# Patient Record
Sex: Male | Born: 1935 | Race: White | Hispanic: No | Marital: Married | Smoking: Former smoker
Health system: Southern US, Community
[De-identification: ages and names within clinical notes are randomized; demographics above are authoritative.]

## PROBLEM LIST (undated history)

## (undated) DIAGNOSIS — E785 Hyperlipidemia, unspecified: Secondary | ICD-10-CM

## (undated) DIAGNOSIS — G4733 Obstructive sleep apnea (adult) (pediatric): Secondary | ICD-10-CM

## (undated) DIAGNOSIS — S065XAA Traumatic subdural hemorrhage with loss of consciousness status unknown, initial encounter: Secondary | ICD-10-CM

## (undated) DIAGNOSIS — I639 Cerebral infarction, unspecified: Secondary | ICD-10-CM

## (undated) DIAGNOSIS — I4891 Unspecified atrial fibrillation: Secondary | ICD-10-CM

## (undated) DIAGNOSIS — I2510 Atherosclerotic heart disease of native coronary artery without angina pectoris: Secondary | ICD-10-CM

## (undated) DIAGNOSIS — H269 Unspecified cataract: Secondary | ICD-10-CM

## (undated) DIAGNOSIS — E119 Type 2 diabetes mellitus without complications: Secondary | ICD-10-CM

## (undated) DIAGNOSIS — I255 Ischemic cardiomyopathy: Secondary | ICD-10-CM

## (undated) DIAGNOSIS — I5022 Chronic systolic (congestive) heart failure: Secondary | ICD-10-CM

## (undated) DIAGNOSIS — S065X9A Traumatic subdural hemorrhage with loss of consciousness of unspecified duration, initial encounter: Secondary | ICD-10-CM

## (undated) DIAGNOSIS — E669 Obesity, unspecified: Secondary | ICD-10-CM

## (undated) DIAGNOSIS — I10 Essential (primary) hypertension: Secondary | ICD-10-CM

## (undated) HISTORY — DX: Traumatic subdural hemorrhage with loss of consciousness of unspecified duration, initial encounter: S06.5X9A

## (undated) HISTORY — PX: CHOLECYSTECTOMY: SHX55

## (undated) HISTORY — DX: Essential (primary) hypertension: I10

## (undated) HISTORY — DX: Ischemic cardiomyopathy: I25.5

## (undated) HISTORY — DX: Obesity, unspecified: E66.9

## (undated) HISTORY — PX: NASAL SEPTUM SURGERY: SHX37

## (undated) HISTORY — DX: Unspecified cataract: H26.9

## (undated) HISTORY — DX: Atherosclerotic heart disease of native coronary artery without angina pectoris: I25.10

## (undated) HISTORY — PX: CORONARY ARTERY BYPASS GRAFT: SHX141

## (undated) HISTORY — PX: HEMORRHOID SURGERY: SHX153

## (undated) HISTORY — PX: OTHER SURGICAL HISTORY: SHX169

## (undated) HISTORY — DX: Cerebral infarction, unspecified: I63.9

## (undated) HISTORY — DX: Hyperlipidemia, unspecified: E78.5

## (undated) HISTORY — DX: Traumatic subdural hemorrhage with loss of consciousness status unknown, initial encounter: S06.5XAA

## (undated) HISTORY — DX: Type 2 diabetes mellitus without complications: E11.9

## (undated) HISTORY — PX: BRAIN SURGERY: SHX531

---

## 1987-10-04 ENCOUNTER — Encounter: Payer: Self-pay | Admitting: Gastroenterology

## 1992-11-20 ENCOUNTER — Encounter: Payer: Self-pay | Admitting: Gastroenterology

## 1999-01-03 ENCOUNTER — Encounter: Payer: Self-pay | Admitting: General Surgery

## 1999-01-04 ENCOUNTER — Ambulatory Visit (HOSPITAL_COMMUNITY): Admission: RE | Admit: 1999-01-04 | Discharge: 1999-01-04 | Payer: Self-pay | Admitting: General Surgery

## 2000-08-20 ENCOUNTER — Encounter: Payer: Self-pay | Admitting: Orthopedic Surgery

## 2000-08-22 ENCOUNTER — Ambulatory Visit (HOSPITAL_COMMUNITY): Admission: RE | Admit: 2000-08-22 | Discharge: 2000-08-22 | Payer: Self-pay | Admitting: Cardiovascular Disease

## 2000-08-25 ENCOUNTER — Inpatient Hospital Stay (HOSPITAL_COMMUNITY): Admission: AD | Admit: 2000-08-25 | Discharge: 2000-08-29 | Payer: Self-pay | Admitting: Orthopedic Surgery

## 2000-12-02 ENCOUNTER — Encounter: Admission: RE | Admit: 2000-12-02 | Discharge: 2001-03-02 | Payer: Self-pay | Admitting: Pulmonary Disease

## 2003-08-29 ENCOUNTER — Inpatient Hospital Stay (HOSPITAL_COMMUNITY): Admission: RE | Admit: 2003-08-29 | Discharge: 2003-09-02 | Payer: Self-pay | Admitting: Orthopedic Surgery

## 2004-04-24 ENCOUNTER — Ambulatory Visit: Payer: Self-pay | Admitting: Pulmonary Disease

## 2004-07-23 ENCOUNTER — Ambulatory Visit: Payer: Self-pay | Admitting: Pulmonary Disease

## 2004-08-02 ENCOUNTER — Ambulatory Visit: Payer: Self-pay | Admitting: Pulmonary Disease

## 2004-11-20 ENCOUNTER — Ambulatory Visit: Payer: Self-pay | Admitting: Pulmonary Disease

## 2005-03-19 ENCOUNTER — Ambulatory Visit: Payer: Self-pay | Admitting: Pulmonary Disease

## 2005-03-29 ENCOUNTER — Ambulatory Visit: Payer: Self-pay | Admitting: Pulmonary Disease

## 2005-07-16 ENCOUNTER — Ambulatory Visit: Payer: Self-pay | Admitting: Pulmonary Disease

## 2005-11-14 ENCOUNTER — Ambulatory Visit: Payer: Self-pay | Admitting: Pulmonary Disease

## 2005-11-15 ENCOUNTER — Ambulatory Visit: Payer: Self-pay | Admitting: Pulmonary Disease

## 2005-11-15 ENCOUNTER — Ambulatory Visit (HOSPITAL_COMMUNITY): Admission: RE | Admit: 2005-11-15 | Discharge: 2005-11-15 | Payer: Self-pay | Admitting: Pulmonary Disease

## 2006-01-13 ENCOUNTER — Ambulatory Visit: Payer: Self-pay | Admitting: Pulmonary Disease

## 2006-01-28 ENCOUNTER — Encounter: Admission: RE | Admit: 2006-01-28 | Discharge: 2006-01-28 | Payer: Self-pay | Admitting: Neurology

## 2006-04-01 ENCOUNTER — Ambulatory Visit: Payer: Self-pay | Admitting: Pulmonary Disease

## 2006-08-05 ENCOUNTER — Ambulatory Visit: Payer: Self-pay | Admitting: Pulmonary Disease

## 2006-08-05 LAB — CONVERTED CEMR LAB
Albumin: 3.4 g/dL — ABNORMAL LOW (ref 3.5–5.2)
Calcium: 9.1 mg/dL (ref 8.4–10.5)
Creatinine, Ser: 1.1 mg/dL (ref 0.4–1.5)
GFR calc non Af Amer: 69 mL/min
Hgb A1c MFr Bld: 7 % — ABNORMAL HIGH (ref 4.6–6.0)
PSA: 0.76 ng/mL (ref 0.10–4.00)
Potassium: 4.7 meq/L (ref 3.5–5.1)
Sodium: 143 meq/L (ref 135–145)

## 2006-09-16 ENCOUNTER — Ambulatory Visit: Payer: Self-pay | Admitting: Internal Medicine

## 2006-09-17 ENCOUNTER — Ambulatory Visit: Payer: Self-pay | Admitting: Gastroenterology

## 2006-10-02 ENCOUNTER — Ambulatory Visit: Payer: Self-pay | Admitting: Gastroenterology

## 2006-10-08 ENCOUNTER — Ambulatory Visit: Payer: Self-pay | Admitting: Gastroenterology

## 2006-10-08 LAB — CONVERTED CEMR LAB
OCCULT 2: NEGATIVE
OCCULT 5: NEGATIVE

## 2006-12-01 ENCOUNTER — Ambulatory Visit: Payer: Self-pay | Admitting: Pulmonary Disease

## 2006-12-01 LAB — CONVERTED CEMR LAB
BUN: 14 mg/dL (ref 6–23)
CO2: 29 meq/L (ref 19–32)
Calcium: 9.4 mg/dL (ref 8.4–10.5)
GFR calc Af Amer: 93 mL/min
GFR calc non Af Amer: 77 mL/min
Potassium: 4.6 meq/L (ref 3.5–5.1)

## 2007-05-11 ENCOUNTER — Ambulatory Visit: Payer: Self-pay | Admitting: Pulmonary Disease

## 2007-06-08 DIAGNOSIS — G4733 Obstructive sleep apnea (adult) (pediatric): Secondary | ICD-10-CM

## 2007-06-08 DIAGNOSIS — J329 Chronic sinusitis, unspecified: Secondary | ICD-10-CM | POA: Insufficient documentation

## 2007-06-08 DIAGNOSIS — Z860100 Personal history of colon polyps, unspecified: Secondary | ICD-10-CM | POA: Insufficient documentation

## 2007-06-08 DIAGNOSIS — E1129 Type 2 diabetes mellitus with other diabetic kidney complication: Secondary | ICD-10-CM | POA: Insufficient documentation

## 2007-06-08 DIAGNOSIS — E7800 Pure hypercholesterolemia, unspecified: Secondary | ICD-10-CM

## 2007-06-08 DIAGNOSIS — J309 Allergic rhinitis, unspecified: Secondary | ICD-10-CM | POA: Insufficient documentation

## 2007-06-09 ENCOUNTER — Ambulatory Visit: Payer: Self-pay | Admitting: Pulmonary Disease

## 2007-06-09 DIAGNOSIS — G319 Degenerative disease of nervous system, unspecified: Secondary | ICD-10-CM

## 2007-06-09 DIAGNOSIS — M1990 Unspecified osteoarthritis, unspecified site: Secondary | ICD-10-CM | POA: Insufficient documentation

## 2007-06-12 ENCOUNTER — Ambulatory Visit: Payer: Self-pay | Admitting: Pulmonary Disease

## 2007-06-21 DIAGNOSIS — I10 Essential (primary) hypertension: Secondary | ICD-10-CM

## 2007-06-21 LAB — CONVERTED CEMR LAB
Basophils Relative: 1.1 % — ABNORMAL HIGH (ref 0.0–1.0)
Bilirubin, Direct: 0.2 mg/dL (ref 0.0–0.3)
CO2: 30 meq/L (ref 19–32)
Cholesterol: 114 mg/dL (ref 0–200)
Creatinine, Ser: 1 mg/dL (ref 0.4–1.5)
HCT: 43.1 % (ref 39.0–52.0)
Hemoglobin: 14.2 g/dL (ref 13.0–17.0)
Hgb A1c MFr Bld: 6.9 % — ABNORMAL HIGH (ref 4.6–6.0)
LDL Cholesterol: 54 mg/dL (ref 0–99)
Lymphocytes Relative: 33.1 % (ref 12.0–46.0)
MCHC: 33 g/dL (ref 30.0–36.0)
Monocytes Absolute: 0.5 10*3/uL (ref 0.2–0.7)
Neutro Abs: 2.8 10*3/uL (ref 1.4–7.7)
Neutrophils Relative %: 54 % (ref 43.0–77.0)
PSA: 0.83 ng/mL (ref 0.10–4.00)
Potassium: 4.7 meq/L (ref 3.5–5.1)
RDW: 13.4 % (ref 11.5–14.6)
Sodium: 141 meq/L (ref 135–145)
Total Bilirubin: 0.7 mg/dL (ref 0.3–1.2)
Total Protein: 7.3 g/dL (ref 6.0–8.3)
VLDL: 13 mg/dL (ref 0–40)

## 2007-06-23 ENCOUNTER — Telehealth: Payer: Self-pay | Admitting: Pulmonary Disease

## 2007-08-07 ENCOUNTER — Telehealth: Payer: Self-pay | Admitting: Internal Medicine

## 2007-09-14 DIAGNOSIS — K5730 Diverticulosis of large intestine without perforation or abscess without bleeding: Secondary | ICD-10-CM | POA: Insufficient documentation

## 2007-09-14 DIAGNOSIS — Z8719 Personal history of other diseases of the digestive system: Secondary | ICD-10-CM

## 2007-09-17 ENCOUNTER — Encounter: Payer: Self-pay | Admitting: Pulmonary Disease

## 2007-10-12 ENCOUNTER — Telehealth: Payer: Self-pay | Admitting: Pulmonary Disease

## 2007-11-02 ENCOUNTER — Encounter: Payer: Self-pay | Admitting: Pulmonary Disease

## 2007-11-09 ENCOUNTER — Ambulatory Visit: Payer: Self-pay | Admitting: Pulmonary Disease

## 2007-11-27 ENCOUNTER — Encounter: Payer: Self-pay | Admitting: Pulmonary Disease

## 2007-12-16 LAB — CONVERTED CEMR LAB
AST: 30 units/L (ref 0–37)
Alkaline Phosphatase: 45 units/L (ref 39–117)
Bilirubin, Direct: 0.1 mg/dL (ref 0.0–0.3)
Total Bilirubin: 0.9 mg/dL (ref 0.3–1.2)
Total CHOL/HDL Ratio: 2.6

## 2008-01-12 ENCOUNTER — Ambulatory Visit: Payer: Self-pay | Admitting: Pulmonary Disease

## 2008-01-16 LAB — CONVERTED CEMR LAB
BUN: 26 mg/dL — ABNORMAL HIGH (ref 6–23)
CO2: 26 meq/L (ref 19–32)
Calcium: 9.4 mg/dL (ref 8.4–10.5)
Creatinine, Ser: 1 mg/dL (ref 0.4–1.5)
Glucose, Bld: 87 mg/dL (ref 70–99)
Sodium: 140 meq/L (ref 135–145)

## 2008-03-05 ENCOUNTER — Inpatient Hospital Stay (HOSPITAL_COMMUNITY): Admission: EM | Admit: 2008-03-05 | Discharge: 2008-04-01 | Payer: Self-pay | Admitting: Emergency Medicine

## 2008-03-05 ENCOUNTER — Ambulatory Visit: Payer: Self-pay | Admitting: Internal Medicine

## 2008-03-08 ENCOUNTER — Ambulatory Visit: Payer: Self-pay | Admitting: Gastroenterology

## 2008-03-10 ENCOUNTER — Ambulatory Visit: Payer: Self-pay | Admitting: Critical Care Medicine

## 2008-03-28 ENCOUNTER — Ambulatory Visit: Payer: Self-pay | Admitting: Physical Medicine & Rehabilitation

## 2008-04-01 ENCOUNTER — Inpatient Hospital Stay (HOSPITAL_COMMUNITY)
Admission: RE | Admit: 2008-04-01 | Discharge: 2008-04-20 | Payer: Self-pay | Admitting: Physical Medicine & Rehabilitation

## 2008-04-01 ENCOUNTER — Encounter: Payer: Self-pay | Admitting: Pulmonary Disease

## 2008-05-03 ENCOUNTER — Encounter: Payer: Self-pay | Admitting: Pulmonary Disease

## 2008-05-16 ENCOUNTER — Encounter: Payer: Self-pay | Admitting: Pulmonary Disease

## 2008-05-24 ENCOUNTER — Telehealth: Payer: Self-pay | Admitting: Pulmonary Disease

## 2008-05-24 ENCOUNTER — Encounter: Payer: Self-pay | Admitting: Pulmonary Disease

## 2008-05-27 ENCOUNTER — Telehealth (INDEPENDENT_AMBULATORY_CARE_PROVIDER_SITE_OTHER): Payer: Self-pay | Admitting: *Deleted

## 2008-06-09 ENCOUNTER — Encounter: Payer: Self-pay | Admitting: Pulmonary Disease

## 2008-06-20 ENCOUNTER — Encounter: Admission: RE | Admit: 2008-06-20 | Discharge: 2008-06-20 | Payer: Self-pay | Admitting: Neurosurgery

## 2008-06-20 ENCOUNTER — Encounter: Payer: Self-pay | Admitting: Pulmonary Disease

## 2008-07-05 ENCOUNTER — Ambulatory Visit: Payer: Self-pay | Admitting: Internal Medicine

## 2008-07-05 ENCOUNTER — Encounter: Payer: Self-pay | Admitting: Pulmonary Disease

## 2008-07-05 DIAGNOSIS — I6200 Nontraumatic subdural hemorrhage, unspecified: Secondary | ICD-10-CM

## 2008-07-12 ENCOUNTER — Telehealth: Payer: Self-pay | Admitting: Pulmonary Disease

## 2008-07-12 LAB — CONVERTED CEMR LAB
BUN: 16 mg/dL (ref 6–23)
Basophils Relative: 1.2 % (ref 0.0–3.0)
Creatinine, Ser: 0.9 mg/dL (ref 0.4–1.5)
Eosinophils Absolute: 0.2 10*3/uL (ref 0.0–0.7)
Eosinophils Relative: 3.5 % (ref 0.0–5.0)
GFR calc Af Amer: 104 mL/min
GFR calc non Af Amer: 86 mL/min
HCT: 40.8 % (ref 39.0–52.0)
MCV: 86.3 fL (ref 78.0–100.0)
Monocytes Absolute: 0.6 10*3/uL (ref 0.1–1.0)
Platelets: 167 10*3/uL (ref 150–400)
Potassium: 3.9 meq/L (ref 3.5–5.1)
WBC: 7.1 10*3/uL (ref 4.5–10.5)

## 2008-07-13 ENCOUNTER — Encounter: Payer: Self-pay | Admitting: Pulmonary Disease

## 2008-07-20 ENCOUNTER — Encounter: Payer: Self-pay | Admitting: Pulmonary Disease

## 2008-07-26 ENCOUNTER — Encounter: Payer: Self-pay | Admitting: Pulmonary Disease

## 2008-08-11 ENCOUNTER — Encounter: Payer: Self-pay | Admitting: Pulmonary Disease

## 2008-09-08 ENCOUNTER — Encounter: Payer: Self-pay | Admitting: Pulmonary Disease

## 2008-11-22 ENCOUNTER — Encounter: Payer: Self-pay | Admitting: Pulmonary Disease

## 2009-01-25 ENCOUNTER — Encounter: Admission: RE | Admit: 2009-01-25 | Discharge: 2009-04-25 | Payer: Self-pay | Admitting: Obstetrics and Gynecology

## 2009-07-06 ENCOUNTER — Ambulatory Visit: Payer: Self-pay | Admitting: Vascular Surgery

## 2010-05-17 IMAGING — CR DG CHEST 1V PORT
1 series · 1 of 1 positions shown · non-contrast
Comparison: 03/10/2008.

CLINICAL DATA: Intubation.

PORTABLE CHEST - 1 VIEW

[view not recorded]
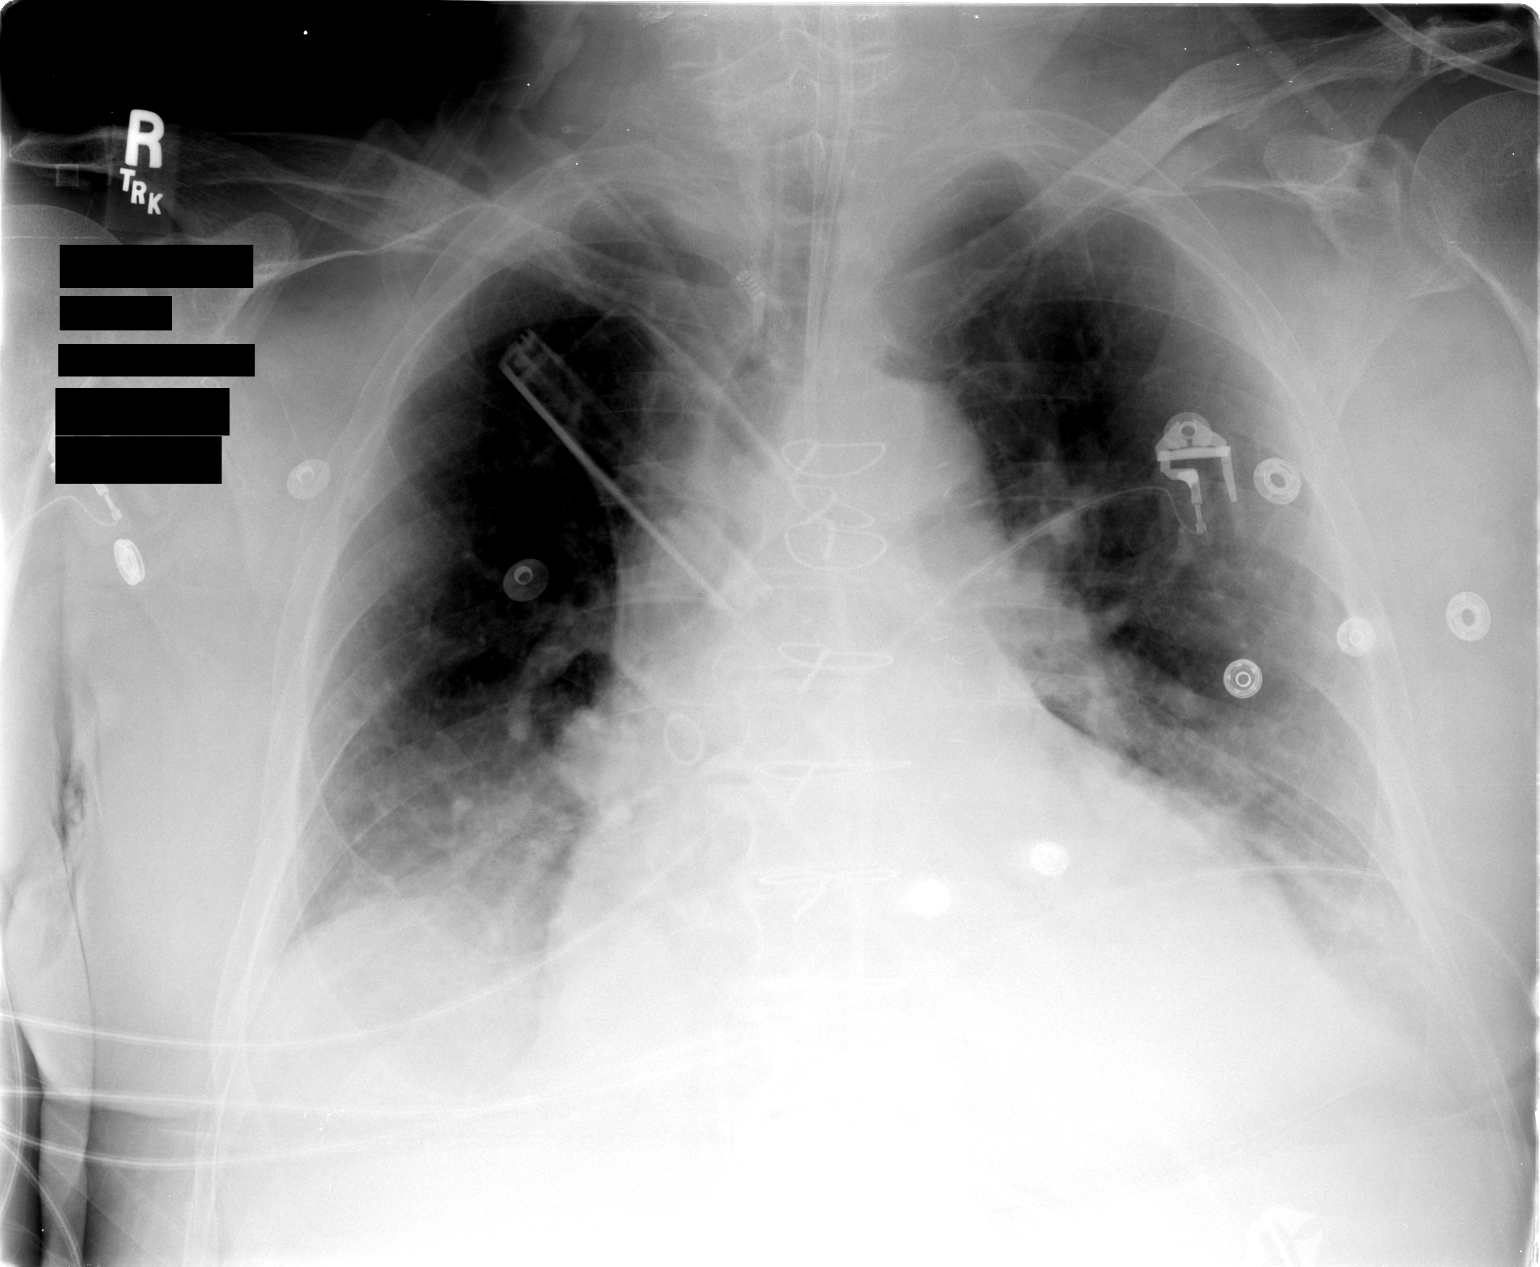

[1 of 1 positions shown; findings below may reference images not displayed]

FINDINGS: Current study performed at 1992 hours.  Endotracheal tube
is in good position.  There is increase in bibasilar atelectasis
compared with the prior study.  There is COPD.  There is vascular
congestion.
IMPRESSION: Endotracheal tube in good position

Increase in bibasilar atelectasis.

## 2010-05-19 IMAGING — CR DG CHEST 1V PORT
1 series · 1 of 1 positions shown · non-contrast
Comparison: 03/11/2008

CLINICAL DATA: Subdural hematoma.

PORTABLE CHEST - 1 VIEW

[view not recorded]
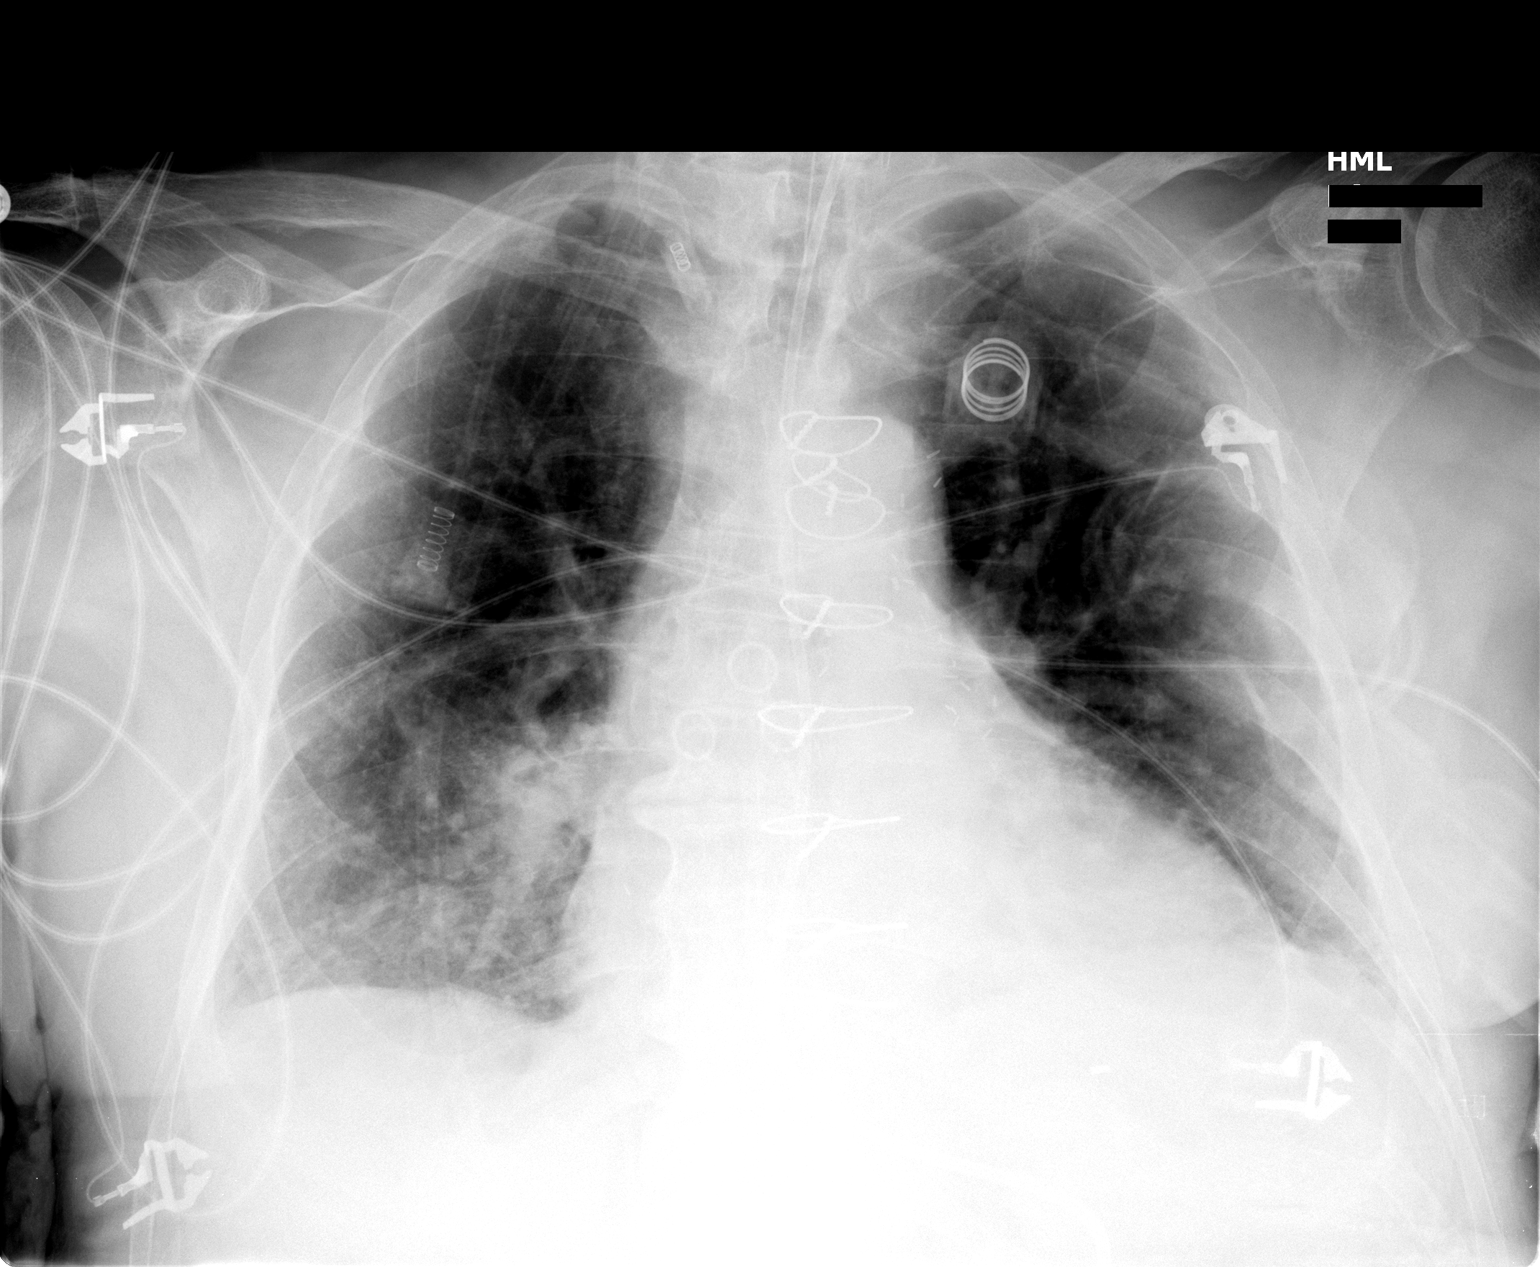

[1 of 1 positions shown; findings below may reference images not displayed]

FINDINGS: Portable view of the chest demonstrates an endotracheal
tube and feeding tube.  The endotracheal tube lies above the
clavicular heads.  The patient continues to have bibasilar
densities that may represent atelectasis or even mild edema.  Heart
size remains upper limits of normal.  The patient is status post
median sternotomy and CABG procedure.
IMPRESSION: Stable chest radiograph findings with basilar densities.

## 2010-05-19 IMAGING — CT CT HEAD W/O CM
1 series · 15 of 30 positions shown, 19 images · non-contrast
Comparison: 03/08/2008.

CLINICAL DATA: Follow-up left subdural hematoma.

CT HEAD WITHOUT CONTRAST
TECHNIQUE: Contiguous axial images were obtained from the base of
the skull through the vertex without contrast.

[Series 2: head routine 4.8 h37s · axial · 0.46mm/px · z∈[+1120,+1285]mm · 15 of 36 slices shown, 19 images]
[im 2/36  brain]
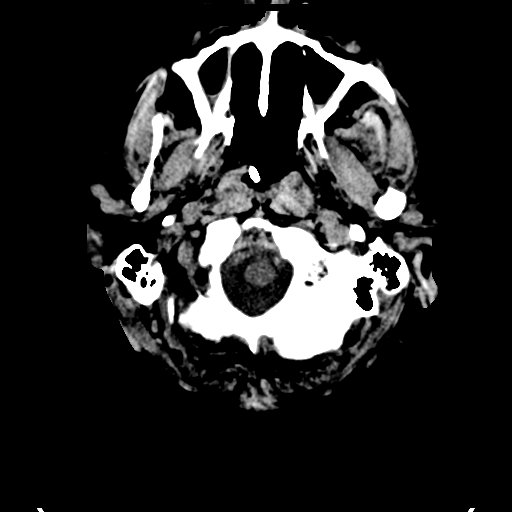
[im 2/36  bone]
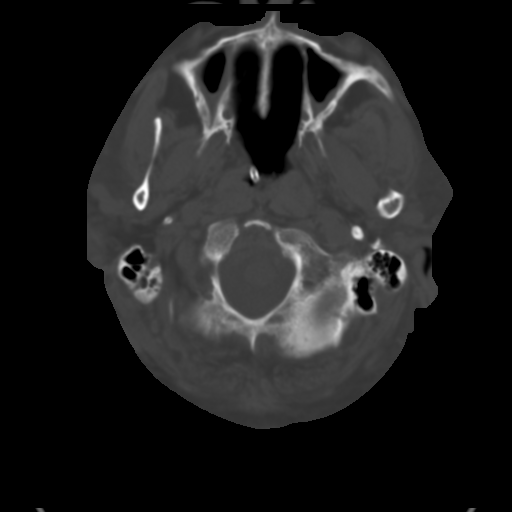
[im 4/36  brain]
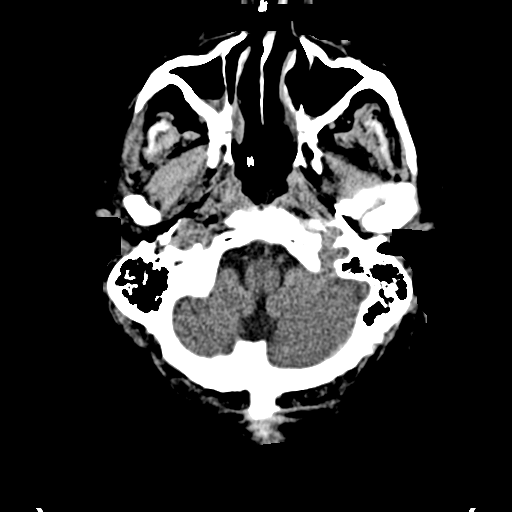
[im 7/36  brain]
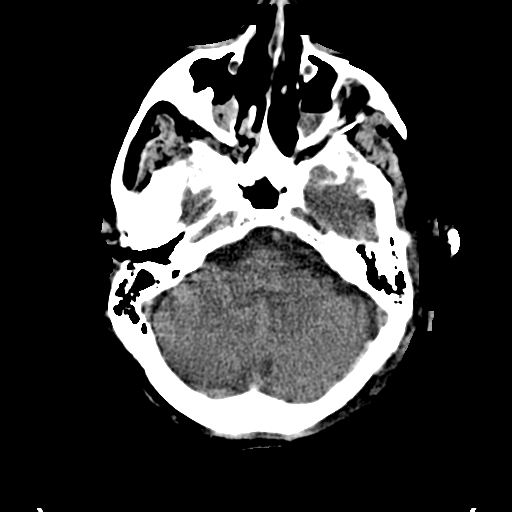
[im 9/36  brain]
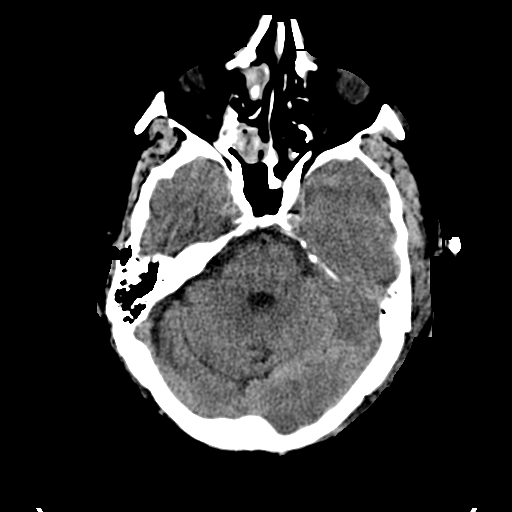
[im 11/36  brain]
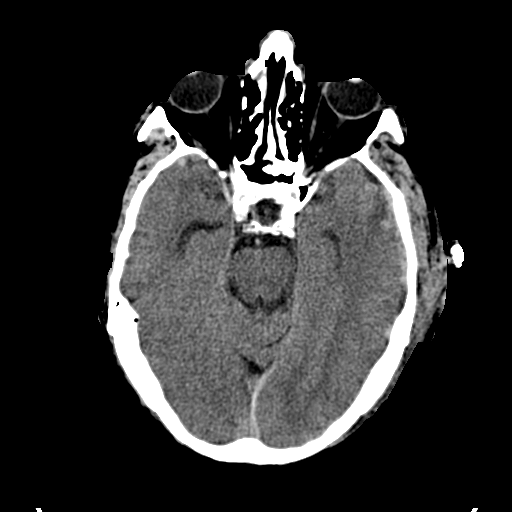
[im 11/36  bone]
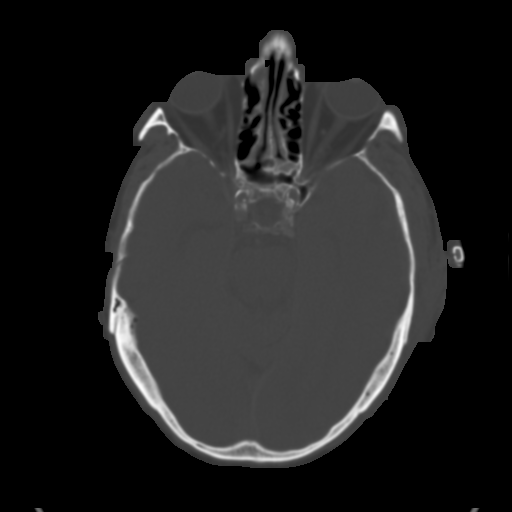
[im 14/36  brain]
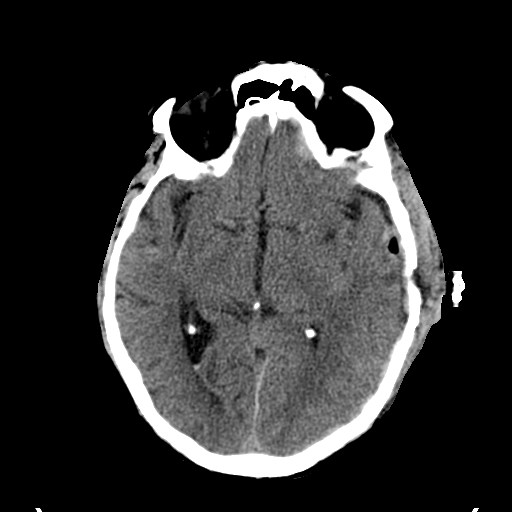
[im 16/36  brain]
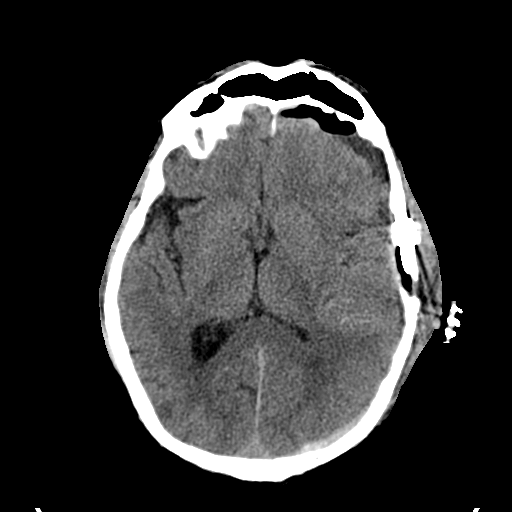
[im 19/36  brain]
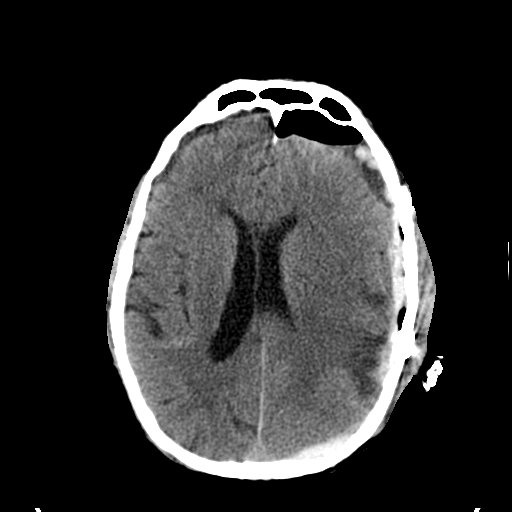
[im 20/36  brain]
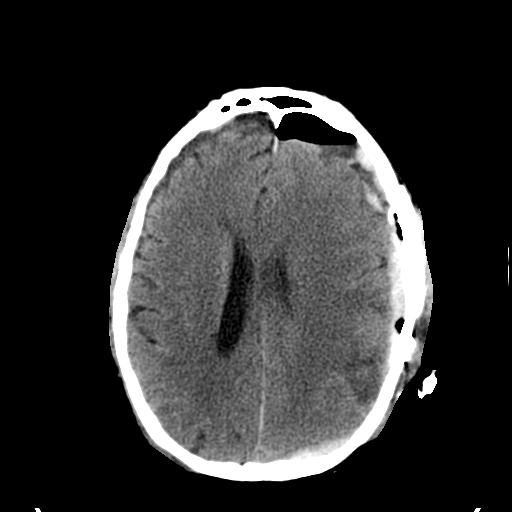
[im 20/36  bone]
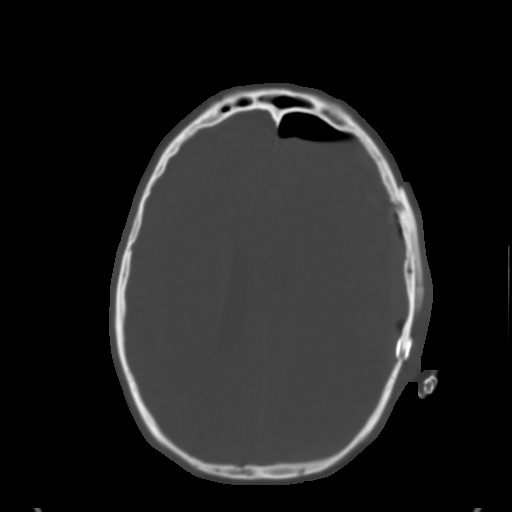
[im 22/36  brain]
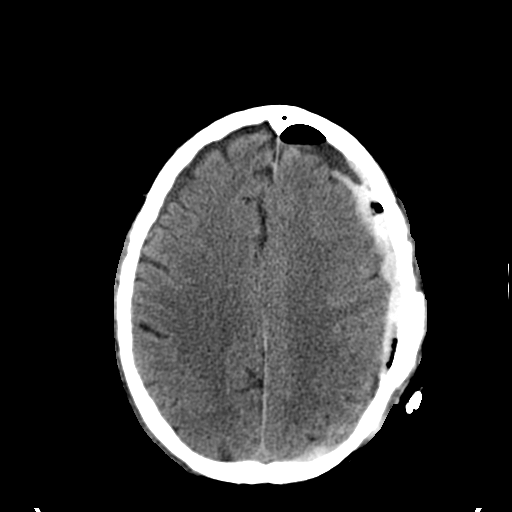
[im 25/36  brain]
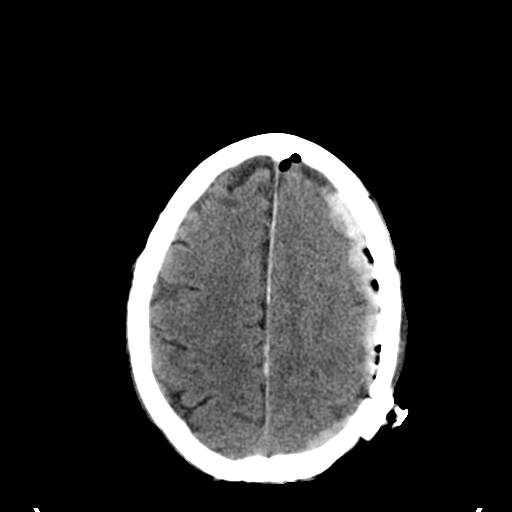
[im 27/36  brain]
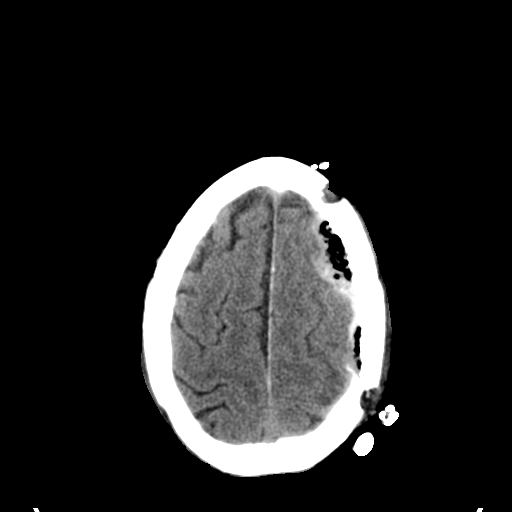
[im 29/36  brain]
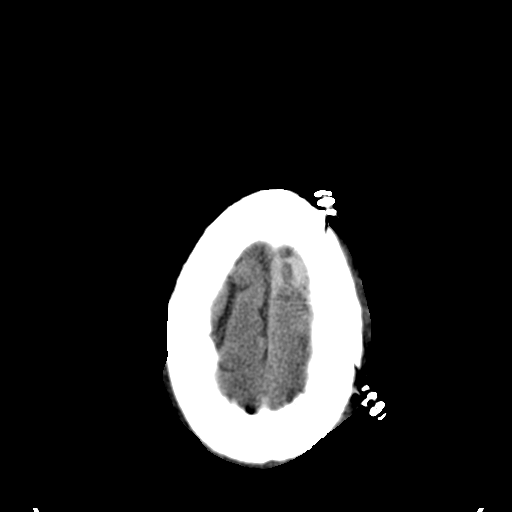
[im 29/36  bone]
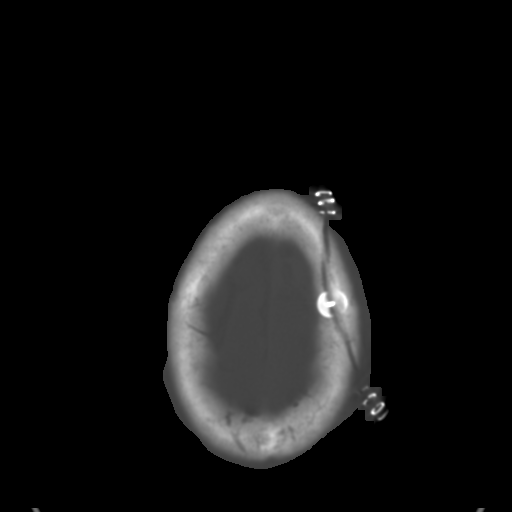
[im 32/36  brain]
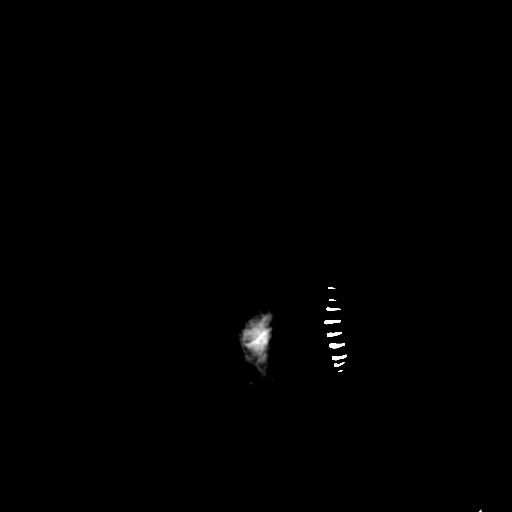
[im 34/36  brain]
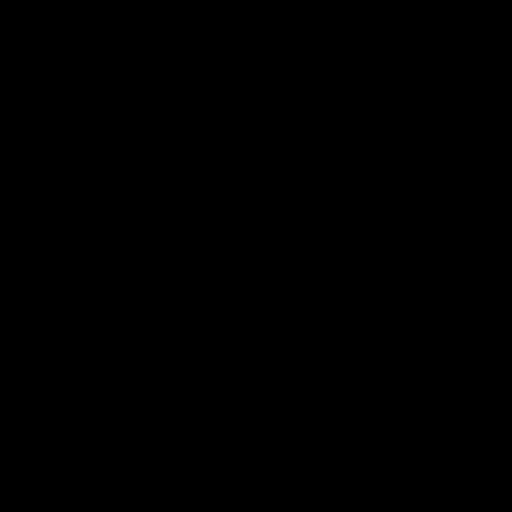

[15 of 30 positions shown; findings below may reference images not displayed]

FINDINGS: Interval post craniotomy changes on the left with
evacuation of a portion of the previously demonstrated left
subdural hematoma.  There is persistent subdural blood and air.
Laterally, the blood measures 9 mm in maximum thickness.
Posteriorly, this measures 6 mm in maximum thickness.  There is 6
mm of midline shift from left to right.  There is significantly
less bilateral subarachnoid blood.  A tiny amount of blood is noted
in the occipital horn of the right lateral ventricle.  Edema is
noted in the left cerebral hemisphere, involving all lobes.  There
is also patchy white matter low density on the right and mild
enlargement of the ventricles and subarachnoid spaces.  Bilateral
ethmoid and maxillary sinus mucosal thickening is again noted with
air-fluid levels noted in the maxillary sinuses bilaterally.
IMPRESSION: 1.  Interval left craniotomy with evacuation of a portion of the
left subdural hematoma with residual subdural blood and air, as
described above.
2.  Decreased shift of midline structures from left to right,
currently measuring 6 mm.
3.  Decreased subarachnoid blood.
4.  Tiny amount of intraventricular hemorrhage.
5.  Diffuse left cerebral hemisphere edema.
6.  Mild diffuse cerebral atrophy and chronic small vessel white
matter ischemic changes.
7.  Stable sinusitis.

## 2010-05-20 ENCOUNTER — Encounter: Payer: Self-pay | Admitting: Pulmonary Disease

## 2010-05-20 ENCOUNTER — Encounter: Payer: Self-pay | Admitting: Physical Medicine & Rehabilitation

## 2010-09-10 ENCOUNTER — Ambulatory Visit (HOSPITAL_COMMUNITY)
Admission: RE | Admit: 2010-09-10 | Discharge: 2010-09-10 | Disposition: A | Discharge status: Home / Self Care | Payer: MEDICARE | Source: Ambulatory Visit | Attending: Nurse Practitioner | Admitting: Nurse Practitioner

## 2010-09-10 ENCOUNTER — Other Ambulatory Visit: Payer: Self-pay | Admitting: Nurse Practitioner

## 2010-09-10 DIAGNOSIS — R52 Pain, unspecified: Secondary | ICD-10-CM

## 2010-09-10 DIAGNOSIS — M79609 Pain in unspecified limb: Secondary | ICD-10-CM | POA: Insufficient documentation

## 2010-09-11 NOTE — Procedures (Signed)
REFERRING PHYSICIAN:  Allen Derry, MD   CLINICAL HISTORY:  A Taylor Jacobson being evaluated.  The Jacobson with  subdural hematoma.   MEDICATIONS LISTED:  Ventolin, Protonix, alprazolam, Lanoxin, NovoLog,  Lopressor, Lantus, Keppra, morphine, Catapres, Tylenol, and Versed.   This is a 17-channel portable EEG recorded.  The Jacobson described as  being unresponsive.   The background awake rhythm consists of 7-8 Hz alpha which is diminished  amplitude reactive.  Intermittent 6-7 Hz theta slowing is seen  bilaterally in diffuse distribution intermittently.  There is mild left-  to-right amplitude symmetry with diminished amplitude on the left noted.  No paroxysmal epileptiform activity spikes or sharp waves are seen.  Photic stimulation is unremarkable.  Hyperventilation is not performed.  Length of the recording is 21.6 minutes.  Technical component is  average.   IMPRESSION:  This portable EEG is abnormal due to presence of the left  hemispheric slowing, which is indicative of underlying structural  lesion, as well as mild bihemispheric slowing, but no definite  epileptiform features are noted.           ______________________________  Sunny Schlein. Pearlean Brownie, MD     EAV:[id number]  D:  03/17/2008 17:03:25  T:  03/18/2008 04:15:37  Job #:  [id number]

## 2010-09-11 NOTE — H&P (Signed)
NAME:  WILHELM, GANAWAY NO.:  [id number]   MEDICAL RECORD NO.:  [id number]          PATIENT TYPE:  INP   LOCATION:  1237                         FACILITY:  [facility]   PHYSICIAN:  Nelda Bucks, MD DATE OF BIRTH:  1927/03/31   DATE OF ADMISSION:  03/05/2008  DATE OF DISCHARGE:                              HISTORY & PHYSICAL   HISTORY OF PRESENT ILLNESS:  This is a 77 year old white male with a  history of coronary artery disease on Plavix who presented at 5 p.m.  having fallen at a car dealership at [place].  He complained of a  headache and some nuchal rigidity and had some loss of consciousness  although less than 30 seconds as described.  He denied nausea and  vomiting upon admission to the emergency room.  He was taken by  ambulance from the car dealership with his wife and presented to the  [facility] emergency room.  Upon arrival he was evaluated by  the emergency room doctor and noted to be speaking full sentences but  somewhat lethargic.  Went in to obtain a CAT scan and post CAT scan  found to have a sudden worsening change in mental status unresponsive  state.  The patient required intubation in the emergency room for airway  protection.  CAT scan of the head revealed a large left subdural  hematoma with subarachnoid blood extension with 15-18 mm shift and mass  effect.  Neurosurgery, Dr. Aliene Beams, was contacted and noted this  patient was not an operative candidate based on his use of Plavix and  current CAT scan findings and current clinical findings.  We are asked  to admit this patient to the intensive care unit.   ALLERGIES:  No known drug allergies.   PAST MEDICAL AND SURGICAL HISTORY:  1. Diverticulosis.  2. CHF.  3. Hypertension.  4. Status post CABG.  5. Hypercholesterolemia.  6. Prior knee surgery.  7. OSA seen by Dr. Jetty Duhamel requiring nocturnal CPAP therapy at 5      with humidification.   MEDICATIONS:  1.  Wife only remembers Avelox and Plavix although if you read his most      recent discharge summary it lists metoprolol.  2. Allegra.  3. Enalapril.  4. Digoxin.  5. Aspirin.  6. Vytorin  7. Niaspan  8. Plavix 75 mg p.o. daily.  9. Actos 30 mg.  10.Glyburide/metformin combination.   SOCIAL HISTORY:  He is married, has 3 children, has 3 grandchildren.  He  denies smoking and drug abuse.   FAMILY HISTORY:  Noncontributory.   REVIEW OF SYSTEMS:  Unobtainable although has been in his normal state  of health recently, able to feed himself, not getting around well but  that is at his baseline, but no recent abdominal pain, chest pain or  burning with urination.   PHYSICAL EXAMINATION:  VITAL SIGNS:  Blood pressure of 190/87,  temperature is 98.2 degrees Fahrenheit, 98% saturation on the ventilator  machine, respiratory rate set at 12, breathing 16 on the vent with a  pulse of 79.  GENERAL:  Inspection revealed this patient to be unresponsive requiring  mechanical ventilation.  HEENT:  Showed no bruits in the neck, endotracheal tube is present.  Trachea is midline.  LUNGS:  Show clear to auscultation with slight coarseness at the bases.  HEART:  S1, S2, regular rate and rhythm.  No rubs, no gallops but there  is a 2/6 systolic murmur heard best at the base.  Nonradiating.  ABDOMEN:  Examination shows belly was soft, obese.  Bowel sounds were  present, nontender.  EXTREMITIES:  Examination showed no significant edema.  CENTRAL NERVOUS SYSTEM:  The patient is unresponsive to painful stimuli.  He is breathing 4 breaths above the ventilator set rate of 12.  He does  not move his lower extremities to pain.  He moves his upper extremities  to significant stimuli/pain with minimal movement but does not follow  any commands.  He does cough with suctioning.  He does not have a gag  present.  He has pupils that are equally round at 1 mm but clearly  unreactive.   Laboratory examination shows  CT scan of the head with a large left  subdural hematoma with mass effect from 15-18 mm of shift with  subarachnoid hemorrhage extension.  White blood cell count of 7.4,  hemoglobin 13.9, hematocrit 41.8 with a platelet count of 142.  CT scan  of the neck was also done which just showed degenerative disease but  negative for fracture.  Chemistry panel is all within normal limits  except for a glucose of 215.  His calcium is 9.1.  His troponin is  negative at 0.05.  His coags are all within normal limits.  Chest x-ray  shows some interstitial changes with some CABG sternal wiring and the  endotracheal tube at 3 cm above the carina.  EKG shows sinus  tachycardia, rate of 55 with atrial fibrillation with an isolated T wave  in 3 with some mild ST segment depression in V2, V3 and V4 and no  significant ST segment elevation with some moderate left ventricular  hypertrophy.   ASSESSMENT:  1. Large traumatic subdural hematoma with shift and subarachnoid      extension on Plavix.  2. Acute respiratory failure type IV.  3. History of hypertension with some hypertensive emergency present at      this time.  4. No code blue status.  Please see below comments.  5. Diabetes/hypertension/coronary artery disease/coronary artery      bypass graft/obstructive sleep apnea.   PLAN:  Per systems approach neurology, as patient noted through the  emergency room doctors and clear discussion with Dr. Gerlene Fee stating he  is not a surgical candidate.  This gives him a very poor prognosis in  itself of the extent of the subdural hematoma, subarachnoid extension  and the shift and not being a surgical candidate this most likely will  be fatal.  He will have his head elevated at 35 degrees.  We will  control blood pressure.  See cardiovascular section for details.  We  will make the consideration for discontinuation of life support up to  the family including wife and daughter in full.  They are fully aware of   the poor prognosis and do not want to have any further aggressive heroic  measures for this patient.  There are two sons, one coming from  [place] who are on their way and they want to support the patient  until their arrival.  Coagulation panel at this time  is normal.  Platelet count is also within normal limits.  No further CT scans will  be obtained given the poor prognosis and there will be no surgical  intervention.   RESPIRATORY:  The patient will be set at a tidal volume 550, rate of 12,  100%, PEEP of 5, ABG will be obtained.  Portable chest x-ray has been  reviewed.  Endotracheal tube is well placed.  Will be consideration for  discontinuation of life support in the morning time.  At that time we  will assess his ability to breathe over the ventilator machine.   CARDIOVASCULAR:  The patient will be maintained on telemetry monitoring.  We will reduce his mean arterial pressure by 25% of his highest mean  arterial pressure upon admission if required.  The patient (sic) does  not want any pressors or continuous infusions for this patient at this  time.   RENAL:  The patient will be given normal saline, free water will be  avoided.   GI:  The patient will be kept n.p.o.  Proton pump inhibitor will be  administered.   ID:  There is no aspiration or infectious etiology at this time noted.   HEMATOLOGY:  SCDs will be provided for this patient, hematocrit is  stable, coagulation panel is normal.  Of course subcu heparin and  Lovenox are contraindicated.   ENDOCRINE:  This patient's family members do not require aggressive  heroic measures.  Glucose is elevated but clearly they do not want him  having frequent blood sticks.  The patient is critically ill and  required 50 minutes of my constant attention and care.      Nelda Bucks, MD  Electronically Signed     DJF/MEDQ  D:  03/05/2008  T:  03/06/2008  Job:  [id number]   cc:   Joni Fears D. Maple Hudson, MD, FCCP, FACP   [facility]  [address]  [place]  [place] [address]   C. Lesia Sago, M.D.  Fax: [phone]   Vania Rea. Jarold Motto, MD, FACG, FACP, FAGA  [address]  [place]  [place] [address]

## 2010-09-11 NOTE — Consult Note (Signed)
NEW PATIENT CONSULTATION   FALON, FLINCHUM  DOB:  14-Jun-1926                                       07/06/2009  ZOXWR#:[id number]   I saw the patient in the office today in consultation concerning his  peripheral vascular disease and wound on his left great toe.  This is a  pleasant 77 year old gentleman who had a toenail removed from his left  great toe approximately 6 weeks ago.  This has been slow to heal and for  this reason he was sent for vascular consultation by Dr. Suzette Battiest.  The  toenail has been slow to improve but appears to be gradually making  progress according to the daughter.  He has had some small amount of  drainage from the toenail bed.  He has had no fever or chills.  He has  had minimal pain associated with the wound on the left great toenail  bed.  Of note, I do not get any history of claudication although his  activity is fairly limited.  He has no history of rest pain.   PAST MEDICAL HISTORY:  Significant for an intracranial bleed; in  November of 2009 he fell and had a large left subdural hematoma and was  ultimately operated upon for this.  He has done well from that  standpoint.  In addition, the patient has a history of coronary artery  disease and underwent coronary revascularization in 1997 with vein taken  from the left leg.  The patient also has adult onset diabetes and does  require insulin.  In addition, he has hypertension and  hypercholesterolemia, all of which are well-controlled on his current  medications and are followed by Dr. Elana Alm.  He denies any history of  previous myocardial infarction or history of COPD.  He does have  obesity.   FAMILY HISTORY:  There is no history of premature cardiovascular  disease.   SOCIAL HISTORY:  He is married.  He has three children.  He does not use  tobacco and does not drink alcohol on a regular basis.   REVIEW OF SYSTEMS:  GENERAL:  He has had no recent weight loss, weight  gain  or problems with his appetite.  He is 245 pounds, 5 feet 8-1/2  inches tall.  CARDIOVASCULAR:  He has occasional chest pressure although this sounds  like musculoskeletal pain as it is related to bending over.  He has some  orthopnea and some dyspnea on exertion.  He has had no history of DVT or  phlebitis.  He has had no history of stroke, TIAs or amaurosis fugax.  PULMONARY:  He occasionally uses home O2.  He has had no recent cough,  bronchitis, asthma or wheezing.  GI:  He does have a history of reflux.  He has had no recent change in  his bowel habits.  GU, psychiatric, hematologic and integumentary review of systems is  unremarkable.  NEUROLOGIC:  He has occasional dizziness.  He has had no blackouts,  headaches or seizures.  MUSCULOSKELETAL:  He has arthritis and joint pain.  ENT:  He has had some loss of hearing over the last 2 years.   PHYSICAL EXAMINATION:  General:  This is a pleasant 77 year old  gentleman who appears his stated age.  Vital signs:  His blood pressure  is 124/70, saturation 98%, heart rate is 54.  HEENT:  He has a healed  incision in his skull on the left.  Otherwise HEENT is unremarkable.  Lungs:  Clear bilaterally to auscultation without rales, rhonchi or  wheezing.  Cardiovascular:  I do not detect any carotid bruits.  He has  a regular rate and rhythm without murmur appreciated.  He has palpable  femoral, popliteal, dorsalis pedis and posterior tibial pulses  bilaterally.  Abdomen:  Soft and nontender.  It is difficult to assess  for aneurysm given his size.  He has normal pitched bowel sounds.  No  masses are appreciated.  Musculoskeletal:  There are no major  deformities or cyanosis.  Neurological:  He has no focal weakness or  paresthesias.  Skin:  He does have a slowly healing wound on his left  great toenail bed.  Lymphatic:  He has no significant cervical, axillary  or inguinal lymphadenopathy.   I did independently interpret his arterial Doppler  study today which  shows an ABI of 100% bilaterally.  He has biphasic Doppler signals in  the right posterior tibial and anterior tibial positions and biphasic  signals in the left anterior tibial and posterior tibial positions.  Toe  pressures were 71 mmHg on the right and 79 mmHg on the left.   Based on his exam and Doppler studies and toe pressures he should have  adequate circulation for healing the wound on his left great toe.  Certainly with his diabetes this could be compromising healing.  However, from a vascular standpoint he needs no further workup and  appears to have good arterial flow.  He does have some varicose veins  bilaterally with evidence of some mild chronic venous insufficiency but  this should not interfere significantly with healing.  We will see him  back p.r.n.     Di Kindle. Edilia Bo, M.D.  Electronically Signed   CSD/MEDQ  D:  07/06/2009  T:  07/07/2009  Job:  [id number]   cc:   Gala Murdoch. Kyra Manges, M.D.

## 2010-09-11 NOTE — Discharge Summary (Signed)
NAME:  Taylor Jacobson, Taylor Jacobson NO.:  [id number]   MEDICAL RECORD NO.:  [id number]          PATIENT TYPE:  IPS   LOCATION:  4031                         FACILITY:  [facility]   PHYSICIAN:  Ellwood Dense, M.D.   DATE OF BIRTH:  10/01/1926   DATE OF ADMISSION:  04/01/2008  DATE OF DISCHARGE:                               DISCHARGE SUMMARY   DISCHARGE DIAGNOSES:  1. Large left subdural hemorrhage with associated subarachnoid      hemorrhage and small subdural hemorrhage right frontal region      secondary to PBI.  2. Diabetes mellitus type 2.  3. Coronary artery disease with history of arrhythmia.  4. Seizure onset secondary to subdural hemorrhage.  5. Dysphasia, improving.  6. Obstructive sleep apnea.  7. Urinary retention due to neurogenic bladder.   HISTORY OF PRESENT ILLNESS:  Taylor Jacobson is a 77 year old male with  history of coronary artery disease and obstructive sleep apnea who [place]  [place] [employer] [place] 05, 2008.  CT of head done past  admission showed large subdural hemorrhage with subarachnoid hemorrhage  and left to right shift.  The patient was noted to have decrease in  mental status changes post CT of head requiring intubation.  Surgery  delayed secondary to the patient on Plavix prior to admission.  On  March 11, 2008, the patient underwent left frontotemporal parietal  cranium by Dr. Gerlene Fee.  The patient was noted to have issues with  obtundation secondary to sepsis due to Pseudomonas bacteremia and UTI,  as well as Proteus __________.  He was treated with IV Primaxin for  this.  He did develop diarrhea, however, C diff checked x3 have been  negative.  The patient was noted to have seizure activity on March 09, 2008, and was treated with Keppra.  EEG on March 09, 2008, showed  left brain slowing with episode of sharp activity left hemisphere,  question focal seizure.  Repeat EEG on March 17, 2008, was done due  to decrease in mental  status and no seizure activity noted on this.  The  patient was extubated without difficulty.  Swallow eval done revealed  severe oropharyngeal dysphagia with aspiration suspected of nectar  liquids.  The patient was started on D1 diet with honey thick liquids.  Foley was discontinued, however, the patient is reported to have voiding  difficulties with issues with incontinence and one PVR checked revealing  volumes at 800 mL.  Therapies were initiated.  The patient is noted to  have decrease in posture with difficulty advancing right lower  extremity.  He is noted to have word finding problems with question  expressive greater than receptive aphasia.  The patient was evaluated by  the rehab physicians who felt he could benefit from rehab stay and the  patient was admitted on April 01, 2008, for CIR program.   REVIEW OF SYSTEMS:  Positive for weakness, as well as recent seizure  onset, issues with retention with bladder incontinence.   PAST MEDICAL HISTORY:  1. DM type 2.  2. Coronary artery disease with  CABG in 1997.  3. Obstructive sleep apnea with CPAP use at home.  4. History of CHF.  5. Hypertension.  6. Dyslipidemia.  7. History of arrhythmia.  8. Allergic rhinitis.  9. Obesity.  10.Colon polyps.  11.Bilateral total knee replacement.  12.Cholecystectomy.  13.Diverticulosis.  14.Hemorrhoidal surgery.  15.History of nasal septoplasty.   ALLERGIES:  1. ZOSYN.  2. EPINEPHRINE.   FAMILY HISTORY:  Noncontributory, unable to elicit currently.   SOCIAL HISTORY:  The patient is married, lives in a one-level home with  no steps at entry.  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  He does  not use any tobacco or alcohol.  Wife to provide supervision past  discharge.   FUNCTIONAL HISTORY:  The patient was independent without assisted  device, question history of gait abnormality.   FUNCTIONAL STATUS:  The patient needs hand over hand assist to initiate  self-feeding, min  to max assist to sit edge of the bed, max assist for  stand, max assist __________total assist, 40% for transfers.   PHYSICAL EXAMINATION:  GENERAL:  The patient is an obese pleasant  appearing male with some lethargy, however, easily arousable and is able  to follow simple commands.  HEENT:  Pupils equal, round and reactive to light.  Atraumatic.  Cranial  incision is noted to be clean and dry.  Nares are patent.  Moist oral  mucosa with fair dentition.  NECK:  Supple without JVD or lymphadenopathy.  CHEST:  Clear to auscultation bilaterally without wheezes, rales or  rhonchi.  HEART:  Regular rate and rhythm without murmurs or gallops.  ABDOMEN:  Soft, nontender with active bowel sounds.  Rectal tube was  noted to be in place at admission.  EXTREMITIES:  No edema noted.  Onychomycosis of nails on bilateral upper  and lower extremities.  Noted to have some pain with range of motion  right knee.  NEUROLOGIC:  The patient is an alert male, flat affect with decreased  balance, tendency to lean to the left in bed.  Vocal quality weak.  Speech dysarthric.  Reflexes decreased.  Sensation generally intact.  Judgment, orientation, memory and mood are profoundly effected.  The  patient does move all four, left slightly more than right.  Able to  follow one-step commands with increased time.   HOSPITAL COURSE:  Mr. Taylor Jacobson was admitted to rehab on April 01, 2008, for inpatient therapies to consist of PT, OT and speech therapy at  least 3 hours 5 days a week.  Past admission, physiatrist, 24-hour rehab  RN and therapy team had worked together to provide customized  collaborative interdisciplinary care.  They have held weekly conferences  to monitor the patient's progress, set goals, as well as discover  barriers to discharge.  Rehab RN has been assisting with skin care with  prevention of pressure area.  They have also been assisting with  monitoring of his cranial wound which is noted  be healing well.  Bowel  and bladder training was also initiated.  Rectal tube was discontinued  past admission.  The patient was noted to have a condom cath at  admission.  PVRs check showed volumes at 900 mL.  Secondary to extremely  poor mobility, a Foley was placed to straight drain for now.  The  patient was started on IV fluids nightly to prevent dehydration as  patient on D1 diet with honey thick liquids.  The patient's renal status  has been closely monitored along while on  honey thick liquids.  Labs  done on April 04, 2008, revealed hemoglobin 11.7, hematocrit 36.3,  white count 5.3, platelets 128.  Check of lytes revealed sodium 141,  potassium 3.2, chloride 107, CO2 of 25, BUN 18, creatinine 0.59, glucose  66.  The patient has had some intermission issues with hypokalemia which  has been supplemented briefly.  He was maintained on IV fluids while on  honey thick liquids.  Speech therapy has been following the patient for  dysphasia treatment.  Repeat swallow study was done on April 08, 2008, and the patient was advanced to D2 diet, thin liquids with full  supervision, no straws.  At this time, the patient's IV fluids were  discontinued and hydration was monitored along closely.  Labs on  April 11, 2008, showed BUN at 9, creatinine 0.72.  Rehab RN has been  assisting in pushing the patient's p.o. fluids to make sure the patient  maintains his hydration status.  Recheck lytes from __________showed  renal status to be stable.  Lytes on April 13, 2008, showed recurrent  hypokalemia with potassium at 3.3.  This was supplemented briefly.  Recheck labs on April 16, 2008, revealed sodium 138, potassium 3.5,  chloride 104, CO2 of 26, BUN 9, creatinine 0.68.  The patient continues  on Keppra b.i.d. for seizure prophylaxis and he has been seizure-free  during this stay.  The patient's blood pressures have been checked on  b.i.d. basis during this stay.  These have ranged  from 120s-140s  systolic, 70s-80s diastolic.  The patient's blood sugars have been  monitored on a.c./h.s. CBG checks.  These are noted to be ranging with  occasional low in 70s to 160s range.  An h.s. snack was added to help  prevent hypoglycemia in a.m.  The patient continues on CPAP h.s. at 5 cm  H20, and is tolerating this without difficulty.   On past admission, PT, OT, speech therapy eval noted the patient with  generalized weakness with abnormal flexed posture, poor postural control  limited by range of motion with right side weaker than left, aphasia  with cognitive deficits with poor exercise tolerance.  The patient  closing his eyes frequently.  Required total assist plus 2 for mobility  with mechanical lift to transfer out of bed.  He was also noted to  reveal decreased initiation with apraxia and slow processing with his  ADL tasks.  He required max assist for supine to sitting with max assist  with cues for bathing with difficulty in motor planning, min assist for  brushing his teeth.  PT, OT and speech therapy have been addressing  mobility and safety issues with the patient and wife and recommended to  them that the patient would require a lot more assist at home past  discharge depending on initial evaluation.  Speech therapy evaluation  revealed the patient at 70% accuracy for basic yes and no questions.  He  required max assist for self-feeding.  He was 50% accurate for complex  information for yes and no questions.  Mod to max assist for  conversational discourse.  Verbal expression required mod to max assist  for responsive naming, max assist for expression of needs and wants.  Required total assist to match words to pictures.  He was able to attend  to his breakfast with mod to max assist.  Mod to max assist for  immediate memory, max assist for working memory, as well as for complex  information.  He was  noted to have significant cognitive linguistic  impairments  impacting his functional communication, his basic needs and  overall safety function.  Currently, speech therapy has been working  with the patient for aspiration precautions for tolerating D2 thin  liquids.  He is currently tolerating this diet without any signs or  symptoms of aspiration.  He is able to formulate phrase with stimuli  with mod assist.  He requires occasional cues to increase vocal  intensity with min assist.  He requires mod assist for meal setup and  occasional assist for self-feeding.  He requires max assist for  recalling details of fall and need for surgery.  He has been working  with bathing and dressing at sink with verbal cuing for sequencing and  task organization.  They have been working on the use of right upper  extremity for self-feeding, as well as on sitting balance with head in  upright position.  Currently, the patient is mod to max assist for  transfers to bedside commode, mod assist for sit to stand at sink with  bilateral upper extremity supported on sink.  He is showing increased  attention to tasks with increase in task organization.  He is able to  doff socks with extra time with focus on threading pants with extra time  with verbal and tactile cues to use bilateral upper extremity and to  increase use of right hand as dominant hand.  [employer] is also  focusing on use of bilateral upper extremities to setup meals and use of  left upper extremity and right upper extremity for eating.  Last PT note  revealed the patient at max assist for bed mobility with difficulty in  weight shifting, total assist 35-45%, progressing from scoot pivot to  squat pivot transfers.  Standing balance using upper extremity support  is at total assist plus 2 at 55-60%.  Currently due to the patient's  continued impairments in mobility and self-care, as well as the  patient's family's inability to provide the amount of assistance needed,  they have elected on SNF.  Bed is  available at [facility] for  April 20, 2008, and the patient is to be discharged to this facility  with progressive PT, OT and speech therapy to continue past discharge.   DISCHARGE MEDICATIONS:  1. Senokot-S 2 p.o. nightly.  2. Protonix 40 mg p.o. per day.  3. Claritin 10 mg p.o. per day.  4. Eucerin cream to bilateral hands and feet q.a.m.  5. Dulcolax suppository 1 per day if no BM.  6. Beneprotein supplements once scoop t.i.d.  7. Catapres 0.1 mg p.o. b.i.d.  8. Keppra 500 mg p.o. b.i.d.  9. Lantus insulin 40 units subcu q.a.m.  10.Ensure supplements t.i.d.  11.Ultram 50 mg p.o. q.6 h., p.r.n. pain.  12.Tylenol 650 mg p.o. q.4-6 h., p.r.n. pain.   DIET:  D3 thin liquids with full supervision.  No straws.  Meds crushed  or may administer small pills whole with puree.  Carb-modified medium.   SPECIAL INSTRUCTIONS:  Twenty-four hour supervision, assistance for  mobility and self-care.  Routine Foley care.  Routine pressure relief  measures.  Check CBGs a.c./h.s. basis.  Continue with h.s. snack.  Progressive PT, OT and speech therapy to continue past discharge.  Dry  dressing to abraded area on left scalp.   FOLLOW UP:  The patient to follow up with Dr. Thomasena Edis in 4-6 weeks.  Follow up with Dr. Gerlene Fee for a postop check in the next  2-3 weeks.  Follow up with Dr. Kriste Basque or LMD at nursing home for medical issues.      Greg Cutter, P.A.    ______________________________  Ellwood Dense, M.D.    PP/MEDQ  D:  04/19/2008  T:  04/19/2008  Job:  [id number]   cc:   Lonzo Cloud. Kriste Basque, MD  Reinaldo Meeker, M.D.

## 2010-09-11 NOTE — Procedures (Signed)
EEG NUMBER:  [id number].   HISTORY:  This is a 77 year old patient with a subdural following a  fall.  The patient has had some jaw tremoring and facial distortion.  This is a portable EEG recording.  No skull defects noted.   MEDICATIONS:  NovoLog, Keppra, Lopressor, Protonix, Apresoline, and  morphine.   EEG CLASSIFICATION:  Dysrhythmia grade 3 left hemisphere.   DISCUSSION OF RECORDING:  The background rhythm in this recording  consists of a somewhat poorly modulated 8 Hz background activity that  seems to be reactive to eye opening and closure.  As the record  progresses, the most notable feature of the recording is a persistent  slowing seen over the left hemisphere with a 6 Hz activity.  At times,  sharp complexes build throughout the left hemisphere and dissipate, but  the background slowing is always present.  No clinical correlate is  noted by the EEG technician.  Photic stimulation and hyperventilation  were not performed.  At no time during the recording, there appear to be  overt spike and spike wave discharges.  EKG monitor shows an irregular  rhythm with frequent premature ventricular contractions with a heart  rate of 60.   IMPRESSION:  This is an abnormal EEG recording due to persistent the  left brain slowing.  This study suggests a neuronal dysfunction in the  left brain.  There appears to be episodes of sharp transients or sharp  activity that come and go in the left hemisphere and may represent focal  electrographic seizures.  No obvious clinical correlate was noted by the  EEG technician.      Marlan Palau, M.D.  Electronically Signed     WGN:FAOZ  D:  03/09/2008 15:58:45  T:  03/10/2008 01:41:37  Job #:  [id number]

## 2010-09-11 NOTE — Op Note (Signed)
NAMEMarland Kitchen  DEMARQUEZ, CIOLEK NO.:  [id number]   MEDICAL RECORD NO.:  [id number]          PATIENT TYPE:  INP   LOCATION:  1237                         FACILITY:  [facility]   PHYSICIAN:  Reinaldo Meeker, M.D. DATE OF BIRTH:  05/24/1926   DATE OF PROCEDURE:  03/05/2008  DATE OF DISCHARGE:  03/10/2008                               OPERATIVE REPORT   PREOPERATIVE DIAGNOSIS:  Left acute subdural hematoma.   POSTOPERATIVE DIAGNOSIS:  Left acute subdural hematoma.   PROCEDURE:  Left frontotemporal parietal craniotomy for evacuation of  subdural hematoma.   SURGEON:  Reinaldo Meeker, MD   PROCEDURE IN DETAIL:  After placed in supine position with the head  turned towards the right, the patient's scalp was prepped and draped in  the usual sterile fashion.  A question mark-shaped incision was made in  the left scalp starting anterior the ear, heading superiorly, then  posterior on the inferior temporal lobe, then superiorly towards the  midline, and then anteriorly towards the hairline.  The flap with the  temporal muscle and fascia was reflected anteriorly and retracted with  towel clips and rubber bands.  A 3-hole craniotomy was then passed  without difficulty and the bone flap was removed without difficulty.  The dura was then opened in a cruciate fashion that was quite fragile  and had partially opened with the craniotomy.  Large amounts of blood  clot were found over the hemisphere.  This was slowly evacuated starting  from the middle and then heading outwards until a great deal of the  hematoma had been removed.  We looked in the anterior temporal anterior  frontal lobe as well as towards the midline and posteriorly and we  dislodged as much blood clot as we could safely do.  We wanted to be  careful not to start any bleeding in place that we could not somewhat  visualize.  At this time, large amounts irrigation were carried out and  the return was clear.  This was an  evidence of an bleeding source.  The  hematoma was a few days old and this was not surprising.  Irrigation was  carried out once more.  Dural stitches were then placed and then a large  piece of Duragen was placed over the dural defect.  Gelfoam was placed  around the edges of the craniotomy.  Three Rapid flap clamps were then  used to re-secure the bone flap.  These were tightened down to the  standard fashion.  A subgaleal drain was then left in the subgaleal  space and brought it through a separate stab wound incision.  The wound  was then closed with interrupted Vicryl on the temporal galea and  staples were placed on the skin.  A sterile dressing was then applied  and the patient was taken directly to the Neuro ICU in stable condition.           ______________________________  Reinaldo Meeker, M.D.     ROK/MEDQ  D:  03/11/2008  T:  03/11/2008  Job:  [id number]

## 2010-09-11 NOTE — H&P (Signed)
NAMEMarland Kitchen  GREGOREY, NABOR NO.:  [id number]   MEDICAL RECORD NO.:  [id number]          PATIENT TYPE:  IPS   LOCATION:  4031                         FACILITY:  [facility]   PHYSICIAN:  Ranelle Oyster, M.D.DATE OF BIRTH:  [birth year]   DATE OF ADMISSION:  04/01/2008  DATE OF DISCHARGE:                              HISTORY & PHYSICAL   CHIEF COMPLAINT:  Confusion and weakness.   HISTORY OF PRESENT ILLNESS:  This is a 77 year old white male with CAD  and obstructive sleep apnea who [place] on March 05, 2008.  CT revealed large subdural hemorrhage with subarachnoid  hemorrhage and left-to-right shift.  The patient had worsening mental  status and required intubation.  Surgery was delayed since the patient  was on Plavix prior to arrival.  On March 11, 2008, the patient  underwent a left frontotemporal parietal craniotomy by Dr. Gerlene Fee.  The  patient with continued obtundation secondary to sepsis from Pseudomonas  bacteremia and UTI.  He also had a Proteus pneumonia.  He was treated  with IV Primaxin for these.  The patient did develop diarrhea with C.  diff workup negative.  The patient with seizure activity on March 09, 2008, treated with Keppra.  EEG showed left brain slowing and episode of  sharp activity in the left hemisphere with questionable focal seizure.  EEG is repeated again on March 17, 2008 secondary to decreased mental  status and no seizure activity was seen.  The patient was extubated on  March 13, 2008.  Modified barium swallow on March 31, 2008 showed  severe oropharyngeal dysphagia with aspiration suspected of nectar  liquids.  He was placed on a D1 honey liquid diet.  Foley was  discontinued, but the patient had difficulty voiding and had  incontinence, so this was replaced.  PVRs were in 800 mL range.  The  patient continues to struggle with mobility and self-care and has  problems with word finding and arousal.  Rehab  saw the patient on  March 28, 2008 and felt ultimately he could benefit from an inpatient  rehab stay.  The patient is being admitted today.   REVIEW OF SYSTEMS:  Notable for weakness, seizures, wound issues,  retention, bowel and bladder incontinence, otherwise.  Full review is in  the written H&P and other pertinent positives are above.   PAST MEDICAL HISTORY:  Positive for,  1. Type 2 diabetes.  2. CAD with CABG in 1997.  3. Obstructive sleep apnea on CPAP.  4. CHF.  5. Hypertension.  6. Dyslipidemia.  7. Allergic rhinitis.  8. Obesity.  9. Colon polyps.  10.Bilateral total knee replacements.  11.Cholecystectomy.  12.Diverticulosis with bloating.  13.Hemorrhoid surgery.  14.Nasal septoplasty.  15.Arrhythmia.   FAMILY HISTORY:  Noncontributory.   SOCIAL HISTORY:  The patient is married, lives alone in a one-level  house with no steps to enter.  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  He did not drink or smoke.  He has a daughter in [place].  Wife can provide supervision post  discharge.  The patient was functional  and driving prior to arrival.   ALLERGIES:  ZOSYN and EPINEPHRINE.   HOME MEDICATIONS:  Plavix, Actos, glyburide, metformin, digoxin,  Vytorin, fexofenadine, metoprolol, and Niaspan ER.   LABORATORY DATA:  As of March 31, 2008, hemoglobin 12, white count  6.4, and platelets 167.  Sodium 133, potassium 4.0, BUN 28, and  creatinine 0.57.   PHYSICAL EXAMINATION:  VITAL SIGNS:  Blood pressure is 134/86, pulse is  62, respiratory rate 18, and temperature 97.5  GENERAL:  The patient is lying in bed and still with some lethargy but  arouses easily and can follow commands.  HEENT:  Pupils equal, round and reactive to light.  Ear, nose, and  throat exam showed fair dentition with moist to dry oral mucosa.  NECK:  Supple without JVD or lymphadenopathy.  CHEST:  Clear to auscultation bilaterally without wheezes, rales, or  rhonchi.  HEART:  Regular  rate and rhythm without murmur, rubs, or gallops.  ABDOMEN:  Nontender with active bowel sounds.  The patient does have a  rectal tube in place.  SKIN:  Notable for crani incision which was clean and intact.  The  patient has some onychomycosis of his nails in the upper and lower  extremities.  NEUROLOGIC:  Cranial nerves II-XII showed no gross abnormalities,  although I found his cough and vocal quality was weak and speech is  dysarthric.  Reflexes are decreased.  Sensation is generally intact,  easily withdrawals to pain in all four.  Judgment, orientation, memory,  and mood were all profoundly affected.  The patient does move all four  left slightly more than right but is limited due to his fluctuating  arousal level.   POST ADMISSION PHYSICIAN ASSESSMENT:  1. Functional deficits secondary to large left subdural hemorrhage      with associated subarachnoid hemorrhage and small right frontal      subdural hemorrhage.  Course complicated by sepsis/urinary tract      infection and pneumonia.  2. The patient is admitted to receive collaborative interdisciplinary      care between the physiatrist, rehab nursing staff, and therapy      team.  3. The patient's level of medical complexity and substantial therapy      needs in context of that medical necessity cannot be provided at a      lesser intensity of care.  4. The patient had been functioning at a fairly high level prior to      arrival.  In fact, he was independent.  A rehab consultation on      March 28, 2008, the patient was total assist for basic mobility,      transfers, and ambulation.  As of evaluation today by therapy, the      patient was requiring min to max assist depending on tasks for      feeding, hygiene, and ADLs and max assist for transfers.  Due to      the fact that the patient was highly functional prior to arrival      and has already shown some improvement with therapy, he has the      potential to make  measurable gains here on the inpatient rehab unit      in this setting.  These gains should be useful to him on an      everyday basis once he is discharged in assuring safe mobility and      self-care and will allow  him to return home with his available      social supports.  5. 24-hour rehab nursing will assist in management of the patient's      bowel and bladder needs/continence.  We will continue Foley      catheter for now, but we will discontinue the rectal tube.  Nursing      also will help with nutritional needs, safety awareness, observe      for signs and symptoms of neurological change, provide education      for family and patient, etc.  6. Physical therapy will assess and treat for mobility and transfers.      The patient will have significant weakness and balance issues as      well as problems with safety and judgment.  Physical therapy will      train family as needed.  Goals are supervision to min assist.  7. Occupational therapy will assess and treat for cognitive/perceptual      training, performance in ADLs, appropriate equipment and family      education as well in this brain injury setting.  Goals are      supervision to min assist.  Speech language pathology will assess      and treat for swallowing, speech quality and cognition with      supervision goals.  8. Case management/social worker will assess and treat for      psychosocial issues and discharge planning.  9. Team conferences will be held weekly to establish goals, assess      progress and to determine barriers at discharge.  10.The patient will receive at least 3 hours of therapy per day at      least 5 days per week.  11.Estimated length of stay is 3 weeks.  12.Prognosis fair to good.   MEDICAL PROBLEM LIST:  1. Type 2 diabetes:  Continue Lantus insulin for now.  Monitor blood      sugars a.c. and at bedtime with transition to oral meds depending      on intake and course.  Observe for hyper and  hypoglycemia on a      regular basis.  2. CAD with history of arrhythmia:  Continue off digoxin and      metoprolol for now.  Observe for signs and symptoms of tachycardia      and decompensation with activity.  May need to resume these      depending on course.  The patient is off Plavix currently secondary      to his bleed.  3. Seizure onset:  Continue Keppra for prophylaxis and observe for      mental status effects with this as well.  4. DVT prophylaxis with SCDs and compression stockings.  5. Dysphagia:  Continue D1 honey liquid diet.  Add IV fluids at night      to prevent dehydration.  Watch for signs and symptoms of volume      overload.  6. Pseudomonas bacteremia and UTI/Proteus pneumonia:  This has been      treated with IV Primaxin.  Observe for any signs and symptoms of      infections/aspiration.  7. DJD:  Tylenol prior to therapies for pain control.  8. Obstructive sleep apnea:  CPAP at bedtime to improve sleep cycle.      Check sleep chart while on rehab.  Sleep is important for daytime      activity tolerance.      Ranelle Oyster, M.D.  Electronically  Signed     ZTS/MEDQ  D:  04/01/2008  T:  04/02/2008  Job:  [id number]

## 2010-09-11 NOTE — Discharge Summary (Signed)
NAMEMarland Jacobson  NAVEN, GIAMBALVO NO.:  [id number]   MEDICAL RECORD NO.:  [id number]           PATIENT TYPE:   LOCATION:                                 FACILITY:   PHYSICIAN:  Donalee Citrin, M.D.        DATE OF BIRTH:  [birth year]   DATE OF ADMISSION:  03/05/2008  DATE OF DISCHARGE:  04/01/2008                               DISCHARGE SUMMARY   ADMITTING DIAGNOSIS:  Acute subdural hematoma.   DISCHARGE DIAGNOSIS:  Acute subdural hematoma.   HISTORY OF PRESENT ILLNESS:  The patient is a very pleasant 81-year  gentleman who was admitted with altered mental status with nausea and  vomiting.  The patient had been on Plavix and aspirin for coronary  artery disease who was brought to the emergency room and noted to have  acute subdural.  The patient's neurological exam on admission was  remarkable for being unresponsive.  The patient had very poor  respiratory effort.  He was intubated, moving very minimally to noxious  stimulation with very minimal cough and gag and the patient was put in  the ICU and was observed.  Over the next several days, the patient was  seen by Critical Care Medicine and stabilized with his hypertension, his  diabetes.  His neurologic status actually improved somewhat and the  patient was taken to the operating room to undergo the craniotomy for a  left subdural on March 09, 2008, postoperatively.  The patient back  to the ICU and did very well initially.  Neurologic exam remained  stable; however, postop CT showed good resolution of some of the mass  effect from the subdural.  The patient was progressed and mobilized.  Critical Care continued to follow the patient with management of his  coronary artery disease, hypertension, and ventilator management.  The  patient subsequently was able enough to be extubated.  His neurologic  exam improved where he opened his eyes spontaneously and moved somewhat  purposefully.  He did have some difficulty with  hypernatremia, this  continued to be managed with increased hydration.  He did develop an  infection that significantly blunted his mental status and cultures  obtained precipitated the patient to be placed on vancomycin and Zosyn.  This was felt to be due to bilateral pneumonia and treated as a  nosocomial pneumonia for several days.  I do not believe the cultures  ever grew out anything until ultimately I guess Pseudomonas did grow out  of some of his blood cultures and bronchial lavages.  As the infection  became under better control over the next several days, his neurologic  status also improved.  Physical and occupational therapy continued to  work with the patient.  He made good progress.  Bedside swallowing  evaluation was done by speech pathology on March 25, 2008.  It did  show some element of pharyngeal dysphagia, so he was maintained on tube  at that time; however, in the week prior to transfer to the rehab  center, the patient was able to progress and improve in swallowing and  able to tolerate a dysphagia diet.  At the time of discharge, the  patient was awake and alert.  He was able to follow some simple  commands.  He moved his left side greater than his right.  He was  accepted for rehab.  He should be maintained for the full 14-day course  of Levaquin.  Respiratory treatments intermittently for wheezing should  also be maintained, p.r.n. medication to manage pain and nausea.  He  should be maintained on his antihypertensives, which include metoprolol  and Catapres doses of which are 25 b.i.d. of metoprolol and 0.1 q.12 h.  of Catapres.  In addition, the patient is also on Keppra 500 b.i.d. and  NovoLog insulin sliding scale.  The patient will follow up with Dr.  Gerlene Fee in approximately a week or two.          ______________________________  Donalee Citrin, M.D.    GC/MEDQ  D:  04/01/2008  T:  04/01/2008  Job:  [id number]

## 2010-09-11 NOTE — Assessment & Plan Note (Signed)
[facility]                         GASTROENTEROLOGY OFFICE NOTE   NAME:Taylor Jacobson, Taylor Jacobson                    MRN:          [id number]  DATE:10/02/2006                            DOB:          1926/09/27    REASON FOR VISIT:  Taylor Jacobson is a 77 year old white male retiree from  the [employer] and from the [employer] who comes in for possible  colonoscopy scheduling.   I have seen Taylor Jacobson for many years and he has had several  colonoscopies with negative exam's except for diverticulosis.  His last  exam was approximately 10 years ago.  He currently has some gas and  bloating and occasionally loose stools but denies rectal bleeding or any  upper GI complaints or hepatobiliary problems.  He is status post  cholecystectomy.  He has multiple co-morbid medical problems including  coronary artery disease with previous bypass surgery, congestive heart  failure, hypertension, chronic anticoagulation, sleep apnea,  hypercholesterolemia and adult onset diabetes.   He follows a regular diet and denies any specific food intolerances.  In  all, his bowel habits are better when he takes fiber supplements but he  does not do this normally.  Besides his gallbladder surgery, he has had  several inguinal hernia repairs, hemorrhoid surgery in the past by Dr.  Kendrick Ranch.   He recently was evaluated by Dr. Maple Hudson on Sep 16, 2006 and was felt to  have some vasomotor rhinitis.  He was placed on topical nasal sprays.  There were no other changes made in his medical regime.   PAST MEDICAL HISTORY:  As mentioned above, he has chronic congestive  heart failure, coronary artery disease, hypertension, previous bypass  surgery along with hypercholesterolemia and is followed by Dr. Allyson Sabal of  [facility].  He has also had surgery on his knees and as  mentioned above, has rather marked sleep apnea.  He has had endoscopy in  the past which is unremarkable.   MEDICATIONS:  1. Metoprolol 25 mg twice a day.  2. Allegra 160 mg daily.  3. Enalapril 2.5 mg a day.  4. Lanoxin 0.25 mg daily.  5. Aspirin 325 mg daily.  6. Vytorin 10/40 daily.  7. Niaspan ER 1,000 mg a day.  8. Plavix 75 mg daily.  9. Enalapril 5 mg daily.  10.Actos 30 mg daily.  11.Glyburide/metformin 5/500 mg daily.   ALLERGIES:  He denies drug allergies.   FAMILY HISTORY:  Remarkable for diabetes and atherogenesis.   SOCIAL HISTORY:  He is married, lives with his wife, he has a high  school education.  He does not smoke or use ethanol.   REVIEW OF SYSTEMS:  Remarkable for chronic right knee pain, easy  bruisability, excessive thirst, lower extremity edema, nonspecific  pruritus, chronic sore throat and hoarseness and progressive deafness.  He definitely has shortness of breath and dyspnea with exertion but  denies orthopnea.  He does not have chest pain since he does not exert  himself to any degree.  His review of systems is otherwise  noncontributory.   LABORATORY DATA:  Laboratory review from August 05, 2006 shows a normal  renal profile with a hemoglobin A1C of 7.0 and a normal CBC and liver  profile.  TSH level also was normal.   PHYSICAL EXAMINATION:  GENERAL APPEARANCE:  He is a healthy-appearing  white male appearing older than his stated age.  He is 5 feet 9 inches  and weighs 234 pounds.  VITAL SIGNS:  Blood pressure 128/64 and pulse was 60 and regular.  HEENT:  I could not appreciate stigmata of chronic liver disease.  NECK:  No thyromegaly.  CHEST:  His chest was generally clear.  HEART:  He appeared to be in a regular rhythm without significant  murmurs, gallops or rubs at this time.  ABDOMEN:  Shows a large, well-healed right upper quadrant  cholecystectomy scar.  Otherwise, there is no hepatosplenomegaly,  abdominal masses or tenderness.  EXTREMITIES:  There is trace peripheral edema but no phlebitis or  swollen joints.  NEURO/PSYCH:  Mental status was  clear.  RECTAL:  Inspection of the rectum was unremarkable as was the rectal  exam without masses or tenderness.  Stool was present and Guaiac  negative.   ASSESSMENT:  1. Diverticulosis coli with abdominal gas, bloating and mild bowel      irregularity of a chronic nature.  2. Status post previous hemorrhoidal surgery for rectal bleeding.  3. Multiple medical cardiovascular and pulmonary problems including      previous bypass surgery, hypertension, chronic congestive heart      failure.  4. Chronic sleep apnea with CPAP machine use.  5. Adult onset diabetes.  6. Multiple orthopedic problems and surgeries.  7. Status post cholecystectomy.   RECOMMENDATIONS:  1. High fiber diet with daily Benefiber and liberal p.o. fluids.  2. Outpatient Hemoccult cards.  3. I really see no need for screening colonoscopy at this time since      he has a negative family history, no real evidence of previous      colon polyps and previous negative colonoscopies.  I think his      morbidity and risk of colonoscopy exceeds benefit from this      procedure at this time,      especially since he is chronically anticoagulated and has multiple      cardiovascular, pulmonary and diabetic problems.     Vania Rea. Jarold Motto, MD, Caleen Essex, FAGA  Electronically Signed    DRP/MedQ  DD: 10/02/2006  DT: 10/02/2006  Job #: [id number]   cc:   Lonzo Cloud. Kriste Basque, MD

## 2010-09-11 NOTE — Assessment & Plan Note (Signed)
[facility]                             PULMONARY OFFICE NOTE   NAME:Taylor Jacobson, Taylor Jacobson                    MRN:          [id number]  DATE:09/16/2006                            DOB:          Nov 16, 1926    PROBLEM:  Allergy consultation at the kind request of Dr. Kriste Jacobson for this  77 year old man concerned about possible allergy basis for rhinorrhea  and sneezing.   HISTORY:  He has had some awareness of spring and fall rhinitis in the  past, but never bad enough to attract a lot of attention, and never skin  tested.  He denies history of asthma.  Especially in the last 2 years,  he has had a nonseasonal rhinorrhea with occasional chains of sneezing.  Usually, he blows out no more than come clear nasal mucus.  This will  happen intermittently through the day, and is specifically aggravated  when he is around food.  It tends not to bother him in the night when he  is wearing CPAP for sleep apnea.  He has had sleep apnea treated with  CPAP for the last few years, but he had not really noticed that the CPAP  was in any way related to his current nasal complaints.  He denies  urticaria, recognized specific food sensitivities or unusual reactions  to insect stings or aspirin.  He has tried Nasonex and Astelin sprays,  which may help some.  He has tried antihistamines without recognizing  definite effect.   MEDICATIONS:  1. Metoprolol 1/2 x50 mg b.i.d.  2. Glyburide 5/500 b.i.d.  3. Lanoxin 0.25 mg.  4. Allegra 60 mg.  5. Vytorin 10/40.  6. Niaspan ER 1000 mg.  7. Aspirin 325 mg b.i.d.  8. Multivitamins.  9. Plavix 75 mg.  10.Enalapril 5 mg.  11.Actos 30 mg.   NO MEDICATION ALLERGY.   REVIEW OF SYSTEMS:  Some shortness of breath with activity and at rest.  Atypical chest pains.  Occasional palpitations.  Weight gain.  Nasal  congestion with difficulty breathing through his nose and sneezing as  noted.   PAST MEDICAL HISTORY:  1. Hypertension.  2. Myocardial infarction.  3. Congestive heart failure.  4. Cardiac arrhythmia.  5. Diabetes.  6. Elevated cholesterol.  7. Allergic rhinitis.  8. Obstructive sleep apnea on CPAP at 5 CWP with humidifier.  9. Colon polypectomy.  10.Cholecystectomy.  11.Coronary bypass graft.  12.Remote sinusitis.  13.Remote history of a nasal septoplasty.   SOCIAL HISTORY:  Nonsmoker.  Retired, living with wife.   FAMILY HISTORY:  Positive for allergies and heart disease.   OBJECTIVE:  Weight 231 pounds.  BP 138/64.  Pulse regular 60.  Room air  saturation 97%.  He is obese and alert.  There is a CPAP mask pressure mark across the bridge of his nose.  Nasal  mucosa is pale, not edematous.  There is no mucus bridging.  No visible  polyps.  Pharynx is clear.  Conjunctivae are not injected.  Voice  quality is normal.  LUNGS:  Clear.  HEART:  Sounds regular.  I do not hear a murmur or  gallop.   IMPRESSION:  The recent onset and old age, lack of specific  characteristic triggers, and nonspecific association with food suggests  the possibility this is a vasomotor rhinitis.   PLAN:  Try ipratropium nasal spray 0.06% 1 or 2 sprays each nostril  t.i.d. p.r.n. as discussed.  If this takes care of it, he can get a  longterm prescription from Dr. Kriste Jacobson.  If it proves insufficient, I  would be happy to see him again.  I appreciate the chance to meet Taylor Jacobson.     Taylor D. Maple Hudson, MD, Taylor Jacobson, FACP  Electronically Signed    CDY/MedQ  DD: 09/16/2006  DT: 09/16/2006  Job #: [id number]   cc:   Taylor Jacobson. Taylor Basque, MD

## 2010-09-14 NOTE — Op Note (Signed)
[facility]  Patient:    Taylor Jacobson, Taylor Jacobson                         MRN: [id number] Proc. Date: 08/25/00 Adm. Date:  [id number] Attending:  Twana First                           Operative Report  PREOPERATIVE DIAGNOSIS:  Right knee degenerative joint disease.  POSTOPERATIVE DIAGNOSIS:  Right knee degenerative joint disease.  PROCEDURES PERFORMED: 1. Right total knee replacement using Osteonics Scorpio total knee system    with #9 cemented femoral component, #11 cemented tibial component with 12  mm polyethylene tibial spacer and 28 mm polyethylene cemented patella. 2. Right knee lateral retinacular release.  SURGEON:  Elana Alm. Thurston Hole, M.D.  ASSISTANT:  Julien Girt, P.A.  ANESTHESIA:  General endotracheal.  OPERATIVE TIME:  1 hour and 40 minutes.  COMPLICATIONS:  None.  DESCRIPTION OF PROCEDURE:  Mr. Salsgiver was brought to the operating room on August 25, 2000 and placed on the operating room table in a supine position. After an adequate level of general anesthesia was obtained, he had a Foley catheter placed under sterile conditions and received Ancef 1 g IV preoperatively for prophylaxis. Initially, the right knee was examined under anesthesia and range of motion from -5 to 125 degrees with mild varus deformity; the knee with stable ligamentous exam. After this was done, the right leg was prepped using sterile Betadine and draped using sterile technique. The leg was exsanguinated and a thigh tourniquet elevated to 350 mmHg.  Initially through a 20 cm longitudinal incision, initial exposure was made. The underlying subcutaneous tissues were incised in line with the skin incision. A median arthrotomy was performed revealing an excessive amount of normal appearing joint fluid. The articular surfaces were inspected. He had grade IV changes medially, grade II and III changes laterally, grade IV changes in the patellofemoral joint.  Osteophytes were removed from the femoral condyles and tibial plateau. The medial and lateral meniscal remnants were removed as well as the anterior cruciate ligament. An intramedullary drill was drilled up the femoral canal for placement of the distal femoral cutting jig which was placed in the appropriate amount of rotation and a distal 12 mm cut was made. The distal femur was sized. A #9 was felt to be the appropriate size and a #9 cutting jig was placed in the appropriate amount of rotation and these cuts were made. The proximal tibia was exposed. The tibial spines were removed with an oscillating saw. The intramedullary drill drilled down the tibial canal for placement of the proximal tibial cutting jig which was then set in the appropriate amount of rotation and a proximal 8 mm cut was made. After this was done, then the Scorpio PCL cutter was placed back on the femur and these cuts were made. After this was done, the #9 femoral trial was placed on the femur with an excellent fit. The #11 base plate with the 12 mm polyethylene spacer was placed on the tibial surface and the knee taken through a range of motion 0 to 125 degrees with excellent stability and no lift off on the tray. The tibial tray was then marked for rotation and the keel cut was made. The patella was incised. A 28 mm was found to be the appropriate size and a recessed 10 mm x 28 mm cut was made and  three locking holes placed. After this was done, it was felt that all the trial components were of excellent size, fit and stability. They were removed. The knee was then jet lavaged irrigated with 3 L of saline solution and then the proximal tibia was exposed. The #11 tibial base plate with cement backing was then hammered into position with an excellent fit with excess cement being removed from around the edges. The #9 femoral component with cement backing was hammered into position also with an excellent fit with excess  cement being removed from around the edges. The 12 mm polyethylene spacer was then locked onto the tibial base plate. The 28 mm patellar button was locked into its recessed hole and held there with a clamp. After the cement hardened, patellofemoral tracking was evaluated. There was still some lateral retinacular tightness, and thus, a lateral retinacular release was performed. After this was done, it was felt that all the components were of excellent size, fit and stability. Then we further irrigated and the knee arthrotomy was closed with #1 Panacryl suture and #1 Ethibond suture over two medium Hemovac drains. The subcutaneous tissues were closed with #0 and 2-0 Vicryl and the skin closed with skin staples. Sterile dressings were applied. The Hemovac was injected with 0.25% Marcaine with epinephrine and clamped. Sterile dressings were applied. The tourniquet was released. The patient was then turned lateral where an epidural catheter was placed by anesthesia for postoperative pain control. He was then awakened and taken to the recovery room in stable condition.  Sponge, needle, lap and instrument counts were correct x 2 at the end of the case. DD:  08/25/00 TD:  08/25/00 Job: [id number]

## 2010-09-14 NOTE — Letter (Signed)
January 13, 2006     C. Lesia Sago, M.D.  [address]  [address]  [place], [place] [address]   RE:  Taylor Jacobson  MRN:  [id number]  /  DOB:  Feb 20, 1927   Dear Taylor Jacobson,   Taylor Jacobson is a 77 year old patient of mine I follow for general medical  purposes.  He has an old chart in your office, having seen Dr. Vickey Huger in  the past for obstructive sleep apnea on referral from Dr. Nanetta Batty,  his cardiologist.  Taylor Jacobson has arteriosclerotic heart disease with  previous coronary artery bypass grafting and I presume sleep apnea study  through his office and then treated by Dr. Vickey Huger.  He also has diabetes  controlled on oral agents and hypercholesterolemia on Vytorin.  I saw him  several months ago after a spell that occurred in which the patient stated  he took a right turn while walking and could not stop turning right!  This  lasted about 15 minutes not associated with any loss of consciousness and no  focal weakness.  His exam at that time was unremarkable and I felt I could  not rule out a TIA or similar episode and he did not appear to be  hypoglycemic, so I ordered an MRI but found that he has a small piece of  metal in his right orbit, the result of a hammer accident many years ago.  We therefore did a CT which showed some age-related atrophy and no evidence  of any intracranial hemorrhage or acute infarct, etc.  Taylor Jacobson had been  taking an aspirin a day and I added Plavix to the regimen and told him that  if he had any recurrent episodes we would refer him for further evaluation.   Taylor Jacobson returned yesterday complaining of another unusual spell.  He  described having a headache and noted that his speech was not quite right  and states that he was hearing voices.  He feels like he went blank but  did not fall down and had no apparent trauma.  Again, this episode lasted  about 15 minutes and resolved spontaneously.  At the present time, I am  concerned about  possible seizure/temporal lobe epilepsy, etc. and would like  to refer him for your evaluation and EEG.   Taylor Jacobson' problem list includes:  1. Arteriosclerotic heart disease with previous coronary artery bypass      grafting followed by Dr. Allyson Sabal.  2. Mild obstructive sleep apnea on sleep study by Dr. Allyson Sabal and treated by      Dr. Vickey Huger.  3. Diabetes mellitus - controlled on oral agents.  4. Hypercholesterolemia - controlled on Vytorin.  5. Degenerative arthritis with previous bilateral total knee replacements      and history of low back pain.  6. Colon polyps and hemorrhoids.   He has no known drug allergies.   Thank you very much for your evaluation of this nice gentleman.  I am  including copies of his CT report and recent labs for your records.    Sincerely,      Taylor Cloud. Kriste Basque, MD   SMN/MedQ  DD:  01/14/2006  DT:  01/14/2006  Job #:  [id number]

## 2010-09-14 NOTE — Cardiovascular Report (Signed)
[place]  Patient:    Taylor Jacobson, Taylor Jacobson                         MRN: [id number] Proc. Date: 08/22/00 Adm. Date:  [id number] Disc. Date: [id number] Attending:  Berry, Jonathan [place] CC:         Cardiac Catheterization Laboratory  Scott M. Kriste Basque, M.D. [facility]  [facility], [place] [address] [place], [place] [address]   Cardiac Catheterization  PROCEDURES PERFORMED:  Diagnostic coronary angiogram.  INDICATIONS:  Mr. Bocock is a 77 year old, moderately overweight, white male, with a history of ischemic heart disease, status post CABG x 4 by Dr. Ofilia Neas in 1997 with a LIMA to his LAD, vein graft to the diagonal branch, obtuse marginal branch, and distal right.  He has immediate postoperative Cardiolite performed which showed inferior scar without ischemia.  His other problems include hypertension with hyperlipidemia.  He complains of shortness of breath but denies chest pain.  He is scheduled for knee surgery on April 29 and had a Cardiolite stress test recently to risk stratify him which showed inferolateral scar with anterolateral ischemia which is new since his prior Cardiolite.  He presents now for diagnostic coronary arteriography to risk stratify him.  DESCRIPTION OF PROCEDURE:  The patient was brought to the second floor [facility] in the postabsorptive state.  He was premedicated with p.o. Valium.  His right groin was prepped and shaved in the usual sterile fashion.  Xylocaine 1% was used for local anesthesia.  A 6 French sheath was inserted into the right femoral artery using standard Seldinger technique.  A 6 French right and left Judkins catheter as well as a 6 French pigtail catheter were used for selective coronary angiography, left ventriculography, selective vein graft angiography, selective IMA angiography, and distal abdominal aortography.  Omnipaque dye was used for the entirety  of the case.  Retrograde, aortic, left ventricular, and pullback pressures were recorded.  HEMODYNAMICS: 1. Aortic systolic pressure 172, diastolic pressure 83. 2. Left ventricular systolic pressure 174, diastolic pressure 16.  SELECTIVE CORONARY ANGIOGRAPHY: 1. Left main:  Normal. 2. Left anterior descending:  The LAD had a 95% stenosis in its midportion    with competitive flow at the origin of the first diagonal branch which was    not visualized and of the distal LAD. 3. Ramus intermedius branch:  This is a very small vessel with a 99%    segmental proximal stenosis. 4. Left circumflex:  This gave off a high distally bifurcating first    marginal in the ramus distribution which is 100% occluded proximally. 5. Right coronary artery:  This is a large dominant vessel with long    segmental 90+% stenosis involving the genu of the vessel.  There was    retrograde filling of the RCA vein graft. 6. RCA vein graft:  Widely patent. 7. Circumflex marginal vein graft:  Widely patent. 8. Diagonal vein graft:  Widely patent. 9. LIMA to the LAD:  Widely patent.  LEFT VENTRICULOGRAPHY:  RAO left ventriculogram was performed using 25 cc of Omnipaque dye at 12 cc/sec.  The overall LVEF is estimated approximately 35% with moderate global and severe inferoapical hypokinesis.  DISTAL ABDOMINAL AORTOGRAPHY:  Distal abdominal aortogram was performed using 20 cc of Omnipaque dye 20 cc/sec.  The renal arteries are widely patent.  The infrarenal abdominal aorta and iliac bifurcation appear free of significant atherosclerotic  changes.  IMPRESSION:  Taylor Jacobson has patent grafts with moderate left ventricular dysfunction.  I am unsure the etiology was anterolateral ischemia, though all territories are well perfused and this represents a low risk angiogram.  The sheaths were removed and pressure was held on the groin to achieve hemostasis.  The patient left the lab in stable condition.  He will  be hydrated and discharged home later today.  He is cleared for his upcoming surgery on Monday.  He left the lab in stable condition. DD:  08/22/00 TD:  08/24/00 Job: [id number]

## 2010-09-14 NOTE — Procedures (Signed)
[place]  Patient:    Taylor Jacobson, Taylor Jacobson                         MRN: [id number] Proc. Date: 08/25/00 Adm. Date:  [id number] Attending:  Twana First                           Procedure Report  PROCEDURES PERFORMED:  Epidural catheter placement.  INDICATIONS:  Taylor Jacobson is a 77 year old man who presents to the operating room for total knee replacement. The patient gave informed consent to Dr. Johnathan Hausen of Anesthesia for a postoperative epidural catheter. Due to Dr. Burman Foster unavailability, I was asked to perform the procedure.  DESCRIPTION OF PROCEDURE:  The patient remained under general anesthesia following his surgical procedure. He was placed in the right lateral decubitus position. The thoracic spine was sterilely prepped and draped. A #70 gauge Tuohy needle was advanced via loss of resistance at the L3-L4 without success, then the L2-L3 with success. The needle did not aspirate blood or CSF and the catheter was inserted approximately 4 cm into the patients epidural space. The needle was then removed and the catheter was secured to the patients back. The catheter did not aspirate blood or CSF and a test dose of 50 mg of lidocaine was negative. Following a negative test dose, 3 cc of fentanyl were administered to the patient diluted with 3 cc of normal saline. The patient was then extubated and taken to the post anesthesia care unit where he was placed on a Marcaine and fentanyl pump. The patient tolerated the procedure well. DD:  08/25/00 TD:  08/25/00 Job: [id number]

## 2010-09-14 NOTE — Op Note (Signed)
NAME:  Taylor Jacobson, Taylor Jacobson                            ACCOUNT NO.:  [id number]   MEDICAL RECORD NO.:  [id number]                   PATIENT TYPE:  INP   LOCATION:  NA                                   FACILITY:  [facility]   PHYSICIAN:  Robert A. Thurston Hole, M.D.              DATE OF BIRTH:  [birth year]   DATE OF PROCEDURE:  08/29/2003  DATE OF DISCHARGE:                                 OPERATIVE REPORT   PREOPERATIVE DIAGNOSIS:  Left knee degenerative joint disease.   POSTOPERATIVE DIAGNOSIS:  Left knee degenerative joint disease.   OPERATION PERFORMED:  Left total knee replacement using Osteonics Scorpio  total knee replacement using #9 cemented femur, #9 cemented tibia with 15 mm  polyethylene flexed tibial spacer and 26 mm polyethylene cemented patella.   SURGEON:  Elana Alm. Thurston Hole, M.D.   ASSISTANT:  Julien Girt, P.A.   ANESTHESIA:  General.   OPERATIVE TIME:  One hour and 20 minutes.   COMPLICATIONS:  None.   DESCRIPTION OF PROCEDURE:  Taylor Jacobson was brought to the operating room on  Aug 29, 2003, placed on the operating table in supine position.  After an  adequate level of general anesthesia was obtained, his left knee was  examined.  Range of motion -5 to 125 degrees, moderate varus deformity.  Knee stable to ligamentous exam, with normal patellar tracking.  He has a  Foley catheter placed under sterile conditions and received Ancef 1 gm IV  and Cipro 400 mg IV for prophylaxis.  He has had a preoperative urine  culture positive for Proteus and thus I elected to use  Cipro as well as  Ancef.  The left leg was prepped using sterile DuraPrep and draped using  sterile technique.  The leg was exsanguinated and a thigh tourniquet  elevated 370 mmHg.  Initially through a 15 cm longitudinal incision based  over the patella, initial exposure was made.  The underlying subcutaneous  tissues were incised in line with the skin incision.  A median arthrotomy  was performed revealing an  excessive amount of normal-appearing joint fluid.  The articular surfaces were inspected.  He had grade 4 changes medially,  grade 3 changes laterally and grade 3 and 4 changes in the patellofemoral  joint.  Osteophytes were removed from the femoral condyles and tibial  plateau.  The medial and lateral meniscal remnants were removed as well as  the anterior cruciate ligament.  Intramedullary drill was then drilled up  the femoral canal for placement of the distal femoral cutting jig which was  placed in the appropriate amount of rotation and the distal 12 mm cut was  made.  Distal femur was then sized.  A #9 was found to be appropriate size,  a #9 cutting jig was placed and then these cuts were made.  The proximal  tibia was then exposed.  The tibial spines were removed with an  oscillating  saw.  The intramedullary drill drilled down the tibial canal with placement  of the proximal tibial cutting jig in the appropriate amount of rotation and  a proximal 10 mm cut was made.  After this was done, the Scorpio PCL cutter  was placed back on the distal femur and these cuts were made.  After this  was done, the #9 femoral trial was placed.  The #9 tibial base plate trial  was placed and with a 15 mm polyethylene Flex tibial spacer there was found  to be excellent restoration of normal alignment, excellent stability, range  of motion 0 to 120 degrees.  The tibial base plate was then marked in  rotation and the keel cut was made.  After this was done, the patella was  sized.  A 26 mm was found to be the appropriate size and the recessed 10 mm  x 26 mm cut was made and three locking holes were made.  After this was done  it was felt that all the trial components were of excellent size, fit and  stability.  They were then removed.  The knee was then jet lavaged,  irrigated with 3L of saline solution and then the proximal tibia was  exposed.  Then a #9 tibial base plate with cement backing was then  hammered  in position with an excellent fit with excess cement being removed from  around the edges.  The #9 femoral component with cement backing was hammered  into position also with an excellent fit with excess cement being removed  from around the edges.  The 15 mm polyethylene Flex tibial spacer was then  locked onto the tibial base plate.  Knee taken through a range of motion 0  to 120 degrees with excellent stability, excellent correction of his varus  deformity.  The 26 mm polyethylene recessed patella with cement backing was  then locked into its recessed hole as well.  After the cement hardened,  patellofemoral tracking was evaluated.  This was found to be normal. At this  point it was felt that all of the components were of excellent size, fit and  stability.  The knee was further irrigated with saline and then the  arthrotomy was closed with #1 Ethibond suture over two medium Hemovac  drains.  Subcutaneous tissue was closed with 0 and 2-0 Vicryl.  Skin closed  with skin staples, sterile dressings were applied.  Hemovac injected with  0.25% Marcaine.  Sterile dressings and long leg splint applied.  Tourniquet  was released.  Patient then awakened, extubated and taken to recovery room  in stable condition.  Sponge and needle counts were correct times two at the  end of this case.                                               Robert A. Thurston Hole, M.D.    RAW/MEDQ  D:  08/29/2003  T:  08/29/2003  Job:  [id number]

## 2010-09-14 NOTE — Discharge Summary (Signed)
[place]  Patient:    Taylor Jacobson, Taylor Jacobson                         MRN: [id number] Adm. Date:  [id number] Disc. Date: [id number] Attending:  Twana First Dictator:   Julien Girt, P.A.                           Discharge Summary  ADMISSION DIAGNOSES: 1. End-stage degenerative joint disease, right knee. 2. Coronary artery disease. 3. Hypertension. 4. Diet-controlled diabetes.  DISCHARGE DIAGNOSES: 1. End-stage degenerative joint disease, right knee. 2. Coronary artery disease. 3. Hypertension. 4. Diet-controlled diabetes. 5. Chronic obstructive pulmonary disease. 6. Obesity.  HISTORY OF PRESENT ILLNESS:  The patient is a 77 year old male with a history of bilateral knee DJD, right greater than left.  He has tried anti-inflammatories, cortisone shots, and physical therapy, all without success.  At that point in time, he has pain at night, pain at rest, pain with activity unrelieved by any medication.  He understands the risks, benefits, and possible complications of a right total knee replacement.  PROCEDURE IN HOUSE:  On August 25, 2000, the patient underwent a right total knee replacement.  He tolerated the procedure well by Molly Maduro A. Thurston Hole, M.D.  HOSPITAL COURSE:  The patient was admitted postoperatively for pain control, DVT prophylaxis, and physical therapy.  Immediately postoperatively we ordered a cardiology consult due to his significant cardiac history and some PACs during surgery.  On postoperative day #1, the patient was alert and oriented. The hemoglobin was 11.9 and the white cell count was 8.0.  The INR was 1.2. Sodium 137, potassium 4.6, chloride 97, CO2 28, BUN 21, creatinine 1.2, blood glucose 210.  His blood pressure was 95/55.  The knee wound dressing was saturated.  The epidural was discontinued on postoperative day #1.  He began physical therapy.  The dressing was changed.  The Foley was discontinued and his IV  fluids were changed to normal saline with 20 mEq of potassium.  On postoperative day #2, the patient had some Niferex, but overall was doing well.  The hemoglobin was 10.8 and the white cell count was 7.7.  The INR was 1.4.  Sodium 131, potassium 4.0, chloride 96, CO2 27, BUN 27, creatinine 1.4, and blood glucose 241.  IV fluids were noted changed.  On postoperative day #1, the order was once again written to change IV fluids to normal saline with 20 mEq of potassium.  On postoperative day #3, the patient continued to improve.  The surgical wound was well approximated and healed.  The temperature maximum was 98.4 degrees.  The INR was 2.0.  The white cell count was 8.0 and the hemoglobin was 10.9.  Sodium 132, potassium 4.5, glucose falling down to 190, BUN 19, creatinine 1.1, calcium 8.9.  Home health was arranged on postoperative day #3.  On postoperative day #4, the patient was afebrile with a temperature maximum of 98.6 degrees.  The hemoglobin was 11. The sodium was 134, potassium 4.2, BUN 20, creatinine 1.0, and blood sugar 229.  DISPOSITION:  The patient was discharged to home in stable condition and encouraged to follow up with his regular medical doctor about his consistently high CBGs.  DISCHARGE MEDICATIONS: 1. Percocet one to two q.4-6h. p.r.n. pain. 2. Coumadin 5 mg one p.o. q.d. 3. Colace 100 mg one p.o. b.i.d. 4. Multivitamins one p.o. q.d. 5. Enalapril  5 mg half of a tablet b.i.d. 6. Zocor 40 mg one p.o. q.d. 7. Metoprolol 50 mg half of a tablet b.i.d. 8. Lanoxin 0.25 mg one tablet p.o. q.d. 9. Allegra 60 mg one p.o. q.d.  ACTIVITY:  He was weightbearing as tolerated.  DIET:  On a diabetic diet.  FOLLOW-UP:  He will follow up with [place] in a week and a half and with his regular medical doctor this week for his sugars. DD:  10/08/00 TD:  10/08/00 Job: [id number]

## 2010-09-14 NOTE — Discharge Summary (Signed)
NAME:  Taylor Jacobson, Taylor Jacobson                            ACCOUNT NO.:  [id number]   MEDICAL RECORD NO.:  [id number]                   PATIENT TYPE:  INP   LOCATION:  5022                                 FACILITY:  [facility]   PHYSICIAN:  Elana Alm. Thurston Hole, M.D.              DATE OF BIRTH:  May 28, 1926   DATE OF ADMISSION:  08/29/2003  DATE OF DISCHARGE:  09/02/2003                                 DISCHARGE SUMMARY   ADMISSION DIAGNOSES:  1. End-stage degenerative joint disease, left knee.  2. Coronary artery disease.  3. Hypertension.  4. Diabetes.  5. Osteoarthritis.   DISCHARGE DIAGNOSES:  1. End-stage degenerative joint disease, left knee, status post total knee     replacement.  2. Coronary artery disease.  3. Hypertension.  4. Diabetes.  5. Osteoarthritis.  6. Ileus.  7. Atrial fibrillation.  8. Urinary tract infection with Proteus.   HISTORY OF PRESENT ILLNESS:  The patient is a 77 year old white male who had  a history of coronary artery disease and has undergone coronary artery  bypass grafting.  He has end-stage DJD of both knees.  He had a right total  knee replacement in 2002.  He has done well with this and now is ready for  his left total knee replacement.  He understands the risks, benefits, and  possible complications of surgery and is without question.   PROCEDURES AND HOSPITAL COURSE:  On Aug 29, 2003, the patient underwent a  left total knee replacement by Dr. Thurston Hole.  Postoperatively, he had a  femoral nerve block by anesthesia.  He tolerated both procedures well.   On postop day #1, the patient was doing well overall.  He had a T-max. of  101.  His blood sugar was slightly elevated at 212.  He was on a sliding  scale.  We restarted his Glucovance.  Hemoglobin was 12.9.  He was continued  on IV fluids.  At that time, he had a significant abdominal distention.  A  KUB was ordered that did show an ileus.  GI was consulted to manage this  patient's ileus.   On postop day  #2, the patient had a bowel movement, did have gas.  His knee  was well approximated.  He is getting a new KUB today to determine if his  ileus has resolved.  His diet is being monitored by GI.   On postop day #3, the patient continued to improve.  He has no complaints  today.  His UA showed Proteus in his urine that is sensitive to Keflex.  We  will discontinue the Cipro and the Ancef and place him on Keflex 500 mg 1  p.o. four times a day.  He is 2+ assist to get out of bed but walking well.  His hemoglobin is 10.6.  His INR is 10.1.   On postop day #4, the patient was doing well.  He  has had no complaints  today.  Postop ileus has resolved.  He is on Keflex for his Proteus UTI.  His surgical wound is well approximated.  He is being discharged to home in  stable condition, weightbearing as tolerated.   DISCHARGE MEDICATIONS:  1. Percocet 1 to 2 q.4-6h. p.r.n. pain.  2. Coumadin 5 mg 1 tablet with evening meal.  3. Enalapril 5 mg 1 tablet twice a day.  4. Metoprolol 50 mg 1 tablet twice a day.  5. Zocor 40 mg 1 tablet twice a day.  6. Allegra 60 mg 1 tablet a day.  7. Digitek 0.25 mg 1 tablet a day.  8. Glucovance 2.5 mg 1 tablet a day.  9. Zetia 10 mg 1 tablet a day.  10.      Niaspan 500 mg 1 tablet a day.  11.      Keflex 500 mg 1 tablet four times a day for 7 days.  12.      Colace 100 mg twice a day.  13.      Senokot S 2 before dinner.   FOLLOW UP:  He will follow up with Dr. Thurston Hole on Sep 12, 2003.      Kirstin Shepperson, P.A.                  Robert A. Thurston Hole, M.D.    KS/MEDQ  D:  10/12/2003  T:  10/12/2003  Job:  [id number]

## 2010-11-27 ENCOUNTER — Other Ambulatory Visit: Payer: Self-pay | Admitting: Surgery

## 2011-01-03 ENCOUNTER — Other Ambulatory Visit: Payer: Self-pay | Admitting: Dermatology

## 2011-01-29 LAB — BLOOD GAS, ARTERIAL
Acid-Base Excess: 0.1 mmol/L (ref 0.0–2.0)
Acid-base deficit: 0.1 mmol/L (ref 0.0–2.0)
Acid-base deficit: 0.8 mmol/L (ref 0.0–2.0)
Acid-base deficit: 1.5 mmol/L (ref 0.0–2.0)
Acid-base deficit: 2.1 mmol/L — ABNORMAL HIGH (ref 0.0–2.0)
Acid-base deficit: 0.8
Bicarbonate: 22.6 mEq/L (ref 20.0–24.0)
Drawn by: 13898
Drawn by: 308031
Drawn by: 308591
Drawn by: 235321
FIO2: 0.4 %
FIO2: 0.4 %
FIO2: 0.4 %
MECHVT: 600 mL
MECHVT: 600 mL
MECHVT: 600 mL
MECHVT: 600 mL
MECHVT: 0.5
O2 Saturation: 97.5 %
O2 Saturation: 97.8 %
O2 Saturation: 98 %
O2 Saturation: 98.6 %
PEEP: 5 cmH2O
PEEP: 5 cmH2O
PEEP: 3 cmH2O
PEEP: 5 cmH2O
PEEP: 5 cmH2O
PEEP: 5 cmH2O
PEEP: 5
Patient temperature: 98.5
Patient temperature: 98.9
Patient temperature: 100.1
Patient temperature: 98.6
Patient temperature: 98.6
Patient temperature: 98.6
RATE: 14 resp/min
RATE: 14 resp/min
RATE: 14 resp/min
RATE: 14 resp/min
RATE: 12
TCO2: 23.4 mmol/L (ref 0–100)
TCO2: 24.6 mmol/L (ref 0–100)
pCO2 arterial: 29.6 mmHg — ABNORMAL LOW (ref 35.0–45.0)
pCO2 arterial: 32 mmHg — ABNORMAL LOW (ref 35.0–45.0)
pCO2 arterial: 33.1 mmHg — ABNORMAL LOW (ref 35.0–45.0)
pCO2 arterial: 33.9 mmHg — ABNORMAL LOW (ref 35.0–45.0)
pCO2 arterial: 35
pH, Arterial: 7.393 (ref 7.350–7.450)
pH, Arterial: 7.462 — ABNORMAL HIGH (ref 7.350–7.450)
pH, Arterial: 7.467 — ABNORMAL HIGH (ref 7.350–7.450)
pH, Arterial: 7.425
pO2, Arterial: 94.4 mmHg (ref 80.0–100.0)
pO2, Arterial: 110 mmHg — ABNORMAL HIGH (ref 80.0–100.0)
pO2, Arterial: 110 mmHg — ABNORMAL HIGH (ref 80.0–100.0)
pO2, Arterial: 129 mmHg — ABNORMAL HIGH (ref 80.0–100.0)

## 2011-01-29 LAB — URINALYSIS, MICROSCOPIC ONLY
Ketones, ur: NEGATIVE mg/dL
Nitrite: POSITIVE — AB
Specific Gravity, Urine: 1.025 (ref 1.005–1.030)
Urobilinogen, UA: 4 mg/dL — ABNORMAL HIGH (ref 0.0–1.0)

## 2011-01-29 LAB — COMPREHENSIVE METABOLIC PANEL
ALT: 77 U/L — ABNORMAL HIGH (ref 0–53)
ALT: 82 U/L — ABNORMAL HIGH (ref 0–53)
ALT: 85 U/L — ABNORMAL HIGH (ref 0–53)
ALT: 20
ALT: 88 — ABNORMAL HIGH
AST: 58 U/L — ABNORMAL HIGH (ref 0–37)
AST: 69 U/L — ABNORMAL HIGH (ref 0–37)
AST: 30
AST: 45 — ABNORMAL HIGH
Albumin: 1.7 g/dL — ABNORMAL LOW (ref 3.5–5.2)
Albumin: 1.7 g/dL — ABNORMAL LOW (ref 3.5–5.2)
Albumin: 3.1 — ABNORMAL LOW
Alkaline Phosphatase: 93 U/L (ref 39–117)
Alkaline Phosphatase: 55
BUN: 35 — ABNORMAL HIGH
CO2: 24 mEq/L (ref 19–32)
CO2: 28 mEq/L (ref 19–32)
CO2: 24
Calcium: 8 mg/dL — ABNORMAL LOW (ref 8.4–10.5)
Calcium: 8.3 mg/dL — ABNORMAL LOW (ref 8.4–10.5)
Calcium: 8.5
Chloride: 107 mEq/L (ref 96–112)
Chloride: 110
Creatinine, Ser: 0.97 mg/dL (ref 0.4–1.5)
Creatinine, Ser: 0.95
GFR calc Af Amer: >60 mL/min (ref >60)
GFR calc Af Amer: >60 mL/min (ref >60)
GFR calc Af Amer: >60
GFR calc Af Amer: >60
GFR calc non Af Amer: >60 mL/min (ref >60)
GFR calc non Af Amer: >60 mL/min (ref >60)
GFR calc non Af Amer: >60
Glucose, Bld: 242 mg/dL — ABNORMAL HIGH (ref 70–99)
Glucose, Bld: 200 — ABNORMAL HIGH
Potassium: 3.8 mEq/L (ref 3.5–5.1)
Potassium: 3.4 — ABNORMAL LOW
Sodium: 142 mEq/L (ref 135–145)
Sodium: 144 mEq/L (ref 135–145)
Sodium: 146 mEq/L — ABNORMAL HIGH (ref 135–145)
Sodium: 144
Total Bilirubin: 0.9
Total Bilirubin: 1.3 — ABNORMAL HIGH
Total Protein: 5.4 g/dL — ABNORMAL LOW (ref 6.0–8.3)
Total Protein: 6.1
Total Protein: 6.5

## 2011-01-29 LAB — DIFFERENTIAL
Eosinophils Relative: 0
Lymphocytes Relative: 22
Monocytes Absolute: 0.4
Monocytes Relative: 6
Neutro Abs: 5.3

## 2011-01-29 LAB — BASIC METABOLIC PANEL
BUN: 30 mg/dL — ABNORMAL HIGH (ref 6–23)
BUN: 25 mg/dL — ABNORMAL HIGH (ref 6–23)
BUN: 41 mg/dL — ABNORMAL HIGH (ref 6–23)
BUN: 41 mg/dL — ABNORMAL HIGH (ref 6–23)
BUN: 45 mg/dL — ABNORMAL HIGH (ref 6–23)
BUN: 46 mg/dL — ABNORMAL HIGH (ref 6–23)
BUN: 48 mg/dL — ABNORMAL HIGH (ref 6–23)
BUN: 53 mg/dL — ABNORMAL HIGH (ref 6–23)
BUN: 25 — ABNORMAL HIGH
CO2: 22 mEq/L (ref 19–32)
CO2: 25 mEq/L (ref 19–32)
CO2: 27 mEq/L (ref 19–32)
CO2: 30 mEq/L (ref 19–32)
CO2: 23
Calcium: 8.6 mg/dL (ref 8.4–10.5)
Calcium: 7.9 mg/dL — ABNORMAL LOW (ref 8.4–10.5)
Calcium: 8 mg/dL — ABNORMAL LOW (ref 8.4–10.5)
Calcium: 8.1 mg/dL — ABNORMAL LOW (ref 8.4–10.5)
Calcium: 8.2 mg/dL — ABNORMAL LOW (ref 8.4–10.5)
Calcium: 8.3 mg/dL — ABNORMAL LOW (ref 8.4–10.5)
Calcium: 8.4 mg/dL (ref 8.4–10.5)
Calcium: 8.4 mg/dL (ref 8.4–10.5)
Calcium: 8.6 mg/dL (ref 8.4–10.5)
Calcium: 8.6 mg/dL (ref 8.4–10.5)
Calcium: 8.6 mg/dL (ref 8.4–10.5)
Chloride: 101 mEq/L (ref 96–112)
Chloride: 107 mEq/L (ref 96–112)
Chloride: 110 mEq/L (ref 96–112)
Chloride: 115 mEq/L — ABNORMAL HIGH (ref 96–112)
Chloride: 105
Creatinine, Ser: 1.01 mg/dL (ref 0.4–1.5)
Creatinine, Ser: 0.6 mg/dL (ref 0.4–1.5)
Creatinine, Ser: 0.66 mg/dL (ref 0.4–1.5)
Creatinine, Ser: 0.72 mg/dL (ref 0.4–1.5)
Creatinine, Ser: 0.81 mg/dL (ref 0.4–1.5)
Creatinine, Ser: 0.92 mg/dL (ref 0.4–1.5)
Creatinine, Ser: 1.06 mg/dL (ref 0.4–1.5)
Creatinine, Ser: 1.11 mg/dL (ref 0.4–1.5)
GFR calc Af Amer: >60 mL/min (ref >60)
GFR calc Af Amer: >60 mL/min (ref >60)
GFR calc Af Amer: >60 mL/min (ref >60)
GFR calc Af Amer: >60 mL/min (ref >60)
GFR calc Af Amer: >60 mL/min (ref >60)
GFR calc Af Amer: >60 mL/min (ref >60)
GFR calc non Af Amer: >60 mL/min (ref >60)
GFR calc non Af Amer: 59 mL/min — ABNORMAL LOW (ref >60)
GFR calc non Af Amer: >60 mL/min (ref >60)
GFR calc non Af Amer: >60 mL/min (ref >60)
GFR calc non Af Amer: >60 mL/min (ref >60)
GFR calc non Af Amer: >60 mL/min (ref >60)
GFR calc non Af Amer: >60 mL/min (ref >60)
GFR calc non Af Amer: >60
Glucose, Bld: 190 mg/dL — ABNORMAL HIGH (ref 70–99)
Glucose, Bld: 176 mg/dL — ABNORMAL HIGH (ref 70–99)
Glucose, Bld: 220 mg/dL — ABNORMAL HIGH (ref 70–99)
Glucose, Bld: 232 mg/dL — ABNORMAL HIGH (ref 70–99)
Glucose, Bld: 305 mg/dL — ABNORMAL HIGH (ref 70–99)
Glucose, Bld: 215 — ABNORMAL HIGH
Potassium: 3.8 mEq/L (ref 3.5–5.1)
Potassium: 3.3 mEq/L — ABNORMAL LOW (ref 3.5–5.1)
Potassium: 3.7 mEq/L (ref 3.5–5.1)
Potassium: 4.2 mEq/L (ref 3.5–5.1)
Potassium: 4.1
Sodium: 152 mEq/L — ABNORMAL HIGH (ref 135–145)
Sodium: 140 mEq/L (ref 135–145)
Sodium: 144 mEq/L (ref 135–145)
Sodium: 150 mEq/L — ABNORMAL HIGH (ref 135–145)
Sodium: 139

## 2011-01-29 LAB — CBC
HCT: 35.8 % — ABNORMAL LOW (ref 39.0–52.0)
HCT: 31.5 % — ABNORMAL LOW (ref 39.0–52.0)
HCT: 34.6 % — ABNORMAL LOW (ref 39.0–52.0)
HCT: 35 % — ABNORMAL LOW (ref 39.0–52.0)
HCT: 36.9 % — ABNORMAL LOW (ref 39.0–52.0)
HCT: 36.7 — ABNORMAL LOW
HCT: 41.8
Hemoglobin: 11.7 g/dL — ABNORMAL LOW (ref 13.0–17.0)
Hemoglobin: 10.9 g/dL — ABNORMAL LOW (ref 13.0–17.0)
Hemoglobin: 11.5 g/dL — ABNORMAL LOW (ref 13.0–17.0)
Hemoglobin: 9.4 g/dL — ABNORMAL LOW (ref 13.0–17.0)
Hemoglobin: 9.6 g/dL — ABNORMAL LOW (ref 13.0–17.0)
Hemoglobin: 12.2 — ABNORMAL LOW
Hemoglobin: 13.9
MCHC: 33.8 g/dL (ref 30.0–36.0)
MCHC: 33.1 g/dL (ref 30.0–36.0)
MCHC: 33.1 g/dL (ref 30.0–36.0)
MCHC: 33.2 g/dL (ref 30.0–36.0)
MCHC: 33.3 g/dL (ref 30.0–36.0)
MCHC: 34.3 g/dL (ref 30.0–36.0)
MCHC: 33.2
MCHC: 33.5
MCV: 92.9 fL (ref 78.0–100.0)
MCV: 93.2 fL (ref 78.0–100.0)
MCV: 94 fL (ref 78.0–100.0)
MCV: 94.5 fL (ref 78.0–100.0)
MCV: 95
MCV: 95.4
Platelets: 132 10*3/uL — ABNORMAL LOW (ref 150–400)
Platelets: 119 10*3/uL — ABNORMAL LOW (ref 150–400)
Platelets: 124 10*3/uL — ABNORMAL LOW (ref 150–400)
Platelets: 126 10*3/uL — ABNORMAL LOW (ref 150–400)
Platelets: 134 10*3/uL — ABNORMAL LOW (ref 150–400)
Platelets: 143 10*3/uL — ABNORMAL LOW (ref 150–400)
Platelets: 186 10*3/uL (ref 150–400)
Platelets: 199 10*3/uL (ref 150–400)
Platelets: 231 10*3/uL (ref 150–400)
Platelets: 108 — ABNORMAL LOW
Platelets: 121 — ABNORMAL LOW
RBC: 3.73 MIL/uL — ABNORMAL LOW (ref 4.22–5.81)
RBC: 2.97 MIL/uL — ABNORMAL LOW (ref 4.22–5.81)
RBC: 3.04 MIL/uL — ABNORMAL LOW (ref 4.22–5.81)
RBC: 3.28 MIL/uL — ABNORMAL LOW (ref 4.22–5.81)
RBC: 3.32 MIL/uL — ABNORMAL LOW (ref 4.22–5.81)
RBC: 3.47 MIL/uL — ABNORMAL LOW (ref 4.22–5.81)
RBC: 3.49 MIL/uL — ABNORMAL LOW (ref 4.22–5.81)
RBC: 3.64 MIL/uL — ABNORMAL LOW (ref 4.22–5.81)
RBC: 3.84 — ABNORMAL LOW
RDW: 14.1 % (ref 11.5–15.5)
RDW: 13.6 % (ref 11.5–15.5)
RDW: 14 % (ref 11.5–15.5)
RDW: 14.1 % (ref 11.5–15.5)
RDW: 14.1 % (ref 11.5–15.5)
RDW: 14.1 % (ref 11.5–15.5)
RDW: 14.2 % (ref 11.5–15.5)
RDW: 13.7
RDW: 14.1
RDW: 14.2
WBC: 5.1 10*3/uL (ref 4.0–10.5)
WBC: 5.5 10*3/uL (ref 4.0–10.5)
WBC: 6 10*3/uL (ref 4.0–10.5)
WBC: 6.6 10*3/uL (ref 4.0–10.5)
WBC: 7.4 10*3/uL (ref 4.0–10.5)
WBC: 7.9 10*3/uL (ref 4.0–10.5)
WBC: 9.3 10*3/uL (ref 4.0–10.5)
WBC: 9.4 10*3/uL (ref 4.0–10.5)
WBC: 6.6

## 2011-01-29 LAB — GLUCOSE, CAPILLARY
Glucose-Capillary: 151 mg/dL — ABNORMAL HIGH (ref 70–99)
Glucose-Capillary: 158 mg/dL — ABNORMAL HIGH (ref 70–99)
Glucose-Capillary: 158 mg/dL — ABNORMAL HIGH (ref 70–99)
Glucose-Capillary: 161 mg/dL — ABNORMAL HIGH (ref 70–99)
Glucose-Capillary: 174 mg/dL — ABNORMAL HIGH (ref 70–99)
Glucose-Capillary: 196 mg/dL — ABNORMAL HIGH (ref 70–99)
Glucose-Capillary: 113 mg/dL — ABNORMAL HIGH (ref 70–99)
Glucose-Capillary: 119 mg/dL — ABNORMAL HIGH (ref 70–99)
Glucose-Capillary: 132 mg/dL — ABNORMAL HIGH (ref 70–99)
Glucose-Capillary: 144 mg/dL — ABNORMAL HIGH (ref 70–99)
Glucose-Capillary: 15 mg/dL — CL (ref 70–99)
Glucose-Capillary: 158 mg/dL — ABNORMAL HIGH (ref 70–99)
Glucose-Capillary: 176 mg/dL — ABNORMAL HIGH (ref 70–99)
Glucose-Capillary: 176 mg/dL — ABNORMAL HIGH (ref 70–99)
Glucose-Capillary: 176 mg/dL — ABNORMAL HIGH (ref 70–99)
Glucose-Capillary: 178 mg/dL — ABNORMAL HIGH (ref 70–99)
Glucose-Capillary: 190 mg/dL — ABNORMAL HIGH (ref 70–99)
Glucose-Capillary: 191 mg/dL — ABNORMAL HIGH (ref 70–99)
Glucose-Capillary: 192 mg/dL — ABNORMAL HIGH (ref 70–99)
Glucose-Capillary: 195 mg/dL — ABNORMAL HIGH (ref 70–99)
Glucose-Capillary: 195 mg/dL — ABNORMAL HIGH (ref 70–99)
Glucose-Capillary: 196 mg/dL — ABNORMAL HIGH (ref 70–99)
Glucose-Capillary: 198 mg/dL — ABNORMAL HIGH (ref 70–99)
Glucose-Capillary: 199 mg/dL — ABNORMAL HIGH (ref 70–99)
Glucose-Capillary: 200 mg/dL — ABNORMAL HIGH (ref 70–99)
Glucose-Capillary: 200 mg/dL — ABNORMAL HIGH (ref 70–99)
Glucose-Capillary: 204 mg/dL — ABNORMAL HIGH (ref 70–99)
Glucose-Capillary: 205 mg/dL — ABNORMAL HIGH (ref 70–99)
Glucose-Capillary: 206 mg/dL — ABNORMAL HIGH (ref 70–99)
Glucose-Capillary: 208 mg/dL — ABNORMAL HIGH (ref 70–99)
Glucose-Capillary: 211 mg/dL — ABNORMAL HIGH (ref 70–99)
Glucose-Capillary: 211 mg/dL — ABNORMAL HIGH (ref 70–99)
Glucose-Capillary: 211 mg/dL — ABNORMAL HIGH (ref 70–99)
Glucose-Capillary: 212 mg/dL — ABNORMAL HIGH (ref 70–99)
Glucose-Capillary: 214 mg/dL — ABNORMAL HIGH (ref 70–99)
Glucose-Capillary: 214 mg/dL — ABNORMAL HIGH (ref 70–99)
Glucose-Capillary: 218 mg/dL — ABNORMAL HIGH (ref 70–99)
Glucose-Capillary: 220 mg/dL — ABNORMAL HIGH (ref 70–99)
Glucose-Capillary: 231 mg/dL — ABNORMAL HIGH (ref 70–99)
Glucose-Capillary: 231 mg/dL — ABNORMAL HIGH (ref 70–99)
Glucose-Capillary: 232 mg/dL — ABNORMAL HIGH (ref 70–99)
Glucose-Capillary: 236 mg/dL — ABNORMAL HIGH (ref 70–99)
Glucose-Capillary: 238 mg/dL — ABNORMAL HIGH (ref 70–99)
Glucose-Capillary: 244 mg/dL — ABNORMAL HIGH (ref 70–99)
Glucose-Capillary: 246 mg/dL — ABNORMAL HIGH (ref 70–99)
Glucose-Capillary: 251 mg/dL — ABNORMAL HIGH (ref 70–99)
Glucose-Capillary: 251 mg/dL — ABNORMAL HIGH (ref 70–99)
Glucose-Capillary: 257 mg/dL — ABNORMAL HIGH (ref 70–99)
Glucose-Capillary: 259 mg/dL — ABNORMAL HIGH (ref 70–99)
Glucose-Capillary: 260 mg/dL — ABNORMAL HIGH (ref 70–99)
Glucose-Capillary: 265 mg/dL — ABNORMAL HIGH (ref 70–99)
Glucose-Capillary: 275 mg/dL — ABNORMAL HIGH (ref 70–99)
Glucose-Capillary: 59 mg/dL — ABNORMAL LOW (ref 70–99)
Glucose-Capillary: 60 mg/dL — ABNORMAL LOW (ref 70–99)
Glucose-Capillary: 147 — ABNORMAL HIGH
Glucose-Capillary: 154 — ABNORMAL HIGH
Glucose-Capillary: 165 — ABNORMAL HIGH
Glucose-Capillary: 178 — ABNORMAL HIGH
Glucose-Capillary: 183 — ABNORMAL HIGH
Glucose-Capillary: 192 — ABNORMAL HIGH
Glucose-Capillary: 193 — ABNORMAL HIGH
Glucose-Capillary: 201 — ABNORMAL HIGH

## 2011-01-29 LAB — CULTURE, BLOOD (ROUTINE X 2)

## 2011-01-29 LAB — CLOSTRIDIUM DIFFICILE EIA

## 2011-01-29 LAB — RETICULOCYTES: Retic Count, Absolute: 53.9 10*3/uL (ref 19.0–186.0)

## 2011-01-29 LAB — PHOSPHORUS
Phosphorus: 2.7 mg/dL (ref 2.3–4.6)
Phosphorus: 3.5 mg/dL (ref 2.3–4.6)

## 2011-01-29 LAB — MAGNESIUM
Magnesium: 2.5 mg/dL (ref 1.5–2.5)
Magnesium: 2.6 mg/dL — ABNORMAL HIGH (ref 1.5–2.5)
Magnesium: 2.6 mg/dL — ABNORMAL HIGH (ref 1.5–2.5)
Magnesium: 2.6 mg/dL — ABNORMAL HIGH (ref 1.5–2.5)
Magnesium: 2.7 mg/dL — ABNORMAL HIGH (ref 1.5–2.5)

## 2011-01-29 LAB — POCT I-STAT 7, (LYTES, BLD GAS, ICA,H+H)
O2 Saturation: 100 %
Potassium: 3.7 mEq/L (ref 3.5–5.1)
Sodium: 151 mEq/L — ABNORMAL HIGH (ref 135–145)
TCO2: 25 mmol/L (ref 0–100)
pH, Arterial: 7.484 — ABNORMAL HIGH (ref 7.350–7.450)

## 2011-01-29 LAB — URINE CULTURE

## 2011-01-29 LAB — TYPE AND SCREEN
ABO/RH(D): A POS
Antibody Screen: NEGATIVE
Antibody Screen: NEGATIVE

## 2011-01-29 LAB — IRON AND TIBC
Iron: 26 ug/dL — ABNORMAL LOW (ref 42–135)
Saturation Ratios: 14 % — ABNORMAL LOW (ref 20–55)
TIBC: 187 ug/dL — ABNORMAL LOW (ref 215–435)

## 2011-01-29 LAB — CULTURE, BAL-QUANTITATIVE W GRAM STAIN

## 2011-01-29 LAB — FOLATE: Folate: >20 ng/mL

## 2011-01-29 LAB — APTT: aPTT: 26

## 2011-01-29 LAB — ABO/RH: ABO/RH(D): A POS

## 2011-01-31 LAB — GLUCOSE, CAPILLARY
Glucose-Capillary: 101 mg/dL — ABNORMAL HIGH (ref 70–99)
Glucose-Capillary: 103 mg/dL — ABNORMAL HIGH (ref 70–99)
Glucose-Capillary: 106 mg/dL — ABNORMAL HIGH (ref 70–99)
Glucose-Capillary: 109 mg/dL — ABNORMAL HIGH (ref 70–99)
Glucose-Capillary: 111 mg/dL — ABNORMAL HIGH (ref 70–99)
Glucose-Capillary: 113 mg/dL — ABNORMAL HIGH (ref 70–99)
Glucose-Capillary: 117 mg/dL — ABNORMAL HIGH (ref 70–99)
Glucose-Capillary: 121 mg/dL — ABNORMAL HIGH (ref 70–99)
Glucose-Capillary: 121 mg/dL — ABNORMAL HIGH (ref 70–99)
Glucose-Capillary: 123 mg/dL — ABNORMAL HIGH (ref 70–99)
Glucose-Capillary: 125 mg/dL — ABNORMAL HIGH (ref 70–99)
Glucose-Capillary: 128 mg/dL — ABNORMAL HIGH (ref 70–99)
Glucose-Capillary: 129 mg/dL — ABNORMAL HIGH (ref 70–99)
Glucose-Capillary: 132 mg/dL — ABNORMAL HIGH (ref 70–99)
Glucose-Capillary: 134 mg/dL — ABNORMAL HIGH (ref 70–99)
Glucose-Capillary: 145 mg/dL — ABNORMAL HIGH (ref 70–99)
Glucose-Capillary: 150 mg/dL — ABNORMAL HIGH (ref 70–99)
Glucose-Capillary: 153 mg/dL — ABNORMAL HIGH (ref 70–99)
Glucose-Capillary: 160 mg/dL — ABNORMAL HIGH (ref 70–99)
Glucose-Capillary: 163 mg/dL — ABNORMAL HIGH (ref 70–99)
Glucose-Capillary: 167 mg/dL — ABNORMAL HIGH (ref 70–99)
Glucose-Capillary: 170 mg/dL — ABNORMAL HIGH (ref 70–99)
Glucose-Capillary: 173 mg/dL — ABNORMAL HIGH (ref 70–99)
Glucose-Capillary: 182 mg/dL — ABNORMAL HIGH (ref 70–99)
Glucose-Capillary: 188 mg/dL — ABNORMAL HIGH (ref 70–99)
Glucose-Capillary: 191 mg/dL — ABNORMAL HIGH (ref 70–99)
Glucose-Capillary: 201 mg/dL — ABNORMAL HIGH (ref 70–99)
Glucose-Capillary: 206 mg/dL — ABNORMAL HIGH (ref 70–99)
Glucose-Capillary: 211 mg/dL — ABNORMAL HIGH (ref 70–99)
Glucose-Capillary: 215 mg/dL — ABNORMAL HIGH (ref 70–99)
Glucose-Capillary: 216 mg/dL — ABNORMAL HIGH (ref 70–99)
Glucose-Capillary: 217 mg/dL — ABNORMAL HIGH (ref 70–99)
Glucose-Capillary: 225 mg/dL — ABNORMAL HIGH (ref 70–99)
Glucose-Capillary: 227 mg/dL — ABNORMAL HIGH (ref 70–99)
Glucose-Capillary: 232 mg/dL — ABNORMAL HIGH (ref 70–99)
Glucose-Capillary: 235 mg/dL — ABNORMAL HIGH (ref 70–99)
Glucose-Capillary: 253 mg/dL — ABNORMAL HIGH (ref 70–99)
Glucose-Capillary: 258 mg/dL — ABNORMAL HIGH (ref 70–99)
Glucose-Capillary: 266 mg/dL — ABNORMAL HIGH (ref 70–99)
Glucose-Capillary: 58 mg/dL — ABNORMAL LOW (ref 70–99)
Glucose-Capillary: 61 mg/dL — ABNORMAL LOW (ref 70–99)
Glucose-Capillary: 62 mg/dL — ABNORMAL LOW (ref 70–99)
Glucose-Capillary: 65 mg/dL — ABNORMAL LOW (ref 70–99)
Glucose-Capillary: 69 mg/dL — ABNORMAL LOW (ref 70–99)
Glucose-Capillary: 70 mg/dL (ref 70–99)
Glucose-Capillary: 73 mg/dL (ref 70–99)
Glucose-Capillary: 74 mg/dL (ref 70–99)
Glucose-Capillary: 78 mg/dL (ref 70–99)
Glucose-Capillary: 78 mg/dL (ref 70–99)
Glucose-Capillary: 79 mg/dL (ref 70–99)
Glucose-Capillary: 86 mg/dL (ref 70–99)
Glucose-Capillary: 87 mg/dL (ref 70–99)
Glucose-Capillary: 89 mg/dL (ref 70–99)
Glucose-Capillary: 90 mg/dL (ref 70–99)
Glucose-Capillary: 98 mg/dL (ref 70–99)
Glucose-Capillary: 99 mg/dL (ref 70–99)

## 2011-01-31 LAB — COMPREHENSIVE METABOLIC PANEL
ALT: 32 U/L (ref 0–53)
AST: 21 U/L (ref 0–37)
Albumin: 2.1 g/dL — ABNORMAL LOW (ref 3.5–5.2)
Alkaline Phosphatase: 74 U/L (ref 39–117)
Alkaline Phosphatase: 84 U/L (ref 39–117)
BUN: 18 mg/dL (ref 6–23)
Chloride: 101 mEq/L (ref 96–112)
Glucose, Bld: 66 mg/dL — ABNORMAL LOW (ref 70–99)
Potassium: 3.2 mEq/L — ABNORMAL LOW (ref 3.5–5.1)
Potassium: 4 mEq/L (ref 3.5–5.1)
Sodium: 133 mEq/L — ABNORMAL LOW (ref 135–145)
Total Bilirubin: 0.5 mg/dL (ref 0.3–1.2)
Total Protein: 6.3 g/dL (ref 6.0–8.3)
Total Protein: 6.4 g/dL (ref 6.0–8.3)

## 2011-01-31 LAB — URINALYSIS, ROUTINE W REFLEX MICROSCOPIC
Ketones, ur: NEGATIVE mg/dL
Leukocytes, UA: NEGATIVE
Protein, ur: 30 mg/dL — AB
Urobilinogen, UA: 1 mg/dL (ref 0.0–1.0)

## 2011-01-31 LAB — BASIC METABOLIC PANEL
BUN: 10 mg/dL (ref 6–23)
BUN: 12 mg/dL (ref 6–23)
BUN: 13 mg/dL (ref 6–23)
CO2: 26 mEq/L (ref 19–32)
CO2: 27 mEq/L (ref 19–32)
Calcium: 8.5 mg/dL (ref 8.4–10.5)
Calcium: 8.6 mg/dL (ref 8.4–10.5)
Chloride: 107 mEq/L (ref 96–112)
Chloride: 109 mEq/L (ref 96–112)
Creatinine, Ser: 0.63 mg/dL (ref 0.4–1.5)
Creatinine, Ser: 0.66 mg/dL (ref 0.4–1.5)
Creatinine, Ser: 0.68 mg/dL (ref 0.4–1.5)
GFR calc Af Amer: >60 mL/min (ref >60)
GFR calc Af Amer: >60 mL/min (ref >60)
GFR calc Af Amer: >60 mL/min (ref >60)
GFR calc non Af Amer: >60 mL/min (ref >60)
GFR calc non Af Amer: >60 mL/min (ref >60)
GFR calc non Af Amer: >60 mL/min (ref >60)
Glucose, Bld: 66 mg/dL — ABNORMAL LOW (ref 70–99)
Glucose, Bld: 69 mg/dL — ABNORMAL LOW (ref 70–99)
Potassium: 3.3 mEq/L — ABNORMAL LOW (ref 3.5–5.1)
Potassium: 3.5 mEq/L (ref 3.5–5.1)
Potassium: 4 mEq/L (ref 3.5–5.1)
Sodium: 138 mEq/L (ref 135–145)
Sodium: 141 mEq/L (ref 135–145)

## 2011-01-31 LAB — CBC
HCT: 34.7 % — ABNORMAL LOW (ref 39.0–52.0)
HCT: 36.3 % — ABNORMAL LOW (ref 39.0–52.0)
Hemoglobin: 11.9 g/dL — ABNORMAL LOW (ref 13.0–17.0)
MCHC: 32.7 g/dL (ref 30.0–36.0)
MCV: 93.2 fL (ref 78.0–100.0)
Platelets: 167 10*3/uL (ref 150–400)
RDW: 14.4 % (ref 11.5–15.5)
RDW: 14.6 % (ref 11.5–15.5)
WBC: 6.4 10*3/uL (ref 4.0–10.5)

## 2011-01-31 LAB — URINE MICROSCOPIC-ADD ON

## 2011-01-31 LAB — CLOSTRIDIUM DIFFICILE EIA: C difficile Toxins A+B, EIA: NEGATIVE

## 2011-01-31 LAB — DIFFERENTIAL
Basophils Absolute: 0 10*3/uL (ref 0.0–0.1)
Basophils Absolute: 0 10*3/uL (ref 0.0–0.1)
Basophils Relative: 0 % (ref 0–1)
Basophils Relative: 1 % (ref 0–1)
Eosinophils Absolute: 0.4 10*3/uL (ref 0.0–0.7)
Eosinophils Relative: 6 % — ABNORMAL HIGH (ref 0–5)
Monocytes Absolute: 0.5 10*3/uL (ref 0.1–1.0)
Monocytes Relative: 7 % (ref 3–12)
Monocytes Relative: 8 % (ref 3–12)
Neutro Abs: 2.8 10*3/uL (ref 1.7–7.7)
Neutrophils Relative %: 53 % (ref 43–77)

## 2011-01-31 LAB — URINE CULTURE
Colony Count: NO GROWTH
Culture: NO GROWTH
Special Requests: NEGATIVE

## 2012-06-30 ENCOUNTER — Encounter: Payer: Self-pay | Admitting: Cardiology

## 2012-06-30 ENCOUNTER — Ambulatory Visit (INDEPENDENT_AMBULATORY_CARE_PROVIDER_SITE_OTHER): Payer: Medicare Other | Admitting: Cardiology

## 2012-06-30 VITALS — BP 150/80 | HR 83 | Wt 232.0 lb

## 2012-06-30 DIAGNOSIS — I2589 Other forms of chronic ischemic heart disease: Secondary | ICD-10-CM

## 2012-06-30 DIAGNOSIS — I255 Ischemic cardiomyopathy: Secondary | ICD-10-CM

## 2012-06-30 DIAGNOSIS — I2510 Atherosclerotic heart disease of native coronary artery without angina pectoris: Secondary | ICD-10-CM

## 2012-06-30 DIAGNOSIS — E119 Type 2 diabetes mellitus without complications: Secondary | ICD-10-CM

## 2012-06-30 DIAGNOSIS — I25810 Atherosclerosis of coronary artery bypass graft(s) without angina pectoris: Secondary | ICD-10-CM

## 2012-06-30 DIAGNOSIS — E669 Obesity, unspecified: Secondary | ICD-10-CM

## 2012-06-30 DIAGNOSIS — I10 Essential (primary) hypertension: Secondary | ICD-10-CM

## 2012-06-30 DIAGNOSIS — I499 Cardiac arrhythmia, unspecified: Secondary | ICD-10-CM

## 2012-06-30 DIAGNOSIS — E785 Hyperlipidemia, unspecified: Secondary | ICD-10-CM

## 2012-06-30 NOTE — Patient Instructions (Addendum)
The current medical regimen is effective;  continue present plan and medications.  Please have blood work today (BMP, CBC)  Your physician has requested that you have an echocardiogram. Echocardiography is a painless test that uses sound waves to create images of your heart. It provides your doctor with information about the size and shape of your heart and how well your heart's chambers and valves are working. This procedure takes approximately one hour. There are no restrictions for this procedure.  Follow up with Dr Antoine Poche in 1 month.  You may have the echo on the same day as your appointment with Dr Antoine Poche.

## 2012-06-30 NOTE — Progress Notes (Signed)
HPI The patient presents as a new patient for evaluation of coronary disease. I noticed a day to his actually in atrial fibrillation. He hasn't seen a heart doctor in some time. In 2009 he had a subdural hematoma and wasn't expected to live according to his family. Since he has survived they want to reestablish with cardiology. He has a history of coronary disease with CABG. He has an ischemic cardiomyopathy with an EF of 35% on cath in 2002. There may have been a history of atrial fibrillation as his family mentions cardioversion but I cannot verify this. He does not recall being on anticoagulation. He gets around somewhat with his chronic conditions. He does not fall. He walks through his house for short distances such as to this appointment. He seems to have some Cheyne-Stokes breathing but does not describe PND or orthopnea. He doesn't notice palpitations. He has no chest pressure, neck or arm discomfort. He's had no weight gain or edema.  No Known Allergies  Current Outpatient Prescriptions  Medication Sig Dispense Refill  . amoxicillin (AMOXIL) 500 MG capsule Take 500 mg by mouth. Prn dental      . atorvastatin (LIPITOR) 20 MG tablet Take 20 mg by mouth daily.      . furosemide (LASIX) 40 MG tablet Take 40 mg by mouth daily.      . GuaiFENesin (MUCINEX PO) Take by mouth as needed.      . insulin glargine (LANTUS) 100 UNIT/ML injection Inject 40 Units into the skin daily.      Marland Kitchen levETIRAcetam (KEPPRA) 500 MG tablet Take 500 mg by mouth 2 (two) times daily.      Marland Kitchen lisinopril (PRINIVIL,ZESTRIL) 10 MG tablet Take 10 mg by mouth daily.      . Loratadine (CLEAR-ATADINE PO) Take 1 tablet by mouth daily.      . meloxicam (MOBIC) 15 MG tablet Take 15 mg by mouth daily.      . NON FORMULARY Vit d2 1 time weekly      . oxybutynin (DITROPAN-XL) 5 MG 24 hr tablet Take 5 mg by mouth daily.       No current facility-administered medications for this visit.    Past Medical History  Diagnosis Date    . CAD (coronary artery disease)   . Diabetes   . Sleep apnea   . Cardiomyopathy, ischemic   . Obesity   . HTN (hypertension), benign   . Hyperlipidemia   . Subdural hematoma     Past Surgical History  Procedure Laterality Date  . Coronary artery bypass graft      1997 L - LAD, SVG D1, SVG OM, SVG RCA.  Grafts patent on cath 2002  . Hemorrhoid surgery    . Cholecystectomy    . Bilateral total knee replacements    . Nasal septum surgery      No family history on file.  History   Social History  . Marital Status: Married    Spouse Name: N/A    Number of Children: 3  . Years of Education: N/A   Occupational History  . Retired Soil scientist    Social History Main Topics  . Smoking status: Former Smoker    Types: Cigarettes  . Smokeless tobacco: Not on file     Comment: Distant past  . Alcohol Use: No  . Drug Use: No  . Sexually Active: Not on file   Other Topics Concern  . Not on file   Social  History Narrative   Lives with daughter and wife.     ROS:  Positive for dizziness, cough, swelling.  Otherwise as stated in the HPI and negative for all other systems.  PHYSICAL EXAM BP 150/80  Pulse 83  Wt 232 lb (105.235 kg)  BMI 34.24 kg/m2 GENERAL:  Well appearing HEENT:  Pupils equal round and reactive, fundi not visualized, oral mucosa unremarkable NECK:  No jugular venous distention, waveform within normal limits, carotid upstroke brisk and symmetric, no bruits, no thyromegaly LYMPHATICS:  No cervical, inguinal adenopathy LUNGS:  Clear to auscultation bilaterally BACK:  No CVA tenderness CHEST:  Unremarkable HEART:  PMI not displaced or sustained,S1 and S2 within normal limits, no S3,  no clicks, no rubs, no murmurs, irreugular ABD:  Flat, positive bowel sounds normal in frequency in pitch, no bruits, no rebound, no guarding, no midline pulsatile mass, no hepatomegaly, no splenomegaly EXT:  2 plus pulses throughout, no edema, no cyanosis no  clubbing SKIN:  No rashes no nodules NEURO:  Cranial nerves II through XII grossly intact, motor grossly intact throughout PSYCH:  Cognitively intact, oriented to person place and time   EKG:  Atrial fibrillation, rate 83, premature ectopic complexes, no acute ST-T wave changes. 06/30/2012  ASSESSMENT AND PLAN   ATRIAL FIBRILLATION:  The patient is unaware of this rhythm. He has no contraindication to anticoagulation.  Mr. Limmie Schoenberg has a CHA2DS2 - VASc score of 6 with a risk of stroke of 9.8%  and a HAS - BLED score of 2 with a low bleeding risk.  I had a long discussion with the patient, his wife and his daughter. I would like to check some blood work and will likely then start Eliquis.  I will consider a followup Holter to determine rate control.  ISCHEMIC CARDIOMYOPATHY:  I will check an echocardiogram to followup his ejection fraction. Based on this and the blood work I will then titrate medications.  I will have a conversation with him in the future about the possibility of an ICD. However, I suspect he wants relatively conservative therapy.  CAD:  He has no acute symptoms. He will continue with risk reduction.

## 2012-07-01 LAB — CBC
HCT: 46.1 % (ref 39.0–52.0)
Hemoglobin: 15 g/dL (ref 13.0–17.0)
MCHC: 32.6 g/dL (ref 30.0–36.0)
RDW: 14.2 % (ref 11.5–14.6)

## 2012-07-01 LAB — BASIC METABOLIC PANEL
Calcium: 9.5 mg/dL (ref 8.4–10.5)
Chloride: 103 mEq/L (ref 96–112)
Creatinine, Ser: 1.5 mg/dL (ref 0.4–1.5)

## 2012-08-03 ENCOUNTER — Ambulatory Visit (HOSPITAL_COMMUNITY): Payer: Medicare Other | Attending: Cardiovascular Disease | Admitting: Radiology

## 2012-08-03 ENCOUNTER — Ambulatory Visit (INDEPENDENT_AMBULATORY_CARE_PROVIDER_SITE_OTHER): Payer: Medicare Other | Admitting: Cardiology

## 2012-08-03 ENCOUNTER — Other Ambulatory Visit: Payer: Self-pay | Admitting: *Deleted

## 2012-08-03 ENCOUNTER — Encounter: Payer: Self-pay | Admitting: Cardiology

## 2012-08-03 VITALS — BP 114/68 | HR 60 | Ht 69.0 in | Wt 230.0 lb

## 2012-08-03 DIAGNOSIS — I2589 Other forms of chronic ischemic heart disease: Secondary | ICD-10-CM

## 2012-08-03 DIAGNOSIS — I10 Essential (primary) hypertension: Secondary | ICD-10-CM | POA: Insufficient documentation

## 2012-08-03 DIAGNOSIS — I2510 Atherosclerotic heart disease of native coronary artery without angina pectoris: Secondary | ICD-10-CM | POA: Insufficient documentation

## 2012-08-03 DIAGNOSIS — I4891 Unspecified atrial fibrillation: Secondary | ICD-10-CM

## 2012-08-03 DIAGNOSIS — I25810 Atherosclerosis of coronary artery bypass graft(s) without angina pectoris: Secondary | ICD-10-CM

## 2012-08-03 DIAGNOSIS — I499 Cardiac arrhythmia, unspecified: Secondary | ICD-10-CM | POA: Insufficient documentation

## 2012-08-03 MED ORDER — LISINOPRIL 10 MG PO TABS: 10.0000 mg | ORAL_TABLET | Freq: Two times a day (BID) | ORAL | Status: DC

## 2012-08-03 MED ORDER — APIXABAN 2.5 MG PO TABS: 2.5000 mg | ORAL_TABLET | Freq: Two times a day (BID) | ORAL | Status: DC

## 2012-08-03 MED ORDER — FUROSEMIDE 40 MG PO TABS: 40.0000 mg | ORAL_TABLET | Freq: Every day | ORAL | Status: DC

## 2012-08-03 NOTE — Progress Notes (Signed)
Echocardiogram performed.  

## 2012-08-03 NOTE — Progress Notes (Signed)
HPI The patient presents as a new patient for evaluation of coronary disease. He was noted to be in atrial fibrillation in the last appointment. That was his first appointment. He had not seen a cardiologist in years and was reestablishing with his history of ischemic cardiomyopathy and coronary disease. At that time we began the conversation of systemic anticoagulation. I did check labs. His creatinine is borderline at 1.5. He has mildly reduced platelets but he was not anemic. Since I last saw him he has had no new complaints. He continues to get dyspneic with activities.  He is limited in his activities. He does not describe PND or orthopnea. He's not having any palpitations, presyncope or syncope. He's not having any chest pressure, neck or arm discomfort.  No Known Allergies  Current Outpatient Prescriptions  Medication Sig Dispense Refill  . amoxicillin (AMOXIL) 500 MG capsule Take 500 mg by mouth. Prn dental      . atorvastatin (LIPITOR) 20 MG tablet Take 20 mg by mouth daily.      . furosemide (LASIX) 40 MG tablet Take 1 tablet (40 mg total) by mouth daily.  30 tablet  0  . GuaiFENesin (MUCINEX PO) Take by mouth as needed.      . insulin glargine (LANTUS) 100 UNIT/ML injection Inject 40 Units into the skin daily.      Marland Kitchen levETIRAcetam (KEPPRA) 500 MG tablet Take 500 mg by mouth 2 (two) times daily.      Marland Kitchen lisinopril (PRINIVIL,ZESTRIL) 10 MG tablet Take 10 mg by mouth daily.      . Loratadine (CLEAR-ATADINE PO) Take 1 tablet by mouth daily.      . meloxicam (MOBIC) 15 MG tablet Take 15 mg by mouth daily.      . NON FORMULARY Vit d2 1 time weekly      . oxybutynin (DITROPAN-XL) 5 MG 24 hr tablet Take 5 mg by mouth daily.       No current facility-administered medications for this visit.    Past Medical History  Diagnosis Date  . CAD (coronary artery disease)   . Diabetes   . Sleep apnea   . Cardiomyopathy, ischemic   . Obesity   . HTN (hypertension), benign   . Hyperlipidemia    . Subdural hematoma     Past Surgical History  Procedure Laterality Date  . Coronary artery bypass graft      1997 L - LAD, SVG D1, SVG OM, SVG RCA.  Grafts patent on cath 2002  . Hemorrhoid surgery    . Cholecystectomy    . Bilateral total knee replacements    . Nasal septum surgery      ROS:  Positive for dizziness, cough, swelling.  Otherwise as stated in the HPI and negative for all other systems.  PHYSICAL EXAM Ht 5\' 9"  (1.753 m)  Wt 230 lb (104.327 kg)  BMI 33.95 kg/m2 GEN:  No distress, frail NECK:  No jugular venous distention at 90 degrees, waveform within normal limits, carotid upstroke brisk and symmetric, no bruits, no thyromegaly LYMPHATICS:  No cervical adenopathy LUNGS:  Clear to auscultation bilaterally BACK:  No CVA tenderness CHEST:  Unremarkable HEART:  S1 and S2 within normal limits, no S3, no S4, no clicks, no rubs, no murmurs ABD:  Positive bowel sounds normal in frequency in pitch, no bruits, no rebound, no guarding, unable to assess midline mass or bruit with the patient seated. EXT:  2 plus pulses throughout, moderate edema, no cyanosis no clubbing  SKIN:  No rashes no nodules NEURO:  Cranial nerves II through XII grossly intact, motor grossly intact throughout PSYCH:  Cognitively intact, oriented to person place and time  EKG:  Atrial fibrillation, rate 72, premature ectopic complexes, no acute ST-T wave changes. 08/03/2012  ASSESSMENT AND PLAN   ATRIAL FIBRILLATION:  The patient is unaware of this rhythm. He has no contraindication to anticoagulation.  Mr. Estanislao Harmon has a CHA2DS2 - VASc score of 6 with a risk of stroke of 9.8%  and a HAS - BLED score of 2 with a low bleeding risk.  He does have slightly low platelets. I reviewed his echocardiogram. I have again had a long discussions with his family about the risks benefits of anticoagulation. After this informed conversation they agreed that they would prefer that he be on a blood thinner. I  will start him on Eliquis.  Although he does not technically meet the definition requiring dose reduction he is very close to meeting this definition and so I will use a reduced dose of 2.5 mg twice a day.  ISCHEMIC CARDIOMYOPATHY:  I reviewed the echocardiogram images. His ejection fraction is about 25%. This is only slightly lower than previous. Therefore, at this time I will try to titrate his lisinopril to 10 mg twice daily. In 2 weeks I will check a basic metabolic profile and a BNP level. I will have a conversation with him in the future about the possibility of an ICD. However, I suspect he wants relatively conservative therapy.  CAD:  He has no acute symptoms. He will continue with risk reduction.   (Greater than 40 minutes reviewing all data with greater than 50% face to face with the patient).

## 2012-08-03 NOTE — Patient Instructions (Addendum)
Please increase you Lisinopril to one 10 mg twice a day Start Eliqius 2.5 mg one twice a day. Continue all other medications as listed.  Please have blood work in 2 weeks at [facility]  (BMP and BNP)  Orders are in EPIC.  Follow up with Dr Antoine Poche in the [place] office.

## 2012-08-17 ENCOUNTER — Telehealth: Payer: Self-pay | Admitting: Nurse Practitioner

## 2012-08-17 NOTE — Telephone Encounter (Signed)
Pt left [facility] with a cpap provided by [facility]?  Services are not extended once patient leaves the facility.  Suggested contacting [facility] to see if they could take care of on going needs.

## 2012-08-20 ENCOUNTER — Telehealth: Payer: Self-pay | Admitting: Nurse Practitioner

## 2012-08-20 NOTE — Telephone Encounter (Signed)
Patient aware.

## 2012-08-20 NOTE — Telephone Encounter (Signed)
Please advise 

## 2012-08-20 NOTE — Telephone Encounter (Signed)
I think he should see his neurologist to make sure doesn't have something going on in his head with his history of subdural hemorrhage. If can't get appointment let me know- WHen you call to make appointment let them know about his change in mental status

## 2012-08-24 ENCOUNTER — Telehealth: Payer: Self-pay | Admitting: Nurse Practitioner

## 2012-08-24 MED ORDER — ATORVASTATIN CALCIUM 20 MG PO TABS: 20.0000 mg | ORAL_TABLET | Freq: Every day | ORAL | Status: DC

## 2012-08-24 NOTE — Telephone Encounter (Signed)
Please advise 

## 2012-08-24 NOTE — Telephone Encounter (Signed)
rx sent in 

## 2012-08-24 NOTE — Telephone Encounter (Signed)
Lipitor RX sent to [facility]

## 2012-08-25 NOTE — Telephone Encounter (Signed)
Pt aware med at [facility]

## 2012-08-26 ENCOUNTER — Other Ambulatory Visit: Payer: Self-pay | Admitting: *Deleted

## 2012-08-26 ENCOUNTER — Ambulatory Visit (INDEPENDENT_AMBULATORY_CARE_PROVIDER_SITE_OTHER): Payer: Medicare Other | Admitting: Nurse Practitioner

## 2012-08-26 ENCOUNTER — Ambulatory Visit (INDEPENDENT_AMBULATORY_CARE_PROVIDER_SITE_OTHER): Payer: Medicare Other | Admitting: Cardiology

## 2012-08-26 ENCOUNTER — Encounter: Payer: Self-pay | Admitting: Nurse Practitioner

## 2012-08-26 ENCOUNTER — Encounter: Payer: Self-pay | Admitting: Cardiology

## 2012-08-26 ENCOUNTER — Emergency Department (HOSPITAL_COMMUNITY): Payer: Medicare Other

## 2012-08-26 ENCOUNTER — Inpatient Hospital Stay (HOSPITAL_COMMUNITY)
Admission: EM | Admit: 2012-08-26 | Discharge: 2012-08-30 | DRG: 291 | Disposition: A | Discharge status: Home / Self Care | Payer: Medicare Other | Attending: Internal Medicine | Admitting: Internal Medicine

## 2012-08-26 ENCOUNTER — Ambulatory Visit (INDEPENDENT_AMBULATORY_CARE_PROVIDER_SITE_OTHER): Payer: Medicare Other

## 2012-08-26 ENCOUNTER — Ambulatory Visit: Payer: Medicare Other | Admitting: Nurse Practitioner

## 2012-08-26 ENCOUNTER — Encounter (HOSPITAL_COMMUNITY): Payer: Self-pay | Admitting: *Deleted

## 2012-08-26 VITALS — BP 110/50 | HR 60 | Ht 69.0 in

## 2012-08-26 VITALS — BP 111/70 | HR 68 | Temp 97.1°F | Resp 24

## 2012-08-26 DIAGNOSIS — Z7901 Long term (current) use of anticoagulants: Secondary | ICD-10-CM

## 2012-08-26 DIAGNOSIS — I255 Ischemic cardiomyopathy: Secondary | ICD-10-CM

## 2012-08-26 DIAGNOSIS — Z8719 Personal history of other diseases of the digestive system: Secondary | ICD-10-CM

## 2012-08-26 DIAGNOSIS — I4891 Unspecified atrial fibrillation: Secondary | ICD-10-CM

## 2012-08-26 DIAGNOSIS — I2510 Atherosclerotic heart disease of native coronary artery without angina pectoris: Secondary | ICD-10-CM

## 2012-08-26 DIAGNOSIS — R0602 Shortness of breath: Secondary | ICD-10-CM

## 2012-08-26 DIAGNOSIS — J811 Chronic pulmonary edema: Secondary | ICD-10-CM

## 2012-08-26 DIAGNOSIS — J329 Chronic sinusitis, unspecified: Secondary | ICD-10-CM

## 2012-08-26 DIAGNOSIS — Z860100 Personal history of colon polyps, unspecified: Secondary | ICD-10-CM

## 2012-08-26 DIAGNOSIS — E7800 Pure hypercholesterolemia, unspecified: Secondary | ICD-10-CM

## 2012-08-26 DIAGNOSIS — R7981 Abnormal blood-gas level: Secondary | ICD-10-CM

## 2012-08-26 DIAGNOSIS — E1129 Type 2 diabetes mellitus with other diabetic kidney complication: Secondary | ICD-10-CM | POA: Diagnosis present

## 2012-08-26 DIAGNOSIS — I499 Cardiac arrhythmia, unspecified: Secondary | ICD-10-CM

## 2012-08-26 DIAGNOSIS — E873 Alkalosis: Secondary | ICD-10-CM | POA: Diagnosis present

## 2012-08-26 DIAGNOSIS — I2589 Other forms of chronic ischemic heart disease: Secondary | ICD-10-CM

## 2012-08-26 DIAGNOSIS — I498 Other specified cardiac arrhythmias: Secondary | ICD-10-CM | POA: Diagnosis present

## 2012-08-26 DIAGNOSIS — R32 Unspecified urinary incontinence: Secondary | ICD-10-CM | POA: Diagnosis present

## 2012-08-26 DIAGNOSIS — Z96659 Presence of unspecified artificial knee joint: Secondary | ICD-10-CM

## 2012-08-26 DIAGNOSIS — R443 Hallucinations, unspecified: Secondary | ICD-10-CM | POA: Diagnosis present

## 2012-08-26 DIAGNOSIS — D696 Thrombocytopenia, unspecified: Secondary | ICD-10-CM

## 2012-08-26 DIAGNOSIS — Z66 Do not resuscitate: Secondary | ICD-10-CM | POA: Diagnosis present

## 2012-08-26 DIAGNOSIS — E11649 Type 2 diabetes mellitus with hypoglycemia without coma: Secondary | ICD-10-CM

## 2012-08-26 DIAGNOSIS — I5021 Acute systolic (congestive) heart failure: Secondary | ICD-10-CM

## 2012-08-26 DIAGNOSIS — R001 Bradycardia, unspecified: Secondary | ICD-10-CM

## 2012-08-26 DIAGNOSIS — E669 Obesity, unspecified: Secondary | ICD-10-CM | POA: Diagnosis present

## 2012-08-26 DIAGNOSIS — N179 Acute kidney failure, unspecified: Secondary | ICD-10-CM

## 2012-08-26 DIAGNOSIS — Z951 Presence of aortocoronary bypass graft: Secondary | ICD-10-CM

## 2012-08-26 DIAGNOSIS — I509 Heart failure, unspecified: Secondary | ICD-10-CM | POA: Diagnosis present

## 2012-08-26 DIAGNOSIS — J309 Allergic rhinitis, unspecified: Secondary | ICD-10-CM

## 2012-08-26 DIAGNOSIS — Z794 Long term (current) use of insulin: Secondary | ICD-10-CM

## 2012-08-26 DIAGNOSIS — I6200 Nontraumatic subdural hemorrhage, unspecified: Secondary | ICD-10-CM

## 2012-08-26 DIAGNOSIS — M1990 Unspecified osteoarthritis, unspecified site: Secondary | ICD-10-CM

## 2012-08-26 DIAGNOSIS — Z87891 Personal history of nicotine dependence: Secondary | ICD-10-CM

## 2012-08-26 DIAGNOSIS — Z79899 Other long term (current) drug therapy: Secondary | ICD-10-CM

## 2012-08-26 DIAGNOSIS — Z6834 Body mass index (BMI) 34.0-34.9, adult: Secondary | ICD-10-CM

## 2012-08-26 DIAGNOSIS — I5023 Acute on chronic systolic (congestive) heart failure: Principal | ICD-10-CM | POA: Diagnosis present

## 2012-08-26 DIAGNOSIS — I129 Hypertensive chronic kidney disease with stage 1 through stage 4 chronic kidney disease, or unspecified chronic kidney disease: Secondary | ICD-10-CM | POA: Diagnosis present

## 2012-08-26 DIAGNOSIS — G319 Degenerative disease of nervous system, unspecified: Secondary | ICD-10-CM

## 2012-08-26 DIAGNOSIS — E1169 Type 2 diabetes mellitus with other specified complication: Secondary | ICD-10-CM | POA: Diagnosis present

## 2012-08-26 DIAGNOSIS — R0902 Hypoxemia: Secondary | ICD-10-CM

## 2012-08-26 DIAGNOSIS — I10 Essential (primary) hypertension: Secondary | ICD-10-CM

## 2012-08-26 DIAGNOSIS — G4733 Obstructive sleep apnea (adult) (pediatric): Secondary | ICD-10-CM

## 2012-08-26 DIAGNOSIS — E785 Hyperlipidemia, unspecified: Secondary | ICD-10-CM

## 2012-08-26 DIAGNOSIS — R7989 Other specified abnormal findings of blood chemistry: Secondary | ICD-10-CM

## 2012-08-26 DIAGNOSIS — J9600 Acute respiratory failure, unspecified whether with hypoxia or hypercapnia: Secondary | ICD-10-CM | POA: Diagnosis present

## 2012-08-26 DIAGNOSIS — K5730 Diverticulosis of large intestine without perforation or abscess without bleeding: Secondary | ICD-10-CM

## 2012-08-26 DIAGNOSIS — N182 Chronic kidney disease, stage 2 (mild): Secondary | ICD-10-CM | POA: Diagnosis present

## 2012-08-26 DIAGNOSIS — E119 Type 2 diabetes mellitus without complications: Secondary | ICD-10-CM

## 2012-08-26 HISTORY — DX: Unspecified atrial fibrillation: I48.91

## 2012-08-26 HISTORY — DX: Chronic systolic (congestive) heart failure: I50.22

## 2012-08-26 HISTORY — DX: Obstructive sleep apnea (adult) (pediatric): G47.33

## 2012-08-26 LAB — GLUCOSE, CAPILLARY
Glucose-Capillary: 173 mg/dL — ABNORMAL HIGH (ref 70–99)
Glucose-Capillary: 22 mg/dL — CL (ref 70–99)
Glucose-Capillary: 225 mg/dL — ABNORMAL HIGH (ref 70–99)
Glucose-Capillary: 241 mg/dL — ABNORMAL HIGH (ref 70–99)
Glucose-Capillary: 248 mg/dL — ABNORMAL HIGH (ref 70–99)
Glucose-Capillary: 41 mg/dL — CL (ref 70–99)

## 2012-08-26 LAB — CBC WITH DIFFERENTIAL/PLATELET
Eosinophils Relative: 3 % (ref 0–5)
HCT: 41.1 % (ref 39.0–52.0)
HCT: 42.5 % (ref 39.0–52.0)
Hemoglobin: 13.7 g/dL (ref 13.0–17.0)
Hemoglobin: 14.2 g/dL (ref 13.0–17.0)
Lymphocytes Relative: 25 % (ref 12–46)
Lymphs Abs: 1.1 10*3/uL (ref 0.7–4.0)
Lymphs Abs: 1.8 10*3/uL (ref 0.7–4.0)
MCH: 29.5 pg (ref 26.0–34.0)
MCHC: 33.3 g/dL (ref 30.0–36.0)
MCV: 91.4 fL (ref 78.0–100.0)
Monocytes Absolute: 0.6 10*3/uL (ref 0.1–1.0)
Monocytes Relative: 7 % (ref 3–12)
Monocytes Relative: 8 % (ref 3–12)
Neutro Abs: 6 10*3/uL (ref 1.7–7.7)
Neutrophils Relative %: 76 % (ref 43–77)
Platelets: 132 10*3/uL — ABNORMAL LOW (ref 150–400)
RBC: 4.65 MIL/uL (ref 4.22–5.81)
RBC: 4.65 MIL/uL (ref 4.22–5.81)
WBC: 7.2 10*3/uL (ref 4.0–10.5)

## 2012-08-26 LAB — HEPATIC FUNCTION PANEL
Albumin: 2.8 g/dL — ABNORMAL LOW (ref 3.5–5.2)
Bilirubin, Direct: 0.2 mg/dL (ref 0.0–0.3)
Total Bilirubin: 0.7 mg/dL (ref 0.3–1.2)

## 2012-08-26 LAB — PRO B NATRIURETIC PEPTIDE: Pro B Natriuretic peptide (BNP): 10457 pg/mL — ABNORMAL HIGH (ref 0–450)

## 2012-08-26 LAB — POCT I-STAT 3, ART BLOOD GAS (G3+)
Acid-Base Excess: 3 mmol/L — ABNORMAL HIGH (ref 0.0–2.0)
Bicarbonate: 25.7 mEq/L — ABNORMAL HIGH (ref 20.0–24.0)
O2 Saturation: 98 %
TCO2: 27 mmol/L (ref 0–100)
pO2, Arterial: 101 mmHg — ABNORMAL HIGH (ref 80.0–100.0)

## 2012-08-26 LAB — BASIC METABOLIC PANEL
BUN: 31 mg/dL — ABNORMAL HIGH (ref 6–23)
CO2: 28 mEq/L (ref 19–32)
Glucose, Bld: 112 mg/dL — ABNORMAL HIGH (ref 70–99)
Potassium: 4.1 mEq/L (ref 3.5–5.3)
Sodium: 143 mEq/L (ref 135–145)

## 2012-08-26 LAB — URINALYSIS, ROUTINE W REFLEX MICROSCOPIC
Glucose, UA: NEGATIVE mg/dL
Ketones, ur: NEGATIVE mg/dL
Protein, ur: 100 mg/dL — AB

## 2012-08-26 LAB — AMMONIA: Ammonia: 20 umol/L (ref 11–60)

## 2012-08-26 LAB — COMPREHENSIVE METABOLIC PANEL
ALT: 24 U/L (ref 0–53)
AST: 29 U/L (ref 0–37)
CO2: 29 mEq/L (ref 19–32)
Chloride: 109 mEq/L (ref 96–112)
Creatinine, Ser: 1.73 mg/dL — ABNORMAL HIGH (ref 0.50–1.35)
GFR calc non Af Amer: 34 mL/min — ABNORMAL LOW (ref >90)
Total Bilirubin: 0.5 mg/dL (ref 0.3–1.2)

## 2012-08-26 LAB — URINE MICROSCOPIC-ADD ON

## 2012-08-26 MED ORDER — INSULIN GLARGINE 100 UNIT/ML ~~LOC~~ SOLN: 40.0000 [IU] | Freq: Every day | SUBCUTANEOUS | Status: DC

## 2012-08-26 MED ORDER — ATORVASTATIN CALCIUM 20 MG PO TABS
20.0000 mg | ORAL_TABLET | Freq: Every day | ORAL | Status: DC
  Administered 2012-08-27 – 2012-08-30 (×4): 20 mg via ORAL
  Filled 2012-08-26 (×5): qty 1

## 2012-08-26 MED ORDER — DEXTROSE 50 % IV SOLN
INTRAVENOUS | Status: AC
  Filled 2012-08-26: qty 50

## 2012-08-26 MED ORDER — INSULIN ASPART 100 UNIT/ML ~~LOC~~ SOLN
0.0000 [IU] | Freq: Three times a day (TID) | SUBCUTANEOUS | Status: DC
  Administered 2012-08-27: 1 [IU] via SUBCUTANEOUS
  Administered 2012-08-28: 2 [IU] via SUBCUTANEOUS
  Administered 2012-08-28: 1 [IU] via SUBCUTANEOUS
  Administered 2012-08-29 (×2): 2 [IU] via SUBCUTANEOUS
  Administered 2012-08-30: 1 [IU] via SUBCUTANEOUS

## 2012-08-26 MED ORDER — DARIFENACIN HYDROBROMIDE ER 7.5 MG PO TB24
7.5000 mg | ORAL_TABLET | Freq: Every day | ORAL | Status: DC
  Administered 2012-08-27 – 2012-08-30 (×4): 7.5 mg via ORAL
  Filled 2012-08-26 (×5): qty 1

## 2012-08-26 MED ORDER — DEXTROSE 50 % IV SOLN
50.0000 mL | Freq: Once | INTRAVENOUS | Status: AC
  Administered 2012-08-26: 50 mL via INTRAVENOUS

## 2012-08-26 MED ORDER — FUROSEMIDE 10 MG/ML IJ SOLN
40.0000 mg | Freq: Two times a day (BID) | INTRAMUSCULAR | Status: DC
  Administered 2012-08-27 – 2012-08-28 (×4): 40 mg via INTRAVENOUS
  Filled 2012-08-26 (×8): qty 4

## 2012-08-26 MED ORDER — CLONIDINE HCL 0.1 MG PO TABS
0.1000 mg | ORAL_TABLET | Freq: Two times a day (BID) | ORAL | Status: DC
  Administered 2012-08-27 – 2012-08-29 (×6): 0.1 mg via ORAL
  Filled 2012-08-26 (×9): qty 1

## 2012-08-26 MED ORDER — LEVETIRACETAM 500 MG PO TABS
500.0000 mg | ORAL_TABLET | Freq: Two times a day (BID) | ORAL | Status: DC
  Administered 2012-08-27 – 2012-08-30 (×7): 500 mg via ORAL
  Filled 2012-08-26 (×9): qty 1

## 2012-08-26 MED ORDER — GLUCOSE-VITAMIN C 4-6 GM-MG PO CHEW
CHEWABLE_TABLET | ORAL | Status: AC
  Filled 2012-08-26: qty 2

## 2012-08-26 MED ORDER — APIXABAN 2.5 MG PO TABS
2.5000 mg | ORAL_TABLET | Freq: Two times a day (BID) | ORAL | Status: DC
  Administered 2012-08-27 – 2012-08-30 (×7): 2.5 mg via ORAL
  Filled 2012-08-26 (×9): qty 1

## 2012-08-26 MED ORDER — GLUCAGON HCL (RDNA) 1 MG IJ SOLR: 1.0000 mg | Freq: Once | INTRAMUSCULAR | Status: DC

## 2012-08-26 MED ORDER — FUROSEMIDE 10 MG/ML IJ SOLN
40.0000 mg | Freq: Once | INTRAMUSCULAR | Status: AC
  Administered 2012-08-26: 40 mg via INTRAVENOUS
  Filled 2012-08-26: qty 4

## 2012-08-26 MED ORDER — OXYBUTYNIN CHLORIDE ER 5 MG PO TB24
5.0000 mg | ORAL_TABLET | Freq: Every day | ORAL | Status: DC
  Administered 2012-08-27 – 2012-08-30 (×4): 5 mg via ORAL
  Filled 2012-08-26 (×5): qty 1

## 2012-08-26 MED ORDER — DEXTROSE-NACL 5-0.45 % IV SOLN: INTRAVENOUS | Status: DC

## 2012-08-26 MED ORDER — SODIUM CHLORIDE 0.9 % IJ SOLN
3.0000 mL | Freq: Two times a day (BID) | INTRAMUSCULAR | Status: DC
  Administered 2012-08-27: 22:00:00 via INTRAVENOUS
  Administered 2012-08-28 – 2012-08-29 (×2): 3 mL via INTRAVENOUS

## 2012-08-26 NOTE — ED Notes (Signed)
Family at bedside. 

## 2012-08-26 NOTE — Progress Notes (Signed)
Pt. Refused bipap for tonight. 

## 2012-08-26 NOTE — H&P (Signed)
Taylor Jacobson  Taylor Jacobson NGE:[id number] DOB: Oct 17, 1926 DOA: 08/26/2012  Referring physician: Beverely Pace, ED resident PCP: Rudi Heap, MD  Specialists:  Cardiology  Chief Complaint: SOB  HPI: Taylor Jacobson is a 79 y.o. male with past CAd h/o and just started to see Dr. Antoine Poche of Cardiology for the first time about 1 month ago and was noted to be in atrial fibrillation at time an echocardiogram was performed showing an EF of may be 25-35% with diffuse hypokinesis secondary to H. fibrillation  He returned for a follow-up visit 08/26/12 and due to some SOB noted at the Cardiology office he was sent her from his PCP's office.  O2 sats noted at that time at her primary care provider's office were in the 80s on 3 use of oxygen and an outpatient chest x-ray was performed showing potentially CHF. He is a poor hisotrioan and his daughter and wife reports he has bbeen breathing heavy for about 1 month.  He hasn't require dmore pillows at home, but he has had more fatigued over the same period of time.  He used to be able to walk his dog aroudn the house but can now only walk 3-4 feet. When I examined him today he states that on lying flat he feels more short of breath when he lies flat. He denies any overt chest pain. His wife and daughter report increasing confusion and hallucinations maybe over the course of the past one month which coincides with the increasing shortness of breath. His daughter and wife relates he has had no dark stool tarry stool blurred vision double vision cough fever or sputum chills nausea vomiting abdominal pain or other issues recently  Review of Systems:   Past Medical History  Diagnosis Date  . CAD (coronary artery disease)   . Diabetes   . Sleep apnea   . Cardiomyopathy, ischemic   . Obesity   . HTN (hypertension), benign   . Hyperlipidemia   . Subdural hematoma    Chart review Peripheral vasc disease followed by Dr.  Edilia Bo Admission 12.4.09 for Large L Subduaral hemorrhage Admission 08/29/03 for L TKR Admission 10/08/00 for R TKR Cardiac cath  S/p CABG 1997 x 4   Past Surgical History  Procedure Laterality Date  . Coronary artery bypass graft      1997 L - LAD, SVG D1, SVG OM, SVG RCA.  Grafts patent on cath 2002  . Hemorrhoid surgery    . Cholecystectomy    . Bilateral total knee replacements    . Nasal septum surgery     Social History:  reports that he has quit smoking. His smoking use included Cigarettes. He smoked 0.00 packs per day. He does not have any smokeless tobacco history on file. He reports that he does not drink alcohol or use illicit drugs.   No Known Allergies  No family history on file. hypertension  Prior to Admission medications   Medication Sig Start Date End Date Taking? Authorizing Provider  apixaban (ELIQUIS) 2.5 MG TABS tablet Take 1 tablet (2.5 mg total) by mouth 2 (two) times daily. 08/03/12  Yes Rollene Rotunda, MD  atorvastatin (LIPITOR) 20 MG tablet Take 1 tablet (20 mg total) by mouth daily. 08/24/12  Yes Mary-Margaret Daphine Deutscher, FNP  cloNIDine (CATAPRES) 0.1 MG tablet Take 0.1 mg by mouth 2 (two) times daily.   Yes Historical Provider, MD  furosemide (LASIX) 40 MG tablet Take 1 tablet (40 mg total) by mouth daily. 08/03/12  Yes  Mary-Margaret Daphine Deutscher, FNP  insulin glargine (LANTUS) 100 UNIT/ML injection Inject 40 Units into the skin daily.   Yes Historical Provider, MD  levETIRAcetam (KEPPRA) 500 MG tablet Take 500 mg by mouth 2 (two) times daily.   Yes Historical Provider, MD  lisinopril (PRINIVIL,ZESTRIL) 10 MG tablet Take 1 tablet (10 mg total) by mouth 2 (two) times daily. 08/03/12  Yes Rollene Rotunda, MD  meloxicam (MOBIC) 15 MG tablet Take 15 mg by mouth daily.   Yes Historical Provider, MD  oxybutynin (DITROPAN-XL) 5 MG 24 hr tablet Take 5 mg by mouth daily.   Yes Historical Provider, MD  solifenacin (VESICARE) 5 MG tablet Take 5 mg by mouth daily.   Yes Historical  Provider, MD  amoxicillin (AMOXIL) 500 MG capsule Take 500 mg by mouth. Prn dental    Historical Provider, MD  NON FORMULARY Vit d2 1 time weekly    Historical Provider, MD   Jacobson Exam: Filed Vitals:   08/26/12 1504 08/26/12 1507 08/26/12 1627  BP: 130/72  126/77  Pulse: 77  79  Temp: 98.1 F (36.7 C)    TempSrc: Oral    Resp: 26  18  SpO2: 92% 100% 95%     General:  Alert obese Caucasian male no apparent distress  Eyes: No pallor icterus  ENT: Soft supple moderate dentition  Neck: Neck soft mild JVD no bruit  Cardiovascular: S1-S2 irregular rate rhythm but rate controlled  Respiratory: Clinically clear at present  Abdomen: Soft nontender nondistended  Skin: Grade 1 lower extremity edema  Musculoskeletal: Range of motion intact  Psychiatric: Euthymic but confused  Neurologic: Power grossly normal  Labs on Admission:  Basic Metabolic Panel:  Recent Labs Lab 08/26/12 1524  NA 146*  K 4.2  CL 109  CO2 29  GLUCOSE 65*  BUN 29*  CREATININE 1.73*  CALCIUM 8.6   Liver Function Tests:  Recent Labs Lab 08/26/12 1524  AST 29  ALT 24  ALKPHOS 86  BILITOT 0.5  PROT 6.8  ALBUMIN 2.8*   No results found for this basename: LIPASE, AMYLASE,  in the last 168 hours No results found for this basename: AMMONIA,  in the last 168 hours CBC:  Recent Labs Lab 08/26/12 1524  WBC 7.2  NEUTROABS 4.5  HGB 14.2  HCT 42.5  MCV 91.4  PLT 132*   Cardiac Enzymes:  Recent Labs Lab 08/26/12 1524  TROPONINI <0.30    BNP (last 3 results)  Recent Labs  08/26/12 1524  PROBNP 10457.0*   CBG: No results found for this basename: GLUCAP,  in the last 168 hours  Radiological Exams on Admission: Dg Chest 2 View  08/26/2012  *RADIOLOGY REPORT*  Clinical Data: Shortness of breath.  CHEST - 2 VIEW  Comparison: None.  Findings: Image quality is degraded by motion.  Heart is enlarged. Diffuse interstitial prominence and indistinctness.  Question mild coarsening.   Small bilateral pleural effusions.  IMPRESSION: Probable congestive heart failure.  Question underlying chronic lung disease.   Original Report Authenticated By: Leanna Battles, M.D.    Ct Head Wo Contrast  08/26/2012  *RADIOLOGY REPORT*  Clinical Data: Confusion.  Short of breath  CT HEAD WITHOUT CONTRAST  Technique:  Contiguous axial images were obtained from the base of the skull through the vertex without contrast.  Comparison: CT 02/15/2012  Findings: Prior left sided craniotomy for unknown procedure.  There is encephalomalacia in the left parietal cortex which is unchanged.  Generalized atrophy of a moderate degree.  Moderate  chronic microvascular ischemia in the white matter.  Negative for acute infarct.  Negative for intracranial hemorrhage or mass.  Atherosclerotic calcification in the cavernous carotid bilaterally. Sinusitis with multiple air-fluid levels.  This was present on the prior study and shows mild interval improvement.  IMPRESSION: Atrophy and chronic ischemia.  No acute infarct or hemorrhage  Pansinusitis with multiple air-fluid levels, mild improvement since 02/15/2012.   Original Report Authenticated By: Janeece Riggers, M.D.    Dg Chest Portable 1 View  08/26/2012  *RADIOLOGY REPORT*  Clinical Data: Chest pain, shortness of breath  PORTABLE CHEST - 1 VIEW  Comparison: April 02, 2012.  Findings: Stable cardiomegaly.  Sternotomy wires are noted. Increased bilateral basilar opacities are noted consistent with atelectasis or edema with mild bilateral pleural effusions.  No pneumothorax is noted.  IMPRESSION: Cardiomegaly with bilateral basilar opacities concerning for atelectasis or edema with associated pleural effusions.  These findings are concerning for some degree of congestive heart failure.   Original Report Authenticated By: Lupita Raider.,  M.D.     EKG: Independently reviewed. Each of fibrillation with slow ventricular response multiple PVCs axis -10 no ST-T wave changes  although convergence noted in V5 and 6 that are somewhat new compared to 2009 EKG  Assessment/Plan Principal Problem:   Heart failure, acute systolic, first episode Active Problems:   HYPERCHOLESTEROLEMIA   OBESITY   history of HEMORRHAGE, SUBDURAL   DIVERTICULOSIS, COLON   CAD (coronary artery disease)   Diabetes   Atrial fibrillation   1. Decompensated systolic heart failure, recent EF 08/03/12 25-30%-admit to telemetry, diuresis with IV Lasix 40 mg IV bid. Strict in and out, daily weight, low-salt diet-potentially will need further adjustments to medications made by cardiology inclusive of addition of may be Aldactone-[please consult cardiology in the morning ]he is also on meloxicam chronically which will be discontinued as this can worsen heart failure 2. Altered mental status-unclear etiology potentially related to hypoxia. Get LFTs, ammonia level, TSH. 3. ? Obstructive sleep apnea-his daughter confirms that he does use a CPAP machine at night but she does not know the settings. We will order auto titration of BiPAP on the floor at night. 4. Acute kidney injury/ckd2-his baseline creatinine seems to be 0.6-0.9 and today BUN/creatinine ratio 29/1.73. I will discontinue his lisinopril temporarily as this had been increased recently to twice a day from 10 g once a day. We will diuresis him and if needed consult nephrology. 5. Urinary incontinence-he will continue darifenacin [Replacement for VESIcare 5 mg] as well as Ditropan XL 5 mg 6. History CAD-as patient is currently on Elliquis will defer platelet therapy 7. Atrial fibrillation-CHA2DS2 - VASc score of 6 with a risk of stroke of 9.8% and a HAS - BLED score of 2 with a low bleeding risk-patient has also a history of subdural hemorrhage on Plavix so this needs to be watched carefully.   not on any rate controlling agent 8. Hyperlipidemia-continue atorvastatin 20 mg daily 9. Diabetes mellitus type 2-continue Lantus 40 units subcutaneous  daily, place on diabetic diet. Sliding scale to be ordered 10. Hypertension continue clonidine 0.1 mg twice a day-lisinopril discontinued. May need a beta blocker   Please consult cardiology   Code Status: DO NOT RESUSCITATE  Family Communication: Discussed with family in detail the plan of care  Disposition Plan: Inpatient telemetry   Time spent: 75 minute  Mahala Menghini [facility] Pager [phone]  If 7PM-7AM, please contact night-coverage [email or web address] Password [facility] 08/26/2012, 5:05 PM

## 2012-08-26 NOTE — Progress Notes (Signed)
  Subjective:    Patient ID: Taylor Jacobson, male    DOB: [birth year], 79 y.o.   MRN: [id number]  HPI Patient was next door seeing Dr. [place] Lions for follow-up- Very SOB while there- Dr. [place] Lions sent him here for chest X-ray and labs. O2 SAt was 80 on RA- Uses O2 at home at night. When arrived here his O2 sat was 77% on RA. Stated he didn't feel SOB but using accessory muscles to breathe. Noted some confusion when talking with him. When asked his age he said 79 years old    Review of Systems  Constitutional: Negative for fever and chills.  Respiratory: Positive for shortness of breath. Negative for chest tightness.   Cardiovascular: Positive for palpitations. Negative for chest pain and leg swelling.  Psychiatric/Behavioral: Positive for confusion.       Objective:   Physical Exam  Constitutional: He appears well-developed and well-nourished.  Cardiovascular:  Sinus bardycardia EKG done at [facility] office was Unchanged from previous according to Dr.  Lions  Pulmonary/Chest: He is in respiratory distress.  Skin: Skin is warm.  Psychiatric: He has a normal mood and affect. His behavior is normal.  Unable to answer questions appropriately. Not always able to understand patient when speaking   Chest X-ray-  ?CHF- Preliminary reading by Paulene Floor, FNP  [facility]  O2 via nasal cannulla @ 1:30-- O2 Sat on 2L O2 via canula 91% IV KVO left wrist- NS        Assessment & Plan:  SOB  TO  via EMS

## 2012-08-26 NOTE — Patient Instructions (Addendum)
The current medical regimen is effective;  continue present plan and medications.  Please have blood work today at [facility], (BNP,BMP AND CBC)  A chest x-ray takes a picture of the organs and structures inside the chest, including the heart, lungs, and blood vessels. This test can show several things, including, whether the heart is enlarges; whether fluid is building up in the lungs; and whether pacemaker / defibrillator leads are still in place.  Follow up Dr Antoine Poche in 2 months.

## 2012-08-26 NOTE — Progress Notes (Signed)
HPI The patient presents as a new patient for evaluation of coronary disease and atrial fibrillation.In 2009 he had a subdural hematoma and wasn't expected to live according to his family. Since he has survived they reestablished with cardiology recently. He has a history of coronary disease with CABG. He has an ischemic cardiomyopathy with an EF of 35% on cath in 2002.   Since our first visit in March I did start him on systemic anticoagulation given his high risk for stroke. I also did an echocardiogram and noted that his ejection fraction is now about 25%. I am titrating his medications.  At the last visit I increased his lisinopril to twice daily and he did okay with this.  Today he came in to the waiting area and his oxygen saturation off of oxygen was 83%. It came up to 99% with supplemental oxygen. He says he's only been using this at night. However, the person accompanying him says that he probably has been more dyspneic during the day. He seems to have had any for his cough which he describes as brown or green but he's not had any fevers or chills. He's not had any new PND or orthopnea. He's had no palpitations, presyncope or syncope. He denies any chest pressure, neck or arm discomfort. There may be some occasional blood tinging of the sputum but he otherwise seems to have tolerated the anticoagulation. He was having Cheyne-Stokes respirations previously and seems to still be having this.  He has had no weight gain or edema.   No Known Allergies  Current Outpatient Prescriptions  Medication Sig Dispense Refill  . amoxicillin (AMOXIL) 500 MG capsule Take 500 mg by mouth. Prn dental      . apixaban (ELIQUIS) 2.5 MG TABS tablet Take 1 tablet (2.5 mg total) by mouth 2 (two) times daily.  60 tablet  6  . atorvastatin (LIPITOR) 20 MG tablet Take 1 tablet (20 mg total) by mouth daily.  30 tablet  3  . cloNIDine (CATAPRES) 0.1 MG tablet Take 0.1 mg by mouth 2 (two) times daily.      . furosemide  (LASIX) 40 MG tablet Take 1 tablet (40 mg total) by mouth daily.  30 tablet  0  . GuaiFENesin (MUCINEX PO) Take by mouth as needed.      . insulin glargine (LANTUS) 100 UNIT/ML injection Inject 40 Units into the skin daily.      Marland Kitchen levETIRAcetam (KEPPRA) 500 MG tablet Take 500 mg by mouth 2 (two) times daily.      Marland Kitchen lisinopril (PRINIVIL,ZESTRIL) 10 MG tablet Take 1 tablet (10 mg total) by mouth 2 (two) times daily.  60 tablet  6  . Loratadine (CLEAR-ATADINE PO) Take 1 tablet by mouth daily.      . meloxicam (MOBIC) 15 MG tablet Take 15 mg by mouth daily.      . NON FORMULARY Vit d2 1 time weekly      . oxybutynin (DITROPAN-XL) 5 MG 24 hr tablet Take 5 mg by mouth daily.      . solifenacin (VESICARE) 5 MG tablet Take 5 mg by mouth daily.       No current facility-administered medications for this visit.    Past Medical History  Diagnosis Date  . CAD (coronary artery disease)   . Diabetes   . Sleep apnea   . Cardiomyopathy, ischemic   . Obesity   . HTN (hypertension), benign   . Hyperlipidemia   . Subdural hematoma  Past Surgical History  Procedure Laterality Date  . Coronary artery bypass graft      1997 L - LAD, SVG D1, SVG OM, SVG RCA.  Grafts patent on cath 2002  . Hemorrhoid surgery    . Cholecystectomy    . Bilateral total knee replacements    . Nasal septum surgery      No family history on file.  History   Social History  . Marital Status: Married    Spouse Name: N/A    Number of Children: 3  . Years of Education: N/A   Occupational History  . Retired Soil scientist    Social History Main Topics  . Smoking status: Former Smoker    Types: Cigarettes  . Smokeless tobacco: Not on file     Comment: Distant past  . Alcohol Use: No  . Drug Use: No  . Sexually Active: Not on file   Other Topics Concern  . Not on file   Social History Narrative   Lives with daughter and wife.     ROS:  Positive for cough.  Otherwise as stated in the HPI and  negative for all other systems.  PHYSICAL EXAM BP 110/50  Pulse 60  Ht 5\' 9"  (1.753 m) GENERAL:  Frail appearing, but no distress HEENT:  Pupils equal round and reactive, fundi not visualized, oral mucosa unremarkable NECK:  No jugular venous distention, waveform within normal limits, carotid upstroke brisk and symmetric, no bruits, no thyromegaly LYMPHATICS:  No cervical, inguinal adenopathy LUNGS:  Clear to auscultation bilaterally BACK:  No CVA tenderness CHEST:  Unremarkable HEART:  PMI not displaced or sustained,S1 and S2 within normal limits, no S3,  no clicks, no rubs, no murmurs, irreugular ABD:  Flat, positive bowel sounds normal in frequency in pitch, no bruits, no rebound, no guarding, no midline pulsatile mass, no hepatomegaly, no splenomegaly EXT:  2 plus pulses throughout, no edema, no cyanosis no clubbing, dependent rubor SKIN:  No rashes no nodules NEURO:  Cranial nerves II through XII grossly intact, motor grossly intact throughout PSYCH:  Cognitively intact, oriented to person place and time, speech is slow  ASSESSMENT AND PLAN   ATRIAL FIBRILLATION:  The patient is unaware of this rhythm. He has no contraindication to anticoagulation.  Taylor Jacobson has a CHA2DS2 - VASc score of 6 with a risk of stroke of 9.8%  and a HAS - BLED score of 2 with a low bleeding risk.  He will remain on the Eliquis.  I will check a CBC today.  ISCHEMIC CARDIOMYOPATHY:  Today I will not titrate his medication. I'm concerned that he might have a respiratory process going on other than heart failure. I will repeat a basic metabolic profile and a BNP today. I will send him for a chest x-ray and discuss this with his primary provider. She will likely need to prescribe the oxygen around the clock for now.  CAD:  He has no acute symptoms. He will continue with risk reduction.

## 2012-08-26 NOTE — ED Notes (Signed)
Per EMS pt from [facility] and sent here for further evaluation of shortness of breath. o2 sats 77% on 2L. Denies chest pain. EMS reports tachypnea. Lung sounds clear. CXR showed mild congestion. BP 140/72. HR 72 A-Fib. R 28. CBG 93. IV 20G L Hand.

## 2012-08-26 NOTE — ED Provider Notes (Signed)
History     CSN: [id number]  Arrival date & time 08/26/12  1455   First MD Initiated Contact with Patient 08/26/12 1503      Chief Complaint  Patient presents with  . Shortness of Breath    (Consider location/radiation/quality/duration/timing/severity/associated sxs/prior treatment) HPI Comments: 32 y M with PMH of CAD s/p CABG, afib on Eliquis, HTN, HLD, obesity, ischemic CM, IDDM, SDH s/p craniotomy here for further eval after hypoxia/SOB noted at his Cardiologist's office.  He uses 2L [place] O2 at night and CPAP.  His daughter and wife have noted increasing confusion over the past month with Taylor Taylor Jacobson seeing "snakes" and a "fuzzy haired man".  No falls.  They also report that he developed new urinary incontinence yesterday, as well.  No fevers, vomiting, diarrhea, rash.  They deny that the pt has reported CP, HA or any other complaints.  Pt does not report any complaints, but he is confused on exam.   Patient is a 79 y.o. male presenting with shortness of breath. The history is provided by a relative.  Shortness of Breath Severity:  Moderate Onset quality:  Gradual Timing:  Constant Progression:  Worsening Chronicity:  New Exacerbated by: lying flat.   Past Medical History  Diagnosis Date  . CAD (coronary artery disease)   . Diabetes   . Sleep apnea   . Cardiomyopathy, ischemic   . Obesity   . HTN (hypertension), benign   . Hyperlipidemia   . Subdural hematoma     Past Surgical History  Procedure Laterality Date  . Coronary artery bypass graft      1997 L - LAD, SVG D1, SVG OM, SVG RCA.  Grafts patent on cath 2002  . Hemorrhoid surgery    . Cholecystectomy    . Bilateral total knee replacements    . Nasal septum surgery      No family history on file.  History  Substance Use Topics  . Smoking status: Former Smoker    Types: Cigarettes  . Smokeless tobacco: Not on file     Comment: Distant past  . Alcohol Use: No     Review of Systems  Unable to perform ROS:  Mental status change  Respiratory: Positive for shortness of breath.     Allergies  Review of patient's allergies indicates no known allergies.  Home Medications   Current Outpatient Rx  Name  Route  Sig  Dispense  Refill  . amoxicillin (AMOXIL) 500 MG capsule   Oral   Take 500 mg by mouth. Prn dental         . apixaban (ELIQUIS) 2.5 MG TABS tablet   Oral   Take 1 tablet (2.5 mg total) by mouth 2 (two) times daily.   60 tablet   6   . atorvastatin (LIPITOR) 20 MG tablet   Oral   Take 1 tablet (20 mg total) by mouth daily.   30 tablet   3   . cloNIDine (CATAPRES) 0.1 MG tablet   Oral   Take 0.1 mg by mouth 2 (two) times daily.         . furosemide (LASIX) 40 MG tablet   Oral   Take 1 tablet (40 mg total) by mouth daily.   30 tablet   0     Needs to be seen before next refill   . GuaiFENesin (MUCINEX PO)   Oral   Take by mouth as needed.         . insulin glargine (  LANTUS) 100 UNIT/ML injection   Subcutaneous   Inject 40 Units into the skin daily.         Marland Kitchen levETIRAcetam (KEPPRA) 500 MG tablet   Oral   Take 500 mg by mouth 2 (two) times daily.         Marland Kitchen lisinopril (PRINIVIL,ZESTRIL) 10 MG tablet   Oral   Take 1 tablet (10 mg total) by mouth 2 (two) times daily.   60 tablet   6   . Loratadine (CLEAR-ATADINE PO)   Oral   Take 1 tablet by mouth daily.         . meloxicam (MOBIC) 15 MG tablet   Oral   Take 15 mg by mouth daily.         . NON FORMULARY      Vit d2 1 time weekly         . oxybutynin (DITROPAN-XL) 5 MG 24 hr tablet   Oral   Take 5 mg by mouth daily.         . solifenacin (VESICARE) 5 MG tablet   Oral   Take 5 mg by mouth daily.           BP 126/77  Pulse 79  Temp(Src) 98.1 F (36.7 C) (Oral)  Resp 18  SpO2 95%  Physical Exam  Vitals reviewed. Constitutional: He is oriented to person, place, and time. He appears well-developed and well-nourished. No distress.  HENT:  Head: Normocephalic.  Right Ear:  External ear normal.  Left Ear: External ear normal.  Nose: Nose normal.  Mouth/Throat: Oropharynx is clear and moist. No oropharyngeal exudate.  Eyes: Conjunctivae and EOM are normal. Pupils are equal, round, and reactive to light.  Neck: Normal range of motion. Neck supple.  Cardiovascular: Normal rate, normal heart sounds and intact distal pulses.  An irregularly irregular rhythm present. Exam reveals no gallop and no friction rub.   No murmur heard. Pulmonary/Chest: Effort normal. He has rales (all lung fields, increasing inferiorly).  tachypnea intermittently  Abdominal: Soft. Bowel sounds are normal. He exhibits no distension. There is no tenderness.  Musculoskeletal: Normal range of motion. He exhibits edema (1+). He exhibits no tenderness.  Neurological: He is alert and oriented to person, place, and time. No cranial nerve deficit.  Skin: Skin is warm and dry.  Psychiatric: He has a normal mood and affect.    ED Course  Procedures (including critical care time)  Labs Reviewed  CBC WITH DIFFERENTIAL - Abnormal; Notable for the following:    Platelets 132 (*)    All other components within normal limits  PRO B NATRIURETIC PEPTIDE - Abnormal; Notable for the following:    Pro B Natriuretic peptide (BNP) 10457.0 (*)    All other components within normal limits  COMPREHENSIVE METABOLIC PANEL - Abnormal; Notable for the following:    Sodium 146 (*)    Glucose, Bld 65 (*)    BUN 29 (*)    Creatinine, Ser 1.73 (*)    Albumin 2.8 (*)    GFR calc non Af Amer 34 (*)    GFR calc Af Amer 40 (*)    All other components within normal limits  URINALYSIS, ROUTINE W REFLEX MICROSCOPIC - Abnormal; Notable for the following:    APPearance HAZY (*)    Hgb urine dipstick LARGE (*)    Protein, ur 100 (*)    All other components within normal limits  HEPATIC FUNCTION PANEL - Abnormal; Notable for the following:  Albumin 2.8 (*)    All other components within normal limits  URINE  MICROSCOPIC-ADD ON - Abnormal; Notable for the following:    Casts HYALINE CASTS (*)    All other components within normal limits  POCT I-STAT 3, BLOOD GAS (G3+) - Abnormal; Notable for the following:    pH, Arterial 7.493 (*)    pCO2 arterial 33.5 (*)    pO2, Arterial 101.0 (*)    Bicarbonate 25.7 (*)    Acid-Base Excess 3.0 (*)    All other components within normal limits  TROPONIN I  CG4 I-STAT (LACTIC ACID)   Dg Chest 2 View  08/26/2012  *RADIOLOGY REPORT*  Clinical Data: Shortness of breath.  CHEST - 2 VIEW  Comparison: None.  Findings: Image quality is degraded by motion.  Heart is enlarged. Diffuse interstitial prominence and indistinctness.  Question mild coarsening.  Small bilateral pleural effusions.  IMPRESSION: Probable congestive heart failure.  Question underlying chronic lung disease.   Original Report Authenticated By: Leanna Battles, M.D.     Date: 08/26/2012  Rate: 70  Rhythm: atrial fibrillation with slow VR  QRS Axis: left  Intervals: normal  ST/T Wave abnormalities: TWI in V1-V6, lead I, STD, V2-V3  Conduction Disutrbances:none  Narrative Interpretation: Afib with slow VR.  New TWI and STD compared to EKG 03/05/2008  Old EKG Reviewed: changes noted     1. Heart failure, acute systolic, first episode   2. Atrial fibrillation   3. Hypoxia   4. Pulmonary edema       MDM   17 y M with PMH of CAD s/p CABG, afib on Eliquis, HTN, HLD, obesity, ischemic CM, IDDM, SDH s/p craniotomy here for further eval after hypoxia/SOB noted at his Cardiologist's office.  He uses 2L [place] O2 at night and CPAP.  His daughter and wife have noted increasing confusion over the past month with Taylor Jacobson seeing "snakes" and a "fuzzy haired man".  No falls.  They also report that he developed new urinary incontinence yesterday, as well.  No fevers, vomiting, diarrhea, rash.  They deny that the pt has reported CP, HA or any other complaints.  Pt does not report any complaints, but he is  confused on exam.  Afebrile here, irr irr, BPs OK.  Sats in upper 90s on 3L O2.  Taylor Hosley is only oriented to name, with innapropriate responses to all other questioning.  Crackles in all lung fields.  1+ edema.  CXR from PCP with edema.  EKG with TWI in anterior leads, afib with slow VR.  Diff Dx: CHF, PNA, PE, Sepsis, ACS, CVA.  Doubt PE as pt is now on eliquis.  CBC, CMP, lactate, ABG, trop, bnp, CT head, UA.    5:00 PM BNP 10k.  Lasix 80 mg ordered.  Blood gas OK.  Hospitalist consulted for admission.  Disposition: Admit  Condition: Stable  Pt seen in conjunction with my attending, Dr. Manus Gunning.  Oleh Genin, MD PGY-II [facility] Emergency Medicine Resident       Oleh Genin, MD 08/27/12 [phone]

## 2012-08-26 NOTE — ED Notes (Signed)
Dr. Mahala Menghini with hospitalist notified of CBG, pt is alert and oriented. Given D 50. New orders to recheck CBG at 2000, and Q2hr. Notify floor coverage.

## 2012-08-26 NOTE — ED Notes (Signed)
CBG 30, pt given OJ. CBG 22, GIven IV D50 and [place] dsandwich and one apple juice. Dr. Manus Gunning notified, will continue to monitor

## 2012-08-27 ENCOUNTER — Encounter (HOSPITAL_COMMUNITY): Payer: Self-pay | Admitting: *Deleted

## 2012-08-27 DIAGNOSIS — I5023 Acute on chronic systolic (congestive) heart failure: Principal | ICD-10-CM

## 2012-08-27 DIAGNOSIS — I4891 Unspecified atrial fibrillation: Secondary | ICD-10-CM

## 2012-08-27 DIAGNOSIS — E119 Type 2 diabetes mellitus without complications: Secondary | ICD-10-CM

## 2012-08-27 DIAGNOSIS — I2510 Atherosclerotic heart disease of native coronary artery without angina pectoris: Secondary | ICD-10-CM

## 2012-08-27 DIAGNOSIS — E1169 Type 2 diabetes mellitus with other specified complication: Secondary | ICD-10-CM

## 2012-08-27 DIAGNOSIS — R001 Bradycardia, unspecified: Secondary | ICD-10-CM | POA: Diagnosis present

## 2012-08-27 DIAGNOSIS — D696 Thrombocytopenia, unspecified: Secondary | ICD-10-CM | POA: Diagnosis present

## 2012-08-27 LAB — COMPREHENSIVE METABOLIC PANEL
Albumin: 2.8 g/dL — ABNORMAL LOW (ref 3.5–5.2)
Alkaline Phosphatase: 82 U/L (ref 39–117)
BUN: 29 mg/dL — ABNORMAL HIGH (ref 6–23)
Calcium: 8.7 mg/dL (ref 8.4–10.5)
Creatinine, Ser: 1.79 mg/dL — ABNORMAL HIGH (ref 0.50–1.35)
Potassium: 4.1 mEq/L (ref 3.5–5.1)
Total Protein: 6.8 g/dL (ref 6.0–8.3)

## 2012-08-27 LAB — GLUCOSE, CAPILLARY
Glucose-Capillary: 115 mg/dL — ABNORMAL HIGH (ref 70–99)
Glucose-Capillary: 120 mg/dL — ABNORMAL HIGH (ref 70–99)
Glucose-Capillary: 158 mg/dL — ABNORMAL HIGH (ref 70–99)
Glucose-Capillary: 89 mg/dL (ref 70–99)
Glucose-Capillary: 94 mg/dL (ref 70–99)

## 2012-08-27 LAB — HEMOGLOBIN A1C
Hgb A1c MFr Bld: 6.7 % — ABNORMAL HIGH (ref <5.7)
Mean Plasma Glucose: 146 mg/dL — ABNORMAL HIGH (ref <117)

## 2012-08-27 LAB — CBC
HCT: 42.5 % (ref 39.0–52.0)
Hemoglobin: 14.1 g/dL (ref 13.0–17.0)
MCV: 91.6 fL (ref 78.0–100.0)
RBC: 4.64 MIL/uL (ref 4.22–5.81)
WBC: 7.7 10*3/uL (ref 4.0–10.5)

## 2012-08-27 LAB — PROTIME-INR: INR: 1.16 (ref 0.00–1.49)

## 2012-08-27 LAB — VITAMIN B12: Vitamin B-12: 405 pg/mL (ref 211–911)

## 2012-08-27 LAB — TSH: TSH: 3.742 u[IU]/mL (ref 0.350–4.500)

## 2012-08-27 LAB — TROPONIN I: Troponin I: <0.3 ng/mL (ref <0.30)

## 2012-08-27 MED ORDER — LISINOPRIL 5 MG PO TABS
5.0000 mg | ORAL_TABLET | Freq: Every day | ORAL | Status: DC
  Administered 2012-08-27 – 2012-08-30 (×4): 5 mg via ORAL
  Filled 2012-08-27 (×4): qty 1

## 2012-08-27 NOTE — Consult Note (Signed)
CARDIOLOGY CONSULT NOTE  Patient ID: Taylor Jacobson MRN: [id number], DOB/AGE: [birth year]   Admit date: 08/26/2012 Date of Consult: 08/27/2012  Primary Physician: Rudi Heap, MD Primary Cardiologist: Shela Commons. Hochrein, MD  Pt. Profile  79 y/o male with h/o CAD and ICM, who was admitted yesterday with acute resp failure and chf.  Problem List  Past Medical History  Diagnosis Date  . CAD (coronary artery disease)     a. 1997 s/p CABG x 4: LIMA->LAD, VG->Diag, VG->OM, VG->dRCA;  b. 07/2000 Cath: LM nl, LAD 62m, RI 99p, LCX 100p, RCA 90, VG->RCA nl, VG->OM nl, VG->Diag nl, LIMA->LAD nl.  . Diabetes   . Chronic systolic CHF (congestive heart failure)     a. 07/2012 Echo: EF 25-30%, diff HK, triv AI, mild MR, mod dil LA, mod reduced RV syst fxn, mildly dil RA.  . Cardiomyopathy, ischemic   . Obesity   . HTN (hypertension), benign   . Hyperlipidemia   . Subdural hematoma     a. 2009.  Marland Kitchen OSA (obstructive sleep apnea)     a. uses CPAP  . Atrial fibrillation     a. Eliquis started 06/2012.    Past Surgical History  Procedure Laterality Date  . Coronary artery bypass graft      1997 L - LAD, SVG D1, SVG OM, SVG RCA.  Grafts patent on cath 2002  . Hemorrhoid surgery    . Cholecystectomy    . Bilateral total knee replacements    . Nasal septum surgery      Allergies  No Known Allergies  HPI   79 y/o male with the above complex problem list.  He is s/p CABG x 4 in 1997 and subsequently cathed in 2002 revealing native multivessel dzs and 4/4 patent grafts.  He had known ICM and was medically managed and followed by cardiology until 2009, when he suffered a subdural hematoma.  Though he was not initially expected to make a full recovery, he did and as recent as 2 mos ago, he was doing well, walking his dog and going on shopping trips with his family w/o significant limitations of symptoms.  About 1 month ago however, his dtr noticed that he would become dyspneic with minimal exertion.  At  the same time he began to experience an intermittently productive cough and he often hyperventilated.  Further, she began to note that he would be confused or report hallucinations.  She arranged for him to get back in touch with cardiology and he saw Dr. Antoine Poche in March.  He was found to be in afib and given an elevated CHA2DS2VASc, he was placed on eliquis.  An echo was performed and continued to show reduced LV fxn, with an EF of 25-30%.  His ACEI was subsequently titrated.  Per his dtr, he has continued to have progressive doe/easy fatigability.  Pt says that he has not had chest pain and further denies any recent h/o pnd, orthopnea, n, v, dizziness, syncope, or edema.  His family believes that his appetite has waned some however.  His weight, has been stable in the 230 lbs range.  Yesterday, saw Dr. Antoine Poche for routine f/u in the [place] office.  He was noticeably dyspneic and hypoxic with O2 sat of 83% on RA, which rose to 99% with O2.  He was sent to [facility] for a CXR and there, he was again dyspneic and hypoxic - O2 sat of 77%.  He was also disoriented and confused.  Decision was made  to send him to the ED for evaluation.  Here, CXR showed CHF and pBNP was elevated.  Creat was also elevated to 1.79.  ABG showed resp alkalosis.  He was admitted by IM and placed on IV lasix.  Inpatient Medications  . apixaban  2.5 mg Oral BID  . atorvastatin  20 mg Oral Daily  . cloNIDine  0.1 mg Oral BID  . darifenacin  7.5 mg Oral Daily  . furosemide  40 mg Intravenous Q12H  . insulin aspart  0-9 Units Subcutaneous TID WC  . levETIRAcetam  500 mg Oral BID  . lisinopril  5 mg Oral Daily  . oxybutynin  5 mg Oral Daily  . sodium chloride  3 mL Intravenous Q12H    Family History Family History  Problem Relation Age of Onset  . Other Mother     died during childbirth.  . Stroke Father      Social History History   Social History  . Marital Status: Married    Spouse Name: N/A    Number of Children:  3  . Years of Education: N/A   Occupational History  . Retired Soil scientist    Social History Main Topics  . Smoking status: Former Smoker    Types: Cigarettes  . Smokeless tobacco: Not on file     Comment: Distant past  . Alcohol Use: No  . Drug Use: No  . Sexually Active: Not on file   Other Topics Concern  . Not on file   Social History Narrative   Lives with daughter and wife in [place].     Review of Systems  General:  No chills, fever, night sweats or weight changes.  Has been confused more recently. Cardiovascular:  No chest pain, +++ dyspnea on exertion & at rest.  No edema, orthopnea, palpitations, paroxysmal nocturnal dyspnea.  Probable early satiety per family. Dermatological: No rash, lesions/masses Respiratory: +++ cough- intermittently productive with green/grey sputum.  +++ dyspnea Urologic: No hematuria, dysuria Abdominal:   No nausea, vomiting, diarrhea, bright red blood per rectum, melena, or hematemesis Neurologic:  No visual changes, wkns, +++ changes in mental status - confusion/disorientation/hallucinations. All other systems reviewed and are otherwise negative except as noted above.  Physical Exam  Blood pressure 125/74, pulse 53, temperature 97.6 F (36.4 C), temperature source Oral, resp. rate 18, height 5\' 9"  (1.753 m), weight 225 lb 6.4 oz (102.241 kg), SpO2 94.00%.  General: Pleasant, NAD Psych: Normal affect. Neuro: Alert and oriented to self. Confused.  Moves all extremities spontaneously and follows commands. HEENT: Normal  Neck: Supple without bruits.  JVD to jaw. Lungs:  He frequently hyperventilates w/o signs of distress before or after.  He has crackles 1/3 way up bilat. Heart: ir ir, distant.  no s3, s4, or murmurs. Abdomen: Soft, non-tender, non-distended, BS + x 4.  Extremities: No clubbing, cyanosis or edema. DP/PT/Radials 1+ and equal bilaterally.  Labs  Pro B Natriuretic peptide (BNP) Latest Range: 0-450 pg/mL 10457.0  (H)     Recent Labs  08/26/12 1524 08/27/12 0920  TROPONINI <0.30 <0.30   Lab Results  Component Value Date   WBC 7.7 08/27/2012   HGB 14.1 08/27/2012   HCT 42.5 08/27/2012   MCV 91.6 08/27/2012   PLT 136* 08/27/2012     Recent Labs Lab 08/27/12 0505  NA 145  K 4.1  CL 103  CO2 33*  BUN 29*  CREATININE 1.79*  CALCIUM 8.7  PROT 6.8  BILITOT 0.7  ALKPHOS 82  ALT 22  AST 24  GLUCOSE 88   Radiology/Studies  Dg Chest 2 View  08/26/2012  *RADIOLOGY REPORT*  Clinical Data: Shortness of breath.  CHEST - 2 VIEW  Comparison: None.  Findings: Image quality is degraded by motion.  Heart is enlarged. Diffuse interstitial prominence and indistinctness.  Question mild coarsening.  Small bilateral pleural effusions.  IMPRESSION: Probable congestive heart failure.  Question underlying chronic lung disease.   Original Report Authenticated By: Leanna Battles, M.D.    Ct Head Wo Contrast  08/26/2012  *RADIOLOGY REPORT*  Clinical Data: Confusion.  Short of breath  CT HEAD WITHOUT CONTRAST  Technique:  Contiguous axial images were obtained from the base of the skull through the vertex without contrast.  Comparison: CT 02/15/2012  Findings: Prior left sided craniotomy for unknown procedure.  There is encephalomalacia in the left parietal cortex which is unchanged.  Generalized atrophy of a moderate degree.  Moderate chronic microvascular ischemia in the white matter.  Negative for acute infarct.  Negative for intracranial hemorrhage or mass.  Atherosclerotic calcification in the cavernous carotid bilaterally. Sinusitis with multiple air-fluid levels.  This was present on the prior study and shows mild interval improvement.  IMPRESSION: Atrophy and chronic ischemia.  No acute infarct or hemorrhage  Pansinusitis with multiple air-fluid levels, mild improvement since 02/15/2012.   Original Report Authenticated By: Janeece Riggers, M.D.    Dg Chest Portable 1 View  08/26/2012  *RADIOLOGY REPORT*  Clinical Data:  Chest pain, shortness of breath  PORTABLE CHEST - 1 VIEW  Comparison: April 02, 2012.  Findings: Stable cardiomegaly.  Sternotomy wires are noted. Increased bilateral basilar opacities are noted consistent with atelectasis or edema with mild bilateral pleural effusions.  No pneumothorax is noted.  IMPRESSION: Cardiomegaly with bilateral basilar opacities concerning for atelectasis or edema with associated pleural effusions.  These findings are concerning for some degree of congestive heart failure.   Original Report Authenticated By: Lupita Raider.,  M.D.    ECG  Afib, pvc's, ivcd, lat st/t changes.  No acute st/t changes.  ASSESSMENT AND PLAN  1.  Acute on chronic systolic CHF/ICM:  Pt presented yesterday after 1 month of worsening doe and persistent cough.  CXR showed edema, while pBNP was markedly elevated.  He has crackles on exam with elevated neck veins.  Agree with IV diuresis.  Cont acei therapy @ current dose.  Notably, pt was not on bb @ home previously.  Will consider once his volume status is improved.  His family says that his weight, if anything, has been down (248->230), but also admit to occasional dietary indiscretion and use of prepackaged meals (had microwave biscuit for breakfast yesterday).  2.  CAD:  He has not had any chest pain.  Initial troponin is nl.  As it's been 12 years since last cath, we will consider noninvasive ischemic testing as an outpatient to determine possible role of ischemia in Ss/CHF.  Cont statin.  No asa 2/2 eliquis usage.  3.  Afib:  Rate controlled w/o rate-controlling agent.  Anticoagulated with eliquis.  CHA2DS2 - VASc score of 6 with a risk of stroke of 9.8% and a HAS - BLED score of 2 with a low bleeding risk.  4.  Acute on chronic kidney injury:  Follow creat with diuresis.  Acei dose reduced back to 5mg  daily for the time being.  5.  HTN:  BP elevated this AM.  Follow with diuresis.   6.  Confusion/hallucinations:  Per  IM.  Signed, Nicolasa Ducking, NP 08/27/2012, 12:02 PM  Patient seen with NP, agree with the above note.   1. Acute/chronic systolic CHF: Dyspnea, wet on CXR, elevated BNP, elevated JVP.  He is diuresing well so far with Lasix 40 mg IV bid, will continue for now.  Follow I/Os and exam and adjust as needed.  Continue lisinopril at 5 mg daily for now.  Add Coreg 3.125 mg bid when volume status is improved.   2. CAD: h/o CABG.  No chest pain. Consider outpatient functional study.  I do not think that current presentation is indicative of ACS.  3. CKD: Baseline creatinine around 1.5.  Watch creatinine carefully with diuresis.  4. Atrial fibrillation: HR not elevated.  Continue adjusted-dose Eliquis.  Marca Ancona 08/27/2012 1:08 PM

## 2012-08-27 NOTE — ED Provider Notes (Signed)
I saw and evaluated the patient, reviewed the resident's note and I agree with the findings and plan. If applicable, I agree with the resident's interpretation of the EKG.  If applicable, I was present for critical portions of any procedures performed.  From PCP's office with SOB and hypoxia. Mild tachypnea, crackles at bases. Hx ischemic cardiomyopathy with EF 25%. Worsening confusion over past month.  Exam consistent with CHF exacerbation. No CO2 retention on ABG. IV lasix, ASA. Nitro as Bp tolerates.  Glynn Octave, MD 08/27/12 1213

## 2012-08-27 NOTE — Progress Notes (Signed)
UR completed.    Wyeth Hoffer Wise Eilleen Davoli, RN, BSN Phone #[phone]  

## 2012-08-27 NOTE — Progress Notes (Signed)
CRITICAL VALUE ALERT  Critical value received: troponin 0.38  Date of notification:  08/27/2012  Time of notification:  1633  Critical value read back:yes  Nurse who received alert:  Janine Ores RN  MD notified (1st page):  Dr Sherrie Mustache  Time of first page:  1634  MD notified (2nd page):  Time of second page:  Responding MD: Dr Sherrie Mustache Time MD responded:  [phone]

## 2012-08-27 NOTE — Progress Notes (Signed)
Pt. Has his home cpap from home, but refuses to wear it. Pt. States he does not want to wear his cpap tonight. RT informed pt. to notify if he changes his mind. RN is aware as well.

## 2012-08-27 NOTE — Progress Notes (Signed)
Subjective: The patient appears a little confused this morning, but says that he is breathing better. He denies chest pain. He denies abdominal pain, nausea, or vomiting. He does not recall his blood sugar being low overnight.  Objective: Vital signs in last 24 hours: Filed Vitals:   08/26/12 1815 08/26/12 1830 08/26/12 2026 08/27/12 0418  BP: 139/95 115/69 122/62 139/95  Pulse: 37 40 70 53  Temp:   97.7 F (36.5 C) 97.6 F (36.4 C)  TempSrc:   Oral Oral  Resp: 30 24 22 18   Height:   5\' 9"  (1.753 m)   Weight:   104.509 kg (230 lb 6.4 oz) 102.241 kg (225 lb 6.4 oz)  SpO2: 84% 95% 92% 94%    Intake/Output Summary (Last 24 hours) at 08/27/12 0943 Last data filed at 08/27/12 0820  Gross per 24 hour  Intake    360 ml  Output   4000 ml  Net  -3640 ml    Weight change:   Physical exam: Morbidly obese 79 year old Caucasian man sitting up in bed, in no acute distress. Lungs: A few crackles auscultated in the mid lobes and in the lower lobes. Breathing is nonlabored. Heart: Distant irregular, irregular. Abdomen: Obese, positive bowel sounds, soft, mildly tender in the hypogastrium. No masses palpated. No appreciable distention. Query mild ascites. Extremities: Trace of pedal edema bilaterally. Neurologic: He is alert. He has difficulty recalling events. He is aware that he is in the hospital. Cranial nerves II through XII appear to be grossly intact.  Lab Results: Basic Metabolic Panel:  Recent Labs  16/10/96 1524 08/27/12 0505  NA 146* 145  K 4.2 4.1  CL 109 103  CO2 29 33*  GLUCOSE 65* 88  BUN 29* 29*  CREATININE 1.73* 1.79*  CALCIUM 8.6 8.7   Liver Function Tests:  Recent Labs  08/26/12 1748 08/27/12 0505  AST 31 24  ALT 22 22  ALKPHOS 84 82  BILITOT 0.7 0.7  PROT 6.8 6.8  ALBUMIN 2.8* 2.8*   No results found for this basename: LIPASE, AMYLASE,  in the last 72 hours  Recent Labs  08/26/12 1749  AMMONIA 20   CBC:  Recent Labs  08/26/12 1524  08/27/12 0505  WBC 7.2 7.7  NEUTROABS 4.5  --   HGB 14.2 14.1  HCT 42.5 42.5  MCV 91.4 91.6  PLT 132* 136*   Cardiac Enzymes:  Recent Labs  08/26/12 1524  TROPONINI <0.30   BNP:  Recent Labs  08/26/12 1524  PROBNP 10457.0*   D-Dimer: No results found for this basename: DDIMER,  in the last 72 hours CBG:  Recent Labs  08/26/12 2047 08/26/12 2144 08/26/12 2349 08/27/12 0419 08/27/12 0619 08/27/12 0742  GLUCAP 248* 225* 173* 89 120* 79   Hemoglobin A1C: No results found for this basename: HGBA1C,  in the last 72 hours Fasting Lipid Panel: No results found for this basename: CHOL, HDL, LDLCALC, TRIG, CHOLHDL, LDLDIRECT,  in the last 72 hours Thyroid Function Tests:  Recent Labs  08/26/12 1748  TSH 3.742   Anemia Panel: No results found for this basename: VITAMINB12, FOLATE, FERRITIN, TIBC, IRON, RETICCTPCT,  in the last 72 hours Coagulation:  Recent Labs  08/27/12 0505  LABPROT 14.6  INR 1.16   Urine Drug Screen: Drugs of Abuse  No results found for this basename: labopia, cocainscrnur, labbenz, amphetmu, thcu, labbarb    Alcohol Level: No results found for this basename: ETH,  in the last 72 hours Urinalysis:  Recent  Labs  08/26/12 1729  COLORURINE YELLOW  LABSPEC 1.011  PHURINE 5.5  GLUCOSEU NEGATIVE  HGBUR LARGE*  BILIRUBINUR NEGATIVE  KETONESUR NEGATIVE  PROTEINUR 100*  UROBILINOGEN 0.2  NITRITE NEGATIVE  LEUKOCYTESUR NEGATIVE   Misc. Labs:   Micro: No results found for this or any previous visit (from the past 240 hour(s)).  Studies/Results: Dg Chest 2 View  08/26/2012  *RADIOLOGY REPORT*  Clinical Data: Shortness of breath.  CHEST - 2 VIEW  Comparison: None.  Findings: Image quality is degraded by motion.  Heart is enlarged. Diffuse interstitial prominence and indistinctness.  Question mild coarsening.  Small bilateral pleural effusions.  IMPRESSION: Probable congestive heart failure.  Question underlying chronic lung disease.    Original Report Authenticated By: Leanna Battles, M.D.    Ct Head Wo Contrast  08/26/2012  *RADIOLOGY REPORT*  Clinical Data: Confusion.  Short of breath  CT HEAD WITHOUT CONTRAST  Technique:  Contiguous axial images were obtained from the base of the skull through the vertex without contrast.  Comparison: CT 02/15/2012  Findings: Prior left sided craniotomy for unknown procedure.  There is encephalomalacia in the left parietal cortex which is unchanged.  Generalized atrophy of a moderate degree.  Moderate chronic microvascular ischemia in the white matter.  Negative for acute infarct.  Negative for intracranial hemorrhage or mass.  Atherosclerotic calcification in the cavernous carotid bilaterally. Sinusitis with multiple air-fluid levels.  This was present on the prior study and shows mild interval improvement.  IMPRESSION: Atrophy and chronic ischemia.  No acute infarct or hemorrhage  Pansinusitis with multiple air-fluid levels, mild improvement since 02/15/2012.   Original Report Authenticated By: Janeece Riggers, M.D.    Dg Chest Portable 1 View  08/26/2012  *RADIOLOGY REPORT*  Clinical Data: Chest pain, shortness of breath  PORTABLE CHEST - 1 VIEW  Comparison: April 02, 2012.  Findings: Stable cardiomegaly.  Sternotomy wires are noted. Increased bilateral basilar opacities are noted consistent with atelectasis or edema with mild bilateral pleural effusions.  No pneumothorax is noted.  IMPRESSION: Cardiomegaly with bilateral basilar opacities concerning for atelectasis or edema with associated pleural effusions.  These findings are concerning for some degree of congestive heart failure.   Original Report Authenticated By: Lupita Raider.,  M.D.     Medications:  Scheduled: . apixaban  2.5 mg Oral BID  . atorvastatin  20 mg Oral Daily  . cloNIDine  0.1 mg Oral BID  . darifenacin  7.5 mg Oral Daily  . furosemide  40 mg Intravenous Q12H  . insulin aspart  0-9 Units Subcutaneous TID WC  .  levETIRAcetam  500 mg Oral BID  . oxybutynin  5 mg Oral Daily  . sodium chloride  3 mL Intravenous Q12H   Continuous:  PRN:     2-D echocardiogram 08/03/2012:  Study Conclusions  - Left ventricle: The cavity size was normal. Wall thickness was increased in a pattern of moderate LVH. Indeterminant diastolic function (atrial fibrillation). Systolic function was severely reduced. The estimated ejection fraction was in the range of 25% to 30%. Diffuse hypokinesis. - Aortic valve: There was no stenosis. Trivial regurgitation. - Mitral valve: Mildly calcified annulus. Mild regurgitation. - Left atrium: The atrium was moderately dilated. - Right ventricle: The cavity size was mildly dilated. Systolic function was moderately reduced. - Right atrium: The atrium was mildly dilated. - Tricuspid valve: Peak RV-RA gradient: 18mm Hg (S). - Pulmonary arteries: PA peak pressure: 28mm Hg (S). Impressions:  - The patient was in atrial fibrillation.  Normal LV size with moderate LV hypertrophy. EF 25-30% with diffuse hypokinesis. Mildly dilated RV with moderately decreased systolic function. Mild MR.    Assessment: Principal Problem:   Heart failure, acute systolic, first episode Active Problems:   DIABETES MELLITUS   HYPERCHOLESTEROLEMIA   OBESITY   OBSTRUCTIVE SLEEP APNEA   CORONARY ARTERY DISEASE   history of HEMORRHAGE, SUBDURAL   DIVERTICULOSIS, COLON   CAD (coronary artery disease)   Cardiomyopathy, ischemic   Obesity   HTN (hypertension), benign   Hyperlipidemia   Atrial fibrillation   Thrombocytopenia, unspecified   Acute renal failure   Hypoglycemia associated with diabetes   Bradycardia   1. Acute on chronic systolic congestive heart failure with a history of ischemic cardiomyopathy. His pro BNP was 10,457 on admission. He has diuresed almost 4 L since admission on IV Lasix. Recent echocardiogram revealed an ejection fraction of 25-30%. His TSH is within normal  limits He was on lisinopril chronically, but it is being held secondary to acute renal failure. Renal insufficiency may be allowed as the benefit of ACE inhibitor therapy may outweigh renal insufficiency. We'll consider beta blocker therapy, but his heart rate has been in the 40s to 50s.  Coronary artery disease. His initial troponin I. was negative. We'll continue to cycle cardiac enzymes.  Chronic atrial fibrillation. His rate is currently bradycardic. He is anticoagulated with apixaban.  Hypertension. Currently stable. We'll continue clonidine. Lisinopril is being held, but will consider restarting it.  Obstructive sleep apnea. The patient refuses to wear CPAP/BiPAP.  Acute renal failure. The patient's baseline creatinine is 1.5. It has increased slightly to 1.8. His renal function may improve with mobilization of fluid and increase renal perfusion.  Type 2 diabetes mellitus. He is treated chronically with Lantus. He had a hypoglycemic episode overnight which was treated accordingly. Lantus was discontinued. We'll continue sensitive scale sliding scale NovoLog for now.  Thrombocytopenia. Etiology unknown.   Mild confusion. He may have underlying dementia. His baseline mental status is unknown. His ABG on admission did not reveal hypercapnia. Vitamin B12 ordered and results pending.   Plan: 1. Will restart ACE inhibitor at lower dose and allow for renal insufficiency but will stop it if his creatinine increases above 2.0. 2. We'll hold beta blocker until heart rate is better. 3. Consult [place] cardiology. 4. For further evaluation, we'll order cardiac enzymes, hemoglobin A1c, and vitamin B12 level. 5. Monitor Blood glucose and adjust insulin accordingly. Treat for hypoglycemia.   LOS: 1 day   Taylor Jacobson 08/27/2012, 9:43 AM

## 2012-08-27 NOTE — Progress Notes (Signed)
Rhonda PA notified of troponin, no new orders. Will continue to monitor.

## 2012-08-27 NOTE — Progress Notes (Signed)
Patient ID: Taylor Jacobson, male   DOB: 06/08/1926, 79 y.o.   MRN: [id number] Patient came in for labs only

## 2012-08-28 DIAGNOSIS — I5021 Acute systolic (congestive) heart failure: Secondary | ICD-10-CM

## 2012-08-28 DIAGNOSIS — I498 Other specified cardiac arrhythmias: Secondary | ICD-10-CM

## 2012-08-28 DIAGNOSIS — N179 Acute kidney failure, unspecified: Secondary | ICD-10-CM

## 2012-08-28 LAB — GLUCOSE, CAPILLARY
Glucose-Capillary: 115 mg/dL — ABNORMAL HIGH (ref 70–99)
Glucose-Capillary: 132 mg/dL — ABNORMAL HIGH (ref 70–99)
Glucose-Capillary: 153 mg/dL — ABNORMAL HIGH (ref 70–99)
Glucose-Capillary: 120 mg/dL — ABNORMAL HIGH (ref 70–99)
Glucose-Capillary: 206 mg/dL — ABNORMAL HIGH (ref 70–99)
Glucose-Capillary: 163 mg/dL — ABNORMAL HIGH (ref 70–99)

## 2012-08-28 LAB — BASIC METABOLIC PANEL
CO2: 32 mEq/L (ref 19–32)
Calcium: 8.4 mg/dL (ref 8.4–10.5)
Chloride: 99 mEq/L (ref 96–112)
Glucose, Bld: 130 mg/dL — ABNORMAL HIGH (ref 70–99)
Potassium: 3.4 mEq/L — ABNORMAL LOW (ref 3.5–5.1)
Sodium: 142 mEq/L (ref 135–145)

## 2012-08-28 LAB — CBC
HCT: 42.5 % (ref 39.0–52.0)
Hemoglobin: 14 g/dL (ref 13.0–17.0)
MCH: 29.7 pg (ref 26.0–34.0)
MCV: 90 fL (ref 78.0–100.0)
Platelets: 143 10*3/uL — ABNORMAL LOW (ref 150–400)
RBC: 4.72 MIL/uL (ref 4.22–5.81)
WBC: 7 10*3/uL (ref 4.0–10.5)

## 2012-08-28 MED ORDER — POTASSIUM CHLORIDE CRYS ER 20 MEQ PO TBCR
20.0000 meq | EXTENDED_RELEASE_TABLET | Freq: Two times a day (BID) | ORAL | Status: DC
  Administered 2012-08-28 – 2012-08-30 (×5): 20 meq via ORAL
  Filled 2012-08-28 (×6): qty 1

## 2012-08-28 NOTE — Evaluation (Signed)
Physical Therapy Evaluation Patient Details Name: Taylor Jacobson MRN: [id number] DOB: 1926-06-18 Today's Date: 08/28/2012 Time: 7829-5621 PT Time Calculation (min): 20 min  PT Assessment / Plan / Recommendation Clinical Impression  Pt adm with CHF.  Needs skilled PT to maximize I and safety so pt can return home with family.      PT Assessment  Patient needs continued PT services    Follow Up Recommendations  No PT follow up    Does the patient have the potential to tolerate intense rehabilitation      Barriers to Discharge        Equipment Recommendations  None recommended by PT    Recommendations for Other Services     Frequency Min 3X/week    Precautions / Restrictions Precautions Precautions: Fall   Pertinent Vitals/Pain N/A      Mobility  Bed Mobility Bed Mobility: Supine to Sit;Sitting - Scoot to Edge of Bed Supine to Sit: 4: Min assist;HOB elevated Sitting - Scoot to [place] of Bed: 5: Supervision Transfers Transfers: Sit to Stand;Stand to Sit Sit to Stand: 4: Min assist;With upper extremity assist;From bed Stand to Sit: 4: Min assist;With upper extremity assist;With armrests;To bed;To chair/3-in-1 Details for Transfer Assistance: Assist to bring hips up. Ambulation/Gait Ambulation/Gait Assistance: 4: Min assist;4: Min Government social research officer (Feet): 225 Feet Assistive device: None;1 person hand held assist Ambulation/Gait Assistance Details: Pt initially unsteady and required definite hand held. As distance progressed pt became steadier and required less assist. Gait Pattern: Step-through pattern;Decreased step length - right;Decreased step length - left;Trunk flexed;Shuffle Gait velocity: decr    Exercises     PT Diagnosis: Difficulty walking;Generalized weakness  PT Problem List: Decreased strength;Decreased balance;Decreased mobility PT Treatment Interventions: DME instruction;Gait training;Patient/family education;Functional mobility  training;Therapeutic activities;Therapeutic exercise;Balance training   PT Goals Acute Rehab PT Goals PT Goal Formulation: With patient Time For Goal Achievement: 09/04/12 Potential to Achieve Goals: Good Pt will go Supine/Side to Sit: with supervision PT Goal: Supine/Side to Sit - Progress: Goal set today Pt will go Sit to Supine/Side: with supervision PT Goal: Sit to Supine/Side - Progress: Goal set today Pt will go Sit to Stand: with supervision PT Goal: Sit to Stand - Progress: Goal set today Pt will go Stand to Sit: with supervision PT Goal: Stand to Sit - Progress: Goal set today Pt will Ambulate: 51 - 150 feet;with least restrictive assistive device;with supervision PT Goal: Ambulate - Progress: Goal set today  Visit Information  Last PT Received On: 08/28/12 Assistance Needed: +1    Subjective Data  Subjective: Pt's family states he usually walks without assistive device. Patient Stated Goal: Return home   Prior Functioning  Home Living Lives With: Spouse Available Help at Discharge: Family Type of Home: House Home Access: Stairs to enter Secretary/administrator of Steps: 3 Entrance Stairs-Rails: None Home Layout: One level Bathroom Shower/Tub: Tub/shower unit Home Adaptive Equipment: Bedside commode/3-in-1;Shower chair with back;Walker - rolling;Straight cane Prior Function Level of Independence: Independent Able to Take Stairs?: Yes Vocation: Retired    Copywriter, advertising Arousal/Alertness: Awake/alert Behavior During Therapy: WFL for tasks assessed/performed Overall Cognitive Status: Within Functional Limits for tasks assessed    Extremity/Trunk Assessment Right Lower Extremity Assessment RLE ROM/Strength/Tone: Deficits RLE ROM/Strength/Tone Deficits: grossly 4/5 Left Lower Extremity Assessment LLE ROM/Strength/Tone: Deficits LLE ROM/Strength/Tone Deficits: grossly 4/5   Balance Balance Balance Assessed: Yes Static Standing Balance Static Standing  - Balance Support: No upper extremity supported Static Standing - Level of Assistance: 5: Stand by  assistance  End of Session PT - End of Session Equipment Utilized During Treatment: Gait belt Activity Tolerance: Patient tolerated treatment well Patient left: in chair;with call bell/phone within reach;with family/visitor present Nurse Communication: Mobility status  GP     Endrit Gittins 08/28/2012, 3:32 PM  [facility] PT [phone]

## 2012-08-28 NOTE — Progress Notes (Signed)
Responded to pt room to check with pt on status of wearing cpap tonight. Pt. Already wearing his cpap and is tolerating well. RN states pt. Has been wearing it all day.

## 2012-08-28 NOTE — Clinical Documentation Improvement (Signed)
CHANGE MENTAL STATUS DOCUMENTATION CLARIFICATION   THIS DOCUMENT IS NOT A PERMANENT PART OF THE MEDICAL RECORD  TO RESPOND TO THE THIS QUERY, FOLLOW THE INSTRUCTIONS BELOW:  1. If needed, update documentation for the patient's encounter via the notes activity.  2. Access this query again and click edit on the In [employer].  3. After updating, or not, click F2 to complete all highlighted (required) fields concerning your review. Select "additional documentation in the medical record" OR "no additional documentation provided".  4. Click Sign note button.  5. The deficiency will fall out of your In Basket *Please let us know if you are not able to complete this workflow by phone or e-mail (listed below).         08/28/12  Dear Dr. Sherrie Mustache Marton Redwood  In an effort to better capture your patient's severity of illness, reflect appropriate length of stay and utilization of resources, a review of the patient medical record has revealed the following indicators.    Based on your clinical judgment, please clarify and document in a progress note and/or discharge summary the clinical condition associated with the following supporting information:  In responding to this query please exercise your independent judgment.  The fact that a query is asked, does not imply that any particular answer is desired or expected.    Possible Clinical Conditions?  _______Encephalopathy (describe type if known)                       Anoxic                       Septic                       Alcoholic                        Hepatic                       Hypertensive                       Metabolic                       Toxic  _______Acute delirium _______Acute exacerbation of known dementia (indicate type) _______New diagnosis of Dementia, Alzheimer's, cerebral atherosclerosis _______Other Condition _______Cannot Clinically Determine     Supporting Information:  Risk Factors: AMS, hallucinations,  confusion noted per 4/30 progress notes. May have underlying dementia noted per 5/01 progress notes.    Reviewed:  no additional documentation provided  Thank You,  Marciano Sequin,  Clinical Documentation Specialist:  Phone: [phone]  Health Information Management [place]

## 2012-08-28 NOTE — Progress Notes (Signed)
Subjective: The patient sitting up in bed. He has no complaints of shortness of breath or chest pain at rest. No acute events reported overnight except for a mildly elevated troponin I reported yesterday afternoon.  Objective: Vital signs in last 24 hours: Filed Vitals:   08/27/12 2037 08/28/12 0054 08/28/12 0438 08/28/12 1034  BP: 125/87 122/64 119/75 109/89  Pulse: 65 70 58   Temp: 98.6 F (37 C)  97.7 F (36.5 C)   TempSrc: Oral  Oral   Resp: 18  18   Height:      Weight:      SpO2: 92% 91% 98%     Intake/Output Summary (Last 24 hours) at 08/28/12 1233 Last data filed at 08/28/12 0910  Gross per 24 hour  Intake    600 ml  Output   2880 ml  Net  -2280 ml    Weight change:   Physical exam: Morbidly obese 79 year old Caucasian man sitting up in bed, in no acute distress. Lungs: A few crackles auscultated in the mid lobes and in the lower lobes. Breathing is nonlabored. Heart: Distant irregular, irregular. Abdomen: Obese, positive bowel sounds, soft, mildly tender in the hypogastrium. No masses palpated. No appreciable distention.  Extremities: Trace of pedal edema bilaterally. Neurologic: He is alert. He has difficulty recalling events. He is aware that he is in the hospital. Cranial nerves II through XII appear to be grossly intact.  Lab Results: Basic Metabolic Panel:  Recent Labs  40/98/11 0505 08/28/12 0430  NA 145 142  K 4.1 3.4*  CL 103 99  CO2 33* 32  GLUCOSE 88 130*  BUN 29* 29*  CREATININE 1.79* 1.55*  CALCIUM 8.7 8.4   Liver Function Tests:  Recent Labs  08/26/12 1748 08/27/12 0505  AST 31 24  ALT 22 22  ALKPHOS 84 82  BILITOT 0.7 0.7  PROT 6.8 6.8  ALBUMIN 2.8* 2.8*   No results found for this basename: LIPASE, AMYLASE,  in the last 72 hours  Recent Labs  08/26/12 1749  AMMONIA 20   CBC:  Recent Labs  08/26/12 1411 08/26/12 1524 08/27/12 0505 08/28/12 0430  WBC 7.8 7.2 7.7 7.0  NEUTROABS 6.0 4.5  --   --   HGB 13.7 14.2  14.1 14.0  HCT 41.1 42.5 42.5 42.5  MCV 88.4 91.4 91.6 90.0  PLT 152 132* 136* 143*   Cardiac Enzymes:  Recent Labs  08/27/12 0920 08/27/12 1540 08/27/12 1958  TROPONINI <0.30 0.38* <0.30   BNP:  Recent Labs  08/26/12 1524 08/28/12 0430  PROBNP 10457.0* 4350.0*   D-Dimer: No results found for this basename: DDIMER,  in the last 72 hours CBG:  Recent Labs  08/27/12 2357 08/28/12 0142 08/28/12 0435 08/28/12 0606 08/28/12 0925 08/28/12 1144  GLUCAP 115* 115* 132* 125* 153* 131*   Hemoglobin A1C:  Recent Labs  08/27/12 1040  HGBA1C 6.7*   Fasting Lipid Panel: No results found for this basename: CHOL, HDL, LDLCALC, TRIG, CHOLHDL, LDLDIRECT,  in the last 72 hours Thyroid Function Tests:  Recent Labs  08/26/12 1748  TSH 3.742   Anemia Panel:  Recent Labs  08/27/12 0920  VITAMINB12 405   Coagulation:  Recent Labs  08/27/12 0505  LABPROT 14.6  INR 1.16   Urine Drug Screen: Drugs of Abuse  No results found for this basename: labopia,  cocainscrnur,  labbenz,  amphetmu,  thcu,  labbarb    Alcohol Level: No results found for this basename: ETH,  in the  last 72 hours Urinalysis:  Recent Labs  08/26/12 1729  COLORURINE YELLOW  LABSPEC 1.011  PHURINE 5.5  GLUCOSEU NEGATIVE  HGBUR LARGE*  BILIRUBINUR NEGATIVE  KETONESUR NEGATIVE  PROTEINUR 100*  UROBILINOGEN 0.2  NITRITE NEGATIVE  LEUKOCYTESUR NEGATIVE   Misc. Labs:   Micro: No results found for this or any previous visit (from the past 240 hour(s)).  Studies/Results: Dg Chest 2 View  08/26/2012  *RADIOLOGY REPORT*  Clinical Data: Shortness of breath.  CHEST - 2 VIEW  Comparison: None.  Findings: Image quality is degraded by motion.  Heart is enlarged. Diffuse interstitial prominence and indistinctness.  Question mild coarsening.  Small bilateral pleural effusions.  IMPRESSION: Probable congestive heart failure.  Question underlying chronic lung disease.   Original Report Authenticated  By: Leanna Battles, M.D.    Ct Head Wo Contrast  08/26/2012  *RADIOLOGY REPORT*  Clinical Data: Confusion.  Short of breath  CT HEAD WITHOUT CONTRAST  Technique:  Contiguous axial images were obtained from the base of the skull through the vertex without contrast.  Comparison: CT 02/15/2012  Findings: Prior left sided craniotomy for unknown procedure.  There is encephalomalacia in the left parietal cortex which is unchanged.  Generalized atrophy of a moderate degree.  Moderate chronic microvascular ischemia in the white matter.  Negative for acute infarct.  Negative for intracranial hemorrhage or mass.  Atherosclerotic calcification in the cavernous carotid bilaterally. Sinusitis with multiple air-fluid levels.  This was present on the prior study and shows mild interval improvement.  IMPRESSION: Atrophy and chronic ischemia.  No acute infarct or hemorrhage  Pansinusitis with multiple air-fluid levels, mild improvement since 02/15/2012.   Original Report Authenticated By: Janeece Riggers, M.D.    Dg Chest Portable 1 View  08/26/2012  *RADIOLOGY REPORT*  Clinical Data: Chest pain, shortness of breath  PORTABLE CHEST - 1 VIEW  Comparison: April 02, 2012.  Findings: Stable cardiomegaly.  Sternotomy wires are noted. Increased bilateral basilar opacities are noted consistent with atelectasis or edema with mild bilateral pleural effusions.  No pneumothorax is noted.  IMPRESSION: Cardiomegaly with bilateral basilar opacities concerning for atelectasis or edema with associated pleural effusions.  These findings are concerning for some degree of congestive heart failure.   Original Report Authenticated By: Lupita Raider.,  M.D.     Medications:  Scheduled: . apixaban  2.5 mg Oral BID  . atorvastatin  20 mg Oral Daily  . cloNIDine  0.1 mg Oral BID  . darifenacin  7.5 mg Oral Daily  . furosemide  40 mg Intravenous Q12H  . insulin aspart  0-9 Units Subcutaneous TID WC  . levETIRAcetam  500 mg Oral BID  .  lisinopril  5 mg Oral Daily  . oxybutynin  5 mg Oral Daily  . potassium chloride  20 mEq Oral BID  . sodium chloride  3 mL Intravenous Q12H   Continuous:  PRN:     2-D echocardiogram 08/03/2012:  Study Conclusions  - Left ventricle: The cavity size was normal. Wall thickness was increased in a pattern of moderate LVH. Indeterminant diastolic function (atrial fibrillation). Systolic function was severely reduced. The estimated ejection fraction was in the range of 25% to 30%. Diffuse hypokinesis. - Aortic valve: There was no stenosis. Trivial regurgitation. - Mitral valve: Mildly calcified annulus. Mild regurgitation. - Left atrium: The atrium was moderately dilated. - Right ventricle: The cavity size was mildly dilated. Systolic function was moderately reduced. - Right atrium: The atrium was mildly dilated. - Tricuspid valve: Peak  RV-RA gradient: 18mm Hg (S). - Pulmonary arteries: PA peak pressure: 28mm Hg (S). Impressions:  - The patient was in atrial fibrillation. Normal LV size with moderate LV hypertrophy. EF 25-30% with diffuse hypokinesis. Mildly dilated RV with moderately decreased systolic function. Mild MR.    Assessment: Principal Problem:   Heart failure, acute systolic, first episode Active Problems:   DIABETES MELLITUS   HYPERCHOLESTEROLEMIA   OBESITY   OBSTRUCTIVE SLEEP APNEA   CORONARY ARTERY DISEASE   history of HEMORRHAGE, SUBDURAL   DIVERTICULOSIS, COLON   CAD (coronary artery disease)   Cardiomyopathy, ischemic   Obesity   HTN (hypertension), benign   Hyperlipidemia   Atrial fibrillation   Thrombocytopenia, unspecified   Acute renal failure   Hypoglycemia associated with diabetes   Bradycardia   1. Acute on chronic systolic congestive heart failure with a history of ischemic cardiomyopathy. His pro BNP was 10,457 on admission; it is significantly lower today. He has diuresed almost 6.6 L since admission on IV Lasix. Recent echocardiogram  revealed an ejection fraction of 25-30%. His TSH is within normal limits. Lisinopril was restarted at a lower dose secondary to acute on chronic renal insufficiency. We'll titrate back up accordingly and follow renal function closely. We'll consider beta blocker therapy, but his heart rate has been in the 40s to 50s on admission and has remained mostly in the upper 50s lower 60s.   Coronary artery disease. 1 troponin I. increased modestly, but the followup troponin I has been within normal limits.  Chronic atrial fibrillation. His rate is currently bradycardic. He is anticoagulated with apixaban.  Hypertension. Currently stable, but low-normal We'll continue clonidine. Lisinopril was restarted at a lower dose.  Obstructive sleep apnea. The patient refuses to wear CPAP/BiPAP.  Acute renal failure. The patient's baseline creatinine is 1.5. It is back to baseline. It had increased slightly to 1.8. His renal function may improve with mobilization of fluid and increased renal perfusion.  Type 2 diabetes mellitus. He is treated chronically with Lantus. He had a hypoglycemic episode following admission which was treated accordingly. Lantus was discontinued. We'll continue sensitive scale sliding scale NovoLog for now. His hemoglobin A1c is 6.7.  Thrombocytopenia. Etiology unknown. It could be secondary to anticoagulant. His vitamin B12 level and TSH are within normal limits.  Mild confusion. It appears he has underlying dementia. His baseline mental status is unknown. His ABG on admission did not reveal hypercapnia. Vitamin B12 is within normal limits.   Plan: 1. Will ask cardiology to comment about whether or not beta blocker can be started. We'll continue IV Lasix and consider changing to oral Lasix in the next 24-48 hours. 2. Consider increasing lisinopril dosing tomorrow. 3. We'll order physical therapy.   LOS: 2 days   Micayla Brathwaite 08/28/2012, 12:33 PM

## 2012-08-28 NOTE — Care Management Note (Unsigned)
    Page 1 of 1   08/28/2012     3:38:30 PM   CARE MANAGEMENT NOTE 08/28/2012  Patient:  Taylor Jacobson, Taylor Jacobson   Account Number:  [id number]  Date Initiated:  08/28/2012  Documentation initiated by:  Sharlotte Baka  Subjective/Objective Assessment:   PT ADM ON 08/26/12 WITH CHF.  PTA, PT LIVES WITH WIFE AND DAUGHTER AT HOME.  HE IS ON CHRONIC OXYGEN THROUGH [place], AND OWNS RW, BSC, CANE AND TUB SEAT.     Action/Plan:   WILL FOLLOW FOR HOME NEEDS AS PT PROGRESSES.   Anticipated DC Date:  08/31/2012   Anticipated DC Plan:  HOME W HOME HEALTH SERVICES      DC Planning Services  CM consult      Choice offered to / List presented to:             Status of service:  In process, will continue to follow Medicare Important Message given?   (If response is "NO", the following Medicare IM given date fields will be blank) Date Medicare IM given:   Date Additional Medicare IM given:    Discharge Disposition:    Per UR Regulation:  Reviewed for med. necessity/level of care/duration of stay  If discussed at Long Length of Stay Meetings, dates discussed:    Comments:

## 2012-08-28 NOTE — Progress Notes (Signed)
Subjective: Patient breathing better  No CP Objective: Filed Vitals:   08/27/12 2037 08/28/12 0054 08/28/12 0438 08/28/12 1034  BP: 125/87 122/64 119/75 109/89  Pulse: 65 70 58   Temp: 98.6 F (37 C)  97.7 F (36.5 C)   TempSrc: Oral  Oral   Resp: 18  18   Height:      Weight:      SpO2: 92% 91% 98%    Weight change:   Intake/Output Summary (Last 24 hours) at 08/28/12 1205 Last data filed at 08/28/12 0910  Gross per 24 hour  Intake    600 ml  Output   2880 ml  Net  -2280 ml    General: Alert, awake, oriented x3, in no acute distress Neck:  JVP is increased Heart: Regular rate and rhythm, without murmurs, rubs, gallops.  Lungs: Clear to auscultation.  No rales or wheezes. Exemities:  No edema.   Neuro: Grossly intact, nonfocal.  Tele:  afib  Rates 70  Lab Results: Results for orders placed during the hospital encounter of 08/26/12 (from the past 24 hour(s))  GLUCOSE, CAPILLARY     Status: Abnormal   Collection Time    08/27/12  2:27 PM      Result Value Range   Glucose-Capillary 157 (*) 70 - 99 mg/dL   Comment 1 Documented in Chart     Comment 2 Notify RN    TROPONIN I     Status: Abnormal   Collection Time    08/27/12  3:40 PM      Result Value Range   Troponin I 0.38 (*) <0.30 ng/mL  GLUCOSE, CAPILLARY     Status: Abnormal   Collection Time    08/27/12  4:28 PM      Result Value Range   Glucose-Capillary 143 (*) 70 - 99 mg/dL   Comment 1 Documented in Chart     Comment 2 Notify RN    GLUCOSE, CAPILLARY     Status: Abnormal   Collection Time    08/27/12  6:30 PM      Result Value Range   Glucose-Capillary 167 (*) 70 - 99 mg/dL  TROPONIN I     Status: None   Collection Time    08/27/12  7:58 PM      Result Value Range   Troponin I <0.30  <0.30 ng/mL  GLUCOSE, CAPILLARY     Status: Abnormal   Collection Time    08/27/12  8:05 PM      Result Value Range   Glucose-Capillary 158 (*) 70 - 99 mg/dL  GLUCOSE, CAPILLARY     Status: Abnormal   Collection  Time    08/27/12 11:12 PM      Result Value Range   Glucose-Capillary 123 (*) 70 - 99 mg/dL  GLUCOSE, CAPILLARY     Status: Abnormal   Collection Time    08/27/12 11:57 PM      Result Value Range   Glucose-Capillary 115 (*) 70 - 99 mg/dL  GLUCOSE, CAPILLARY     Status: Abnormal   Collection Time    08/28/12  1:42 AM      Result Value Range   Glucose-Capillary 115 (*) 70 - 99 mg/dL  BASIC METABOLIC PANEL     Status: Abnormal   Collection Time    08/28/12  4:30 AM      Result Value Range   Sodium 142  135 - 145 mEq/L   Potassium 3.4 (*) 3.5 - 5.1 mEq/L  Chloride 99  96 - 112 mEq/L   CO2 32  19 - 32 mEq/L   Glucose, Bld 130 (*) 70 - 99 mg/dL   BUN 29 (*) 6 - 23 mg/dL   Creatinine, Ser 1.61 (*) 0.50 - 1.35 mg/dL   Calcium 8.4  8.4 - 09.6 mg/dL   GFR calc non Af Amer 39 (*) >90 mL/min   GFR calc Af Amer 46 (*) >90 mL/min  CBC     Status: Abnormal   Collection Time    08/28/12  4:30 AM      Result Value Range   WBC 7.0  4.0 - 10.5 K/uL   RBC 4.72  4.22 - 5.81 MIL/uL   Hemoglobin 14.0  13.0 - 17.0 g/dL   HCT 04.5  40.9 - 81.1 %   MCV 90.0  78.0 - 100.0 fL   MCH 29.7  26.0 - 34.0 pg   MCHC 32.9  30.0 - 36.0 g/dL   RDW 91.4  78.2 - 95.6 %   Platelets 143 (*) 150 - 400 K/uL  PRO B NATRIURETIC PEPTIDE     Status: Abnormal   Collection Time    08/28/12  4:30 AM      Result Value Range   Pro B Natriuretic peptide (BNP) 4350.0 (*) 0 - 450 pg/mL  GLUCOSE, CAPILLARY     Status: Abnormal   Collection Time    08/28/12  4:35 AM      Result Value Range   Glucose-Capillary 132 (*) 70 - 99 mg/dL  GLUCOSE, CAPILLARY     Status: Abnormal   Collection Time    08/28/12  6:06 AM      Result Value Range   Glucose-Capillary 125 (*) 70 - 99 mg/dL  GLUCOSE, CAPILLARY     Status: Abnormal   Collection Time    08/28/12  9:25 AM      Result Value Range   Glucose-Capillary 153 (*) 70 - 99 mg/dL  GLUCOSE, CAPILLARY     Status: Abnormal   Collection Time    08/28/12 11:44 AM      Result  Value Range   Glucose-Capillary 131 (*) 70 - 99 mg/dL    Studies/Results: @RISRSLT24 @  Medications: Reviewed   @PROBHOSP @  1.  CHF  Acute on chronic systolic CHF  Patient is diuresing  I would continue with IV lasix  Wean O2 as tolerated  2.  Renal  Patient Cr a little better than yesterday  FOllow closely.  3.  CAD  No symptoms of angina  4.  HTN  Follow.  5.  HL  Keep on lipitor  6.  Atrial fibrillation  Continue eliquis    LOS: 2 days   Dietrich Pates 08/28/2012, 12:05 PM

## 2012-08-28 NOTE — Progress Notes (Signed)
Patient evaluated for long-term disease management services with [facility] as a benefit of her [employer]. Spoke with patient initially at beside and he was a bit confused. Came back to bedside and spoke with patient, wife, and daughter, Verlon Au- POA. Explained [facility] Care Management services to daughter and wife and consents were signed. Confirmed best contact numbers. Plan is for patient to return home with daughter and wife. Likely will need home health services at discharge. Explained to family that home health will not interfere with home health services. Inpatient RNCM aware of meeting . Patient will receive a post discharge transition of care call and monthly home visits for assessments and for education.      Raiford Noble, MSN-Ed, RN,BSN, [facility], [phone]

## 2012-08-29 DIAGNOSIS — I10 Essential (primary) hypertension: Secondary | ICD-10-CM

## 2012-08-29 LAB — BASIC METABOLIC PANEL
BUN: 36 mg/dL — ABNORMAL HIGH (ref 6–23)
CO2: 33 mEq/L — ABNORMAL HIGH (ref 19–32)
Calcium: 8.8 mg/dL (ref 8.4–10.5)
Chloride: 99 mEq/L (ref 96–112)
Creatinine, Ser: 1.69 mg/dL — ABNORMAL HIGH (ref 0.50–1.35)
GFR calc non Af Amer: 35 mL/min — ABNORMAL LOW (ref >90)
Glucose, Bld: 131 mg/dL — ABNORMAL HIGH (ref 70–99)
Glucose, Bld: 176 mg/dL — ABNORMAL HIGH (ref 70–99)
Potassium: 4 mEq/L (ref 3.5–5.1)
Potassium: 4 mEq/L (ref 3.5–5.1)
Sodium: 141 mEq/L (ref 135–145)

## 2012-08-29 LAB — GLUCOSE, CAPILLARY: Glucose-Capillary: 157 mg/dL — ABNORMAL HIGH (ref 70–99)

## 2012-08-29 MED ORDER — FUROSEMIDE 40 MG PO TABS
60.0000 mg | ORAL_TABLET | Freq: Every day | ORAL | Status: DC
  Administered 2012-08-29 – 2012-08-30 (×2): 60 mg via ORAL
  Filled 2012-08-29 (×2): qty 1

## 2012-08-29 NOTE — Progress Notes (Signed)
Patient ambulated 600 feet with rolling walker and portable 02 (2 lpm) and standby assistance.  Patient tolerated well.  Patient sitting up in recliner talking with family members.  Will continue to monitor

## 2012-08-29 NOTE — Progress Notes (Signed)
Patient having intermittant bradycardia.  Heart rate between 37 and 62 and Afib.  Patient resting comfortably with CPAP on.  Patient denies chest pain.  RN will continue to monitor

## 2012-08-29 NOTE — Progress Notes (Signed)
[facility] PROGRESS NOTE  Taylor Jacobson JWJ:[id number] DOB: [birth year] DOA: 08/26/2012 PCP: Rudi Heap, MD  Assessment/Plan: Acute on chronic systolic congestive heart failure with a history of ischemic cardiomyopathy. His pro BNP was 10,457 on admission; it is significantly lower yesterday. He has diuresed almost 8.6 L since admission on IV Lasix. Recent echocardiogram revealed an ejection fraction of 25-30%. His TSH is within normal limits. Lisinopril was restarted at a lower dose secondary to acute on chronic renal insufficiency. We'll titrate back up accordingly and follow renal function closely. We'll consider beta blocker therapy, but his heart rate has been in the 40s to 50s on admission and has remained mostly in the upper 50s lower 60s.   Coronary artery disease. 1 troponin I. increased modestly, but the followup troponin I has been within normal limits.   Chronic atrial fibrillation. His rate is currently bradycardic. He is anticoagulated with apixaban.   Hypertension. Currently stable, but low-normal We'll continue clonidine. Lisinopril was restarted at a lower dose.   Obstructive sleep apnea. Encouraged patient to wear CPAP  Acute renal failure. The patient's baseline creatinine is 1.5. Creatinine appears close to baseline at this time.  Would continue current treatments  Type 2 diabetes mellitus. He is treated chronically with Lantus. He had a hypoglycemic episode following admission which was treated accordingly. Lantus was discontinued. We'll continue sensitive scale sliding scale NovoLog for now. His hemoglobin A1c is 6.7.  At the time of discharge, would send him on Lantus 10 units daily.  This can be titrated up in the outpatient setting.  Confusion.  Discussed with family and patient has been having confusion for over 1 month.  He is likely developing some degree of dementia and his confusion is exacerbated by being in the hospital.  This should hopefully improve  once patient has returned to his familiar environment.  Would follow up with primary care doctor.   Case discussed with Dr. Tenny Craw and she has kindly offered to assume care of this patient.  We will sign off at this point.  Please let us know if there are any questions of if we can be of any assistance.  Code Status: DNR Family Communication: discussed with daughter at the bedside Disposition Plan: probable discharge home tomorrow   Consultants:  [facility]  Procedures:  none  Antibiotics:  none (indicate start date, and stop date if known)  HPI/Subjective: Patient is confused today, denies any chest pain or shortness of breath  Objective: Filed Vitals:   08/28/12 1353 08/28/12 1909 08/28/12 1939 08/29/12 0500  BP:  129/61  126/78  Pulse: 59 64 80 68  Temp: 97 F (36.1 C) 98.1 F (36.7 C)  97.8 F (36.6 C)  TempSrc: Oral Oral  Oral  Resp: 17 24 18 28   Height:      Weight:    99.338 kg (219 lb)  SpO2: 93% 96%  93%    Intake/Output Summary (Last 24 hours) at 08/29/12 1133 Last data filed at 08/29/12 0900  Gross per 24 hour  Intake    480 ml  Output   2300 ml  Net  -1820 ml   Filed Weights   08/26/12 2026 08/27/12 0418 08/29/12 0500  Weight: 104.509 kg (230 lb 6.4 oz) 102.241 kg (225 lb 6.4 oz) 99.338 kg (219 lb)    Exam:   General:  NAD, confused  Cardiovascular: s1, s2, RRR  Respiratory: crackles at bases  Abdomen: soft, nt, nd, bs+  Musculoskeletal: trace edema b/l  Data Reviewed: Basic Metabolic Panel:  Recent Labs Lab 08/26/12 1411 08/26/12 1524 08/27/12 0505 08/28/12 0430 08/29/12 0548  NA 143 146* 145 142 141  K 4.1 4.2 4.1 3.4* 4.0  CL 107 109 103 99 99  CO2 28 29 33* 32 33*  GLUCOSE 112* 65* 88 130* 131*  BUN 31* 29* 29* 29* 35*  CREATININE 1.64* 1.73* 1.79* 1.55* 1.65*  CALCIUM 7.9* 8.6 8.7 8.4 8.7   Liver Function Tests:  Recent Labs Lab 08/26/12 1524 08/26/12 1748 08/27/12 0505  AST 29 31 24   ALT 24 22 22    ALKPHOS 86 84 82  BILITOT 0.5 0.7 0.7  PROT 6.8 6.8 6.8  ALBUMIN 2.8* 2.8* 2.8*   No results found for this basename: LIPASE, AMYLASE,  in the last 168 hours  Recent Labs Lab 08/26/12 1749  AMMONIA 20   CBC:  Recent Labs Lab 08/26/12 1411 08/26/12 1524 08/27/12 0505 08/28/12 0430  WBC 7.8 7.2 7.7 7.0  NEUTROABS 6.0 4.5  --   --   HGB 13.7 14.2 14.1 14.0  HCT 41.1 42.5 42.5 42.5  MCV 88.4 91.4 91.6 90.0  PLT 152 132* 136* 143*   Cardiac Enzymes:  Recent Labs Lab 08/26/12 1524 08/27/12 0920 08/27/12 1540 08/27/12 1958  TROPONINI <0.30 <0.30 0.38* <0.30   BNP (last 3 results)  Recent Labs  08/26/12 1524 08/28/12 0430  PROBNP 10457.0* 4350.0*   CBG:  Recent Labs Lab 08/28/12 1656 08/28/12 1906 08/28/12 2054 08/29/12 0613 08/29/12 1109  GLUCAP 120* 206* 163* 125* 193*    No results found for this or any previous visit (from the past 240 hour(s)).   Studies: No results found.  Scheduled Meds: . apixaban  2.5 mg Oral BID  . atorvastatin  20 mg Oral Daily  . cloNIDine  0.1 mg Oral BID  . darifenacin  7.5 mg Oral Daily  . furosemide  60 mg Oral Daily  . insulin aspart  0-9 Units Subcutaneous TID WC  . levETIRAcetam  500 mg Oral BID  . lisinopril  5 mg Oral Daily  . oxybutynin  5 mg Oral Daily  . potassium chloride  20 mEq Oral BID  . sodium chloride  3 mL Intravenous Q12H   Continuous Infusions:   Principal Problem:   Heart failure, acute systolic, first episode Active Problems:   DIABETES MELLITUS   HYPERCHOLESTEROLEMIA   OBESITY   OBSTRUCTIVE SLEEP APNEA   CORONARY ARTERY DISEASE   history of HEMORRHAGE, SUBDURAL   DIVERTICULOSIS, COLON   CAD (coronary artery disease)   Cardiomyopathy, ischemic   Obesity   HTN (hypertension), benign   Hyperlipidemia   Atrial fibrillation   Thrombocytopenia, unspecified   Acute renal failure   Hypoglycemia associated with diabetes   Bradycardia    Time spent:     [facility]  [facility] Pager [phone]. If 7PM-7AM, please contact night-coverage at [email or web address], password [facility] 08/29/2012, 11:33 AM  LOS: 3 days

## 2012-08-29 NOTE — Progress Notes (Signed)
Rt placed patient on his home bipap machine with 2lpm 02 bleed in patient is tolerating bipap well at this time.

## 2012-08-29 NOTE — Progress Notes (Signed)
Subjective: Breathing is OK  No CP  Daughter says he was confused last night. Objective: Filed Vitals:   08/28/12 1353 08/28/12 1909 08/28/12 1939 08/29/12 0500  BP:  129/61  126/78  Pulse: 59 64 80 68  Temp: 97 F (36.1 C) 98.1 F (36.7 C)  97.8 F (36.6 C)  TempSrc: Oral Oral  Oral  Resp: 17 24 18 28   Height:      Weight:    219 lb (99.338 kg)  SpO2: 93% 96%  93%   Weight change:   Intake/Output Summary (Last 24 hours) at 08/29/12 1028 Last data filed at 08/29/12 0900  Gross per 24 hour  Intake    480 ml  Output   2300 ml  Net  -1820 ml   Total I/O: 8400 cc negative  General: Alert, awake, oriented x3, in no acute distress Neck:  JVP is normal Heart: Regular rate and rhythm, without murmurs, rubs, gallops.  Lungs: Clear to auscultation.  No rales or wheezes. Exemities:  No edema.   Neuro: Grossly intact, nonfocal.  Tele:  AFib  70s Lab Results: Results for orders placed during the hospital encounter of 08/26/12 (from the past 24 hour(s))  GLUCOSE, CAPILLARY     Status: Abnormal   Collection Time    08/28/12 11:44 AM      Result Value Range   Glucose-Capillary 131 (*) 70 - 99 mg/dL  GLUCOSE, CAPILLARY     Status: Abnormal   Collection Time    08/28/12  2:37 PM      Result Value Range   Glucose-Capillary 128 (*) 70 - 99 mg/dL  GLUCOSE, CAPILLARY     Status: Abnormal   Collection Time    08/28/12  4:56 PM      Result Value Range   Glucose-Capillary 120 (*) 70 - 99 mg/dL  GLUCOSE, CAPILLARY     Status: Abnormal   Collection Time    08/28/12  7:06 PM      Result Value Range   Glucose-Capillary 206 (*) 70 - 99 mg/dL  GLUCOSE, CAPILLARY     Status: Abnormal   Collection Time    08/28/12  8:54 PM      Result Value Range   Glucose-Capillary 163 (*) 70 - 99 mg/dL  BASIC METABOLIC PANEL     Status: Abnormal   Collection Time    08/29/12  5:48 AM      Result Value Range   Sodium 141  135 - 145 mEq/L   Potassium 4.0  3.5 - 5.1 mEq/L   Chloride 99  96 - 112  mEq/L   CO2 33 (*) 19 - 32 mEq/L   Glucose, Bld 131 (*) 70 - 99 mg/dL   BUN 35 (*) 6 - 23 mg/dL   Creatinine, Ser 1.61 (*) 0.50 - 1.35 mg/dL   Calcium 8.7  8.4 - 09.6 mg/dL   GFR calc non Af Amer 37 (*) >90 mL/min   GFR calc Af Amer 42 (*) >90 mL/min  GLUCOSE, CAPILLARY     Status: Abnormal   Collection Time    08/29/12  6:13 AM      Result Value Range   Glucose-Capillary 125 (*) 70 - 99 mg/dL    Studies/Results: @RISRSLT24 @  Medications: Reviewed   @PROBHOSP @ 1.  Acute on chronic systolic CHF  Volume status is improved from yesterday  Would recheck BNP  Near baseline. Will give one more dose of IV lasix and switch to PO tomorrow.  Prob switch to  60 qd  2.  Renal  Cr slightly increased  3.  CAD  aysmptomatic  4.  HTN  Follow   5.  Afib  Rate control Continue eliquis  Probable d/c tomorrow with close outpatient f/u  LOS: 3 days   Dietrich Pates 08/29/2012, 10:28 AM

## 2012-08-30 LAB — GLUCOSE, CAPILLARY: Glucose-Capillary: 106 mg/dL — ABNORMAL HIGH (ref 70–99)

## 2012-08-30 MED ORDER — POTASSIUM CHLORIDE CRYS ER 20 MEQ PO TBCR: 30.0000 meq | EXTENDED_RELEASE_TABLET | Freq: Every day | ORAL | Status: DC

## 2012-08-30 MED ORDER — FUROSEMIDE 40 MG PO TABS: 60.0000 mg | ORAL_TABLET | Freq: Every day | ORAL | Status: DC

## 2012-08-30 MED ORDER — POTASSIUM CHLORIDE CRYS ER 20 MEQ PO TBCR: 20.0000 meq | EXTENDED_RELEASE_TABLET | Freq: Every day | ORAL | Status: DC

## 2012-08-30 MED ORDER — CLONIDINE HCL 0.1 MG PO TABS
0.1000 mg | ORAL_TABLET | Freq: Every day | ORAL | Status: DC
  Administered 2012-08-30: 0.1 mg via ORAL
  Filled 2012-08-30: qty 1

## 2012-08-30 MED ORDER — CLONIDINE HCL 0.1 MG PO TABS: 0.1000 mg | ORAL_TABLET | Freq: Every day | ORAL | Status: DC

## 2012-08-30 MED ORDER — LISINOPRIL 5 MG PO TABS: 5.0000 mg | ORAL_TABLET | Freq: Every day | ORAL | Status: DC

## 2012-08-30 NOTE — Progress Notes (Signed)
   CARE MANAGEMENT NOTE 08/30/2012  Patient:  Taylor Jacobson, Taylor Jacobson   Account Number:  [id number]  Date Initiated:  08/28/2012  Documentation initiated by:  AMERSON,JULIE  Subjective/Objective Assessment:   PT ADM ON 08/26/12 WITH CHF.  PTA, PT LIVES WITH WIFE AND DAUGHTER AT HOME.  HE IS ON CHRONIC OXYGEN THROUGH [facility], AND OWNS RW, BSC, CANE AND TUB SEAT.     Action/Plan:   WILL FOLLOW FOR HOME NEEDS AS PT PROGRESSES.   Anticipated DC Date:  08/31/2012   Anticipated DC Plan:  HOME W HOME HEALTH SERVICES      DC Planning Services  CM consult      [facility] Choice  HOME HEALTH   Choice offered to / List presented to:  C-4 Adult Children   DME arranged  OXYGEN      DME agency  [facility]     HH arranged  HH-1 RN  HH-10 DISEASE MANAGEMENT      HH agency  [facility]   Status of service:  Completed, signed off Medicare Important Message given?   (If response is "NO", the following Medicare IM given date fields will be blank) Date Medicare IM given:   Date Additional Medicare IM given:    Discharge Disposition:  HOME W HOME HEALTH SERVICES  Per UR Regulation:  Reviewed for med. necessity/level of care/duration of stay  If discussed at Long Length of Stay Meetings, dates discussed:    Comments:  08/30/2012 1500 NCM spoke to pt and he gave permission to speak to dtr, Verlon Au [phone]. States pt has oxygen with [facility] and he just wears at night. He does not have any portable tanks at home. Offered choice for [facility]. Dtr decided on [facility] for [facility]. Contacted [facility] with referral and faxed new orders to office. Faxed referral to [facility] for [facility] RN for scheduled dc today. Contact info added to dc instructions for [facility] and [facility].  Isidoro Donning RN CCM Case Mgmt phone [phone]

## 2012-08-30 NOTE — Discharge Summary (Signed)
CARDIOLOGY DISCHARGE SUMMARY    Patient ID: Taylor Jacobson,  MRN: [id number], DOB/AGE: [birth year] 79 y.o.  Admit date: 08/26/2012 Discharge date: 08/30/2012  Primary Care Physician: Rudi Heap, MD Primary Cardiologist: Antoine Poche, MD  Primary Discharge Diagnosis:  1. Acute on chronic systolic HF 2. Acute respiratory failure/hypoxia 3. Atrial fibrillation with SVR/bradycardia 4. Acute on chronic renal insufficiency 5. Probable dementia  Secondary Discharge Diagnoses:  1. Ischemic CM, EF 25% 2. CAD 3. DM 4. HTN 5. Dyslipidemia 6. History of SDH 7. OSA  Procedures This Admission:  None   History and Hospital Course:  Mr. Osborn is an 79 year old man with an ischemic CM, EF 25%, chronic systolic HF, CAD, AF, CKD, prior SDH and HTN who was admitted on 08/26/2012 with acute respiratory failure/hypoxia, secondary to acute on chronic systolic HF. He was diuresed using IV Lasix. Potassium supplementation was given. His lisinopril dose was decreased in setting of renal insufficiency. His serum Cr remained stable. No beta blocker was added due to bradycardia. He was continued on Eliquis for embolic prophylaxis given known AF. His troponin was minimally elevated, peak 0.38. This was not felt to represent ACS/acute plaque rupture. Decision regarding possible ischemic work-up, specifically stress Myoview, was deferred to his primary cardiologist, Dr. Antoine Poche, who he will see as an outpatient in 2 weeks. He has required [place] O2 continuously which will be provided to him at discharge. He remains hemodynamically stable. He has been seen, examined and deemed stable for DC to home today by Dr. Dietrich Pates.   Discharge Vitals: Blood pressure 126/97, pulse 75, temperature 97.9 F (36.6 C), temperature source Oral, resp. rate 18, height 5\' 9"  (1.753 m), weight 219 lb 6.4 oz (99.519 kg), SpO2 97.00%.   Labs: Lab Results  Component Value Date   WBC 7.0 08/28/2012   HGB 14.0 08/28/2012   HCT 42.5  08/28/2012   MCV 90.0 08/28/2012   PLT 143* 08/28/2012    Recent Labs Lab 08/27/12 0505  08/29/12 1118  NA 145  < > 139  K 4.1  < > 4.0  CL 103  < > 97  CO2 33*  < > 32  BUN 29*  < > 36*  CREATININE 1.79*  < > 1.69*  CALCIUM 8.7  < > 8.8  PROT 6.8  --   --   BILITOT 0.7  --   --   ALKPHOS 82  --   --   ALT 22  --   --   AST 24  --   --   GLUCOSE 88  < > 176*  < > = values in this interval not displayed. Lab Results  Component Value Date   TROPONINI <0.30 08/27/2012     Disposition:  The patient is being discharged in stable condition.  Follow-up:     Follow-up Information   Follow up with Rollene Rotunda, MD On 09/23/2012. (At 1:45 PM)    Contact information:   [facility] office [address] [place] [address] [phone]      Follow up with Rudi Heap, MD. Schedule an appointment as soon as possible for a visit in 1 week. (For hospital follow-up)    Contact information:   [address] [place] [address] [phone]      Discharge Medications:    Medication List    STOP taking these medications       meloxicam 15 MG tablet  Commonly known as:  MOBIC  TAKE these medications       amoxicillin 500 MG capsule  Commonly known as:  AMOXIL  Take 500 mg by mouth. Prn dental     apixaban 2.5 MG Tabs tablet  Commonly known as:  ELIQUIS  Take 1 tablet (2.5 mg total) by mouth 2 (two) times daily.     atorvastatin 20 MG tablet  Commonly known as:  LIPITOR  Take 1 tablet (20 mg total) by mouth daily.     cloNIDine 0.1 MG tablet  Commonly known as:  CATAPRES  Take 1 tablet (0.1 mg total) by mouth daily.     furosemide 40 MG tablet  Commonly known as:  LASIX  Take 1.5 tablets (60 mg total) by mouth daily.     insulin glargine 100 UNIT/ML injection  Commonly known as:  LANTUS  Inject 40 Units into the skin daily.     levETIRAcetam 500 MG tablet  Commonly known as:  KEPPRA  Take 500 mg by mouth 2 (two) times daily.       lisinopril 5 MG tablet  Commonly known as:  PRINIVIL,ZESTRIL  Take 1 tablet (5 mg total) by mouth daily.     NON FORMULARY  Vit d2  1 time weekly     oxybutynin 5 MG 24 hr tablet  Commonly known as:  DITROPAN-XL  Take 5 mg by mouth daily.     potassium chloride SA 20 MEQ tablet  Commonly known as:  K-DUR,KLOR-CON  Take 1 tablet (20 mEq total) by mouth daily.     solifenacin 5 MG tablet  Commonly known as:  VESICARE  Take 5 mg by mouth daily.       Duration of Discharge Encounter: Greater than 30 minutes including physician time.  Signed, Rick Duff, PA-C 08/30/2012, 2:06 PM

## 2012-08-30 NOTE — Progress Notes (Addendum)
Subjective: NO CP  Breathing is not at baseline.   Objective: Filed Vitals:   08/29/12 0500 08/29/12 1221 08/29/12 2145 08/30/12 0418  BP: 126/78 115/101 135/80 126/97  Pulse: 68 62 59 75  Temp: 97.8 F (36.6 C) 97.7 F (36.5 C) 97.8 F (36.6 C) 97.9 F (36.6 C)  TempSrc: Oral Oral Oral Oral  Resp: 28 18 18 18   Height:      Weight: 219 lb (99.338 kg)   219 lb 6.4 oz (99.519 kg)  SpO2: 93% 96% 97% 97%   Weight change: 6.4 oz (0.181 kg)  Intake/Output Summary (Last 24 hours) at 08/30/12 0736 Last data filed at 08/30/12 0414  Gross per 24 hour  Intake    960 ml  Output   3100 ml  Net  -2140 ml   Total I/O for admit:  -10.8L  General: Alert, awake, oriented x3, in no acute distress Neck:  JVP is normal Heart: Regular rate and rhythm, without murmurs, rubs, gallops.  Lungs: Clear to auscultation.  No rales or wheezes. Exemities:  No edema.   Neuro: Grossly intact, nonfocal.  Tele:  AFib  30s low to 70s. Lab Results: Results for orders placed during the hospital encounter of 08/26/12 (from the past 24 hour(s))  GLUCOSE, CAPILLARY     Status: Abnormal   Collection Time    08/29/12 11:09 AM      Result Value Range   Glucose-Capillary 193 (*) 70 - 99 mg/dL  BASIC METABOLIC PANEL     Status: Abnormal   Collection Time    08/29/12 11:18 AM      Result Value Range   Sodium 139  135 - 145 mEq/L   Potassium 4.0  3.5 - 5.1 mEq/L   Chloride 97  96 - 112 mEq/L   CO2 32  19 - 32 mEq/L   Glucose, Bld 176 (*) 70 - 99 mg/dL   BUN 36 (*) 6 - 23 mg/dL   Creatinine, Ser 1.61 (*) 0.50 - 1.35 mg/dL   Calcium 8.8  8.4 - 09.6 mg/dL   GFR calc non Af Amer 35 (*) >90 mL/min   GFR calc Af Amer 41 (*) >90 mL/min  PRO B NATRIURETIC PEPTIDE     Status: Abnormal   Collection Time    08/29/12 11:18 AM      Result Value Range   Pro B Natriuretic peptide (BNP) 3763.0 (*) 0 - 450 pg/mL  GLUCOSE, CAPILLARY     Status: Abnormal   Collection Time    08/29/12  4:04 PM      Result Value Range    Glucose-Capillary 157 (*) 70 - 99 mg/dL  GLUCOSE, CAPILLARY     Status: Abnormal   Collection Time    08/29/12  9:33 PM      Result Value Range   Glucose-Capillary 148 (*) 70 - 99 mg/dL  GLUCOSE, CAPILLARY     Status: Abnormal   Collection Time    08/30/12  6:16 AM      Result Value Range   Glucose-Capillary 137 (*) 70 - 99 mg/dL    Studies/Results: @RISRSLT24 @  Medications: Reviewed   @PROBHOSP @  1.  Acute on chronic systolic CHF.  Volume is signif improved from admit.  He may have a little extra fluid.  BUN and Cr have bumped a little.  BNP is increased but much better than on admit I think he is nearing baseline.  Difficult to push further given renal.  WIll switch to PO 60  Follow. He is still requiring O2 at 1L or he desats. I think he is able to go home today with O2.  Keep on current regimen Daily wts  Close outpatient follow up with primary MD and cardiology.  2.  CRI  Stable  3.  HTN  Adequate control  4.  Bradycardia.  On low dose clonidine  Follow   No signif pauses.  WIll switch to qd.  5  afib  Keep on eliquis.   LOS: 4 days   Dietrich Pates 08/30/2012, 7:36 AM

## 2012-08-31 ENCOUNTER — Telehealth: Payer: Self-pay | Admitting: Nurse Practitioner

## 2012-09-07 ENCOUNTER — Encounter: Payer: Self-pay | Admitting: Nurse Practitioner

## 2012-09-07 ENCOUNTER — Ambulatory Visit (INDEPENDENT_AMBULATORY_CARE_PROVIDER_SITE_OTHER): Payer: Medicare Other | Admitting: Nurse Practitioner

## 2012-09-07 VITALS — BP 104/58 | Temp 98.0°F | Wt 214.0 lb

## 2012-09-07 DIAGNOSIS — I509 Heart failure, unspecified: Secondary | ICD-10-CM

## 2012-09-07 MED ORDER — FUROSEMIDE 40 MG PO TABS: 40.0000 mg | ORAL_TABLET | Freq: Two times a day (BID) | ORAL | Status: DC

## 2012-09-07 NOTE — Progress Notes (Signed)
  Subjective:    Patient ID: Taylor Jacobson, male    DOB: 1927-04-26, 79 y.o.   MRN: [id number]  HPI Patient was seen by Dr. Monango Lions last week and he sent him over for chest x-ray. While here his O2 sta drooped to the 70's. Patient was sent to the hospital via EMS. He was a little confused prior to going to hospital. Was in hospital for 5 days. CHF and fluid around heart an dlungs. He is now on O2 at home all the time. Lasix was increased to a total of 60 mg a day. * Blood sugar was low while in the hospital some. Since home daughter has been afraid to give him his lantus- Blood sugars have been fluctuating.    Review of Systems  Constitutional: Negative.   HENT: Negative.   Respiratory: Negative for cough, shortness of breath and wheezing.   Cardiovascular: Negative.   Endocrine: Negative.   Genitourinary: Negative.   Musculoskeletal: Negative.   Hematological: Negative.   Psychiatric/Behavioral: Negative.        Objective:   Physical Exam  Constitutional: He is oriented to person, place, and time. He appears well-developed and well-nourished.  Cardiovascular: Normal rate, normal heart sounds and intact distal pulses.   Pulmonary/Chest: He has no wheezes. He has rales (faint crackles in bil bases).  Abdominal: Soft.  Neurological: He is alert and oriented to person, place, and time. He has normal reflexes.  Psychiatric: He has a normal mood and affect. His speech is normal. Thought content normal.  Slightly confused when asking questions   BP 104/58  Temp(Src) 98 F (36.7 C) (Oral)  Wt 214 lb (97.07 kg)  BMI 31.59 kg/m2         Assessment & Plan:  1. CHF (congestive heart failure) Increase Lasix to  Limit fluid content Daily weights- If increase> 2lb in 24 hours call - furosemide (LASIX) 40 MG tablet; Take 1 tablet (40 mg total) by mouth 2 (two) times daily.  Dispense: 60 tablet; Refill: 3  2. Diabetes Need to take at least 20 u of lantus a day keep diary of  blood sugars  Mary-Margaret Daphine Deutscher, FNP

## 2012-09-14 ENCOUNTER — Other Ambulatory Visit: Payer: Self-pay | Admitting: Nurse Practitioner

## 2012-09-23 ENCOUNTER — Ambulatory Visit (INDEPENDENT_AMBULATORY_CARE_PROVIDER_SITE_OTHER): Payer: Medicare Other | Admitting: Cardiology

## 2012-09-23 ENCOUNTER — Encounter: Payer: Self-pay | Admitting: Cardiology

## 2012-09-23 VITALS — BP 117/73 | HR 67 | Ht 65.0 in | Wt 217.0 lb

## 2012-09-23 DIAGNOSIS — I4891 Unspecified atrial fibrillation: Secondary | ICD-10-CM

## 2012-09-23 DIAGNOSIS — I2589 Other forms of chronic ischemic heart disease: Secondary | ICD-10-CM

## 2012-09-23 DIAGNOSIS — I10 Essential (primary) hypertension: Secondary | ICD-10-CM

## 2012-09-23 DIAGNOSIS — I255 Ischemic cardiomyopathy: Secondary | ICD-10-CM

## 2012-09-23 NOTE — Patient Instructions (Addendum)
The current medical regimen is effective;  continue present plan and medications.  Follow up in 2 months with Dr Hochrein. 

## 2012-09-23 NOTE — Progress Notes (Signed)
HPI The patient presents as a new patient for evaluation of coronary disease and atrial fibrillation.In 2009 he had a subdural hematoma and wasn't expected to live according to his family. Since he has survived they reestablished with cardiology recently. He has a history of coronary disease with CABG. He has an ischemic cardiomyopathy with an EF of 35% on cath in 2002.   Since our first visit in March I did start him on systemic anticoagulation given his high risk for stroke. I also did an echocardiogram and noted that his ejection fraction is now about 25%.  At the last visit he was acutely ill. After getting a chest x-ray and being found to be in pulmonary edema he was admitted. He was treated with IV Lasix. He did have a slightly elevated troponin. However, further workup was deferred. He was discharged on oxygen.  Since going home he has not. He has had no acute shortness of breath, PND or orthopnea. He has had no new chest discomfort. He's not reporting any presyncope or syncope.  No Known Allergies  Current Outpatient Prescriptions  Medication Sig Dispense Refill  . amoxicillin (AMOXIL) 500 MG capsule Take 500 mg by mouth. Prn dental      . apixaban (ELIQUIS) 2.5 MG TABS tablet Take 1 tablet (2.5 mg total) by mouth 2 (two) times daily.  60 tablet  6  . atorvastatin (LIPITOR) 20 MG tablet Take 1 tablet (20 mg total) by mouth daily.  30 tablet  3  . B-D INS SYR ULTRAFINE 1CC/31G 31G X 5/16" 1 ML MISC 40 UNITS EVERY DAY  10 each  1  . cloNIDine (CATAPRES) 0.1 MG tablet Take 1 tablet (0.1 mg total) by mouth daily.  30 tablet  3  . furosemide (LASIX) 40 MG tablet Take 1 tablet (40 mg total) by mouth 2 (two) times daily.  60 tablet  3  . insulin glargine (LANTUS) 100 UNIT/ML injection Inject 40 Units into the skin daily.      Marland Kitchen levETIRAcetam (KEPPRA) 500 MG tablet Take 500 mg by mouth 2 (two) times daily.      Marland Kitchen lisinopril (PRINIVIL,ZESTRIL) 5 MG tablet Take 1 tablet (5 mg total) by mouth  daily.  30 tablet  3  . NON FORMULARY Vit d2 1 time weekly      . oxybutynin (DITROPAN-XL) 5 MG 24 hr tablet Take 5 mg by mouth daily.      . potassium chloride SA (K-DUR,KLOR-CON) 20 MEQ tablet Take 1 tablet (20 mEq total) by mouth daily.  30 tablet  3  . solifenacin (VESICARE) 5 MG tablet Take 5 mg by mouth daily.       No current facility-administered medications for this visit.    Past Medical History  Diagnosis Date  . CAD (coronary artery disease)     a. 1997 s/p CABG x 4: LIMA->LAD, VG->Diag, VG->OM, VG->dRCA;  b. 07/2000 Cath: LM nl, LAD 64m, RI 99p, LCX 100p, RCA 90, VG->RCA nl, VG->OM nl, VG->Diag nl, LIMA->LAD nl.  . Diabetes   . Chronic systolic CHF (congestive heart failure)     a. 07/2012 Echo: EF 25-30%, diff HK, triv AI, mild MR, mod dil LA, mod reduced RV syst fxn, mildly dil RA.  . Cardiomyopathy, ischemic   . Obesity   . HTN (hypertension), benign   . Hyperlipidemia   . Subdural hematoma     a. 2009.  Marland Kitchen OSA (obstructive sleep apnea)     a. uses CPAP  . Atrial  fibrillation     a. Eliquis started 06/2012.    Past Surgical History  Procedure Laterality Date  . Coronary artery bypass graft      1997 L - LAD, SVG D1, SVG OM, SVG RCA.  Grafts patent on cath 2002  . Hemorrhoid surgery    . Cholecystectomy    . Bilateral total knee replacements    . Nasal septum surgery       ROS:  Positive for cough.  Otherwise as stated in the HPI and negative for all other systems.  PHYSICAL EXAM BP 117/73  Pulse 67  Ht 5\' 5"  (1.651 m)  Wt 217 lb (98.431 kg)  BMI 36.11 kg/m2 GENERAL:  Frail appearing, but no distress HEENT:  Pupils equal round and reactive, fundi not visualized, oral mucosa unremarkable NECK:  No jugular venous distention, waveform within normal limits, carotid upstroke brisk and symmetric, no bruits, no thyromegaly LYMPHATICS:  No cervical, inguinal adenopathy LUNGS:  Clear to auscultation bilaterally BACK:  No CVA tenderness CHEST:   Unremarkable HEART:  PMI not displaced or sustained,S1 and S2 within normal limits, no S3,  no clicks, no rubs, no murmurs, irreugular ABD:  Flat, positive bowel sounds normal in frequency in pitch, no bruits, no rebound, no guarding, no midline pulsatile mass, no hepatomegaly, no splenomegaly EXT:  2 plus pulses throughout, no edema, no cyanosis no clubbing, dependent rubor SKIN:  No rashes no nodules NEURO:  Cranial nerves II through XII grossly intact, motor grossly intact throughout PSYCH:  Cognitively intact, oriented to person place and time, more alert today  ASSESSMENT AND PLAN  ISCHEMIC CARDIOMYOPATHY:   He seems to be euvolemic today.I reviewed the goals of care with his family. When he goes back to see Rudi Heap, MD He should get blood work to check his basic metabolic profile. However, today I will suggest no change in the therapy.  ATRIAL FIBRILLATION:  The patient is unaware of this rhythm. He has no contraindication to anticoagulation.  Taylor Jacobson has a CHA2DS2 - VASc score of 6 with a risk of stroke of 9.8%  and a HAS - BLED score of 2 with a low bleeding risk.  He will remain on the Eliquis.  I will check a CBC today.  CAD:  He has no acute symptoms. He will continue with risk reduction.  Despite the elevated troponin at the last hospitalization I don't think further testing is indicated.

## 2012-09-28 ENCOUNTER — Telehealth: Payer: Self-pay | Admitting: Nurse Practitioner

## 2012-09-28 NOTE — Telephone Encounter (Signed)
appt made

## 2012-10-04 ENCOUNTER — Emergency Department (HOSPITAL_COMMUNITY)
Admission: EM | Admit: 2012-10-04 | Discharge: 2012-10-04 | Disposition: A | Discharge status: Home / Self Care | Payer: Medicare Other | Attending: Emergency Medicine | Admitting: Emergency Medicine

## 2012-10-04 ENCOUNTER — Emergency Department (HOSPITAL_COMMUNITY): Payer: Medicare Other

## 2012-10-04 ENCOUNTER — Encounter (HOSPITAL_COMMUNITY): Payer: Self-pay

## 2012-10-04 DIAGNOSIS — S0100XA Unspecified open wound of scalp, initial encounter: Secondary | ICD-10-CM | POA: Insufficient documentation

## 2012-10-04 DIAGNOSIS — E785 Hyperlipidemia, unspecified: Secondary | ICD-10-CM | POA: Insufficient documentation

## 2012-10-04 DIAGNOSIS — Z87891 Personal history of nicotine dependence: Secondary | ICD-10-CM | POA: Insufficient documentation

## 2012-10-04 DIAGNOSIS — I2589 Other forms of chronic ischemic heart disease: Secondary | ICD-10-CM | POA: Insufficient documentation

## 2012-10-04 DIAGNOSIS — E119 Type 2 diabetes mellitus without complications: Secondary | ICD-10-CM | POA: Insufficient documentation

## 2012-10-04 DIAGNOSIS — W1809XA Striking against other object with subsequent fall, initial encounter: Secondary | ICD-10-CM | POA: Insufficient documentation

## 2012-10-04 DIAGNOSIS — I2510 Atherosclerotic heart disease of native coronary artery without angina pectoris: Secondary | ICD-10-CM | POA: Insufficient documentation

## 2012-10-04 DIAGNOSIS — I4891 Unspecified atrial fibrillation: Secondary | ICD-10-CM | POA: Insufficient documentation

## 2012-10-04 DIAGNOSIS — S0191XA Laceration without foreign body of unspecified part of head, initial encounter: Secondary | ICD-10-CM

## 2012-10-04 DIAGNOSIS — E669 Obesity, unspecified: Secondary | ICD-10-CM | POA: Insufficient documentation

## 2012-10-04 DIAGNOSIS — I509 Heart failure, unspecified: Secondary | ICD-10-CM | POA: Insufficient documentation

## 2012-10-04 DIAGNOSIS — I10 Essential (primary) hypertension: Secondary | ICD-10-CM | POA: Insufficient documentation

## 2012-10-04 DIAGNOSIS — Z79899 Other long term (current) drug therapy: Secondary | ICD-10-CM | POA: Insufficient documentation

## 2012-10-04 DIAGNOSIS — Z794 Long term (current) use of insulin: Secondary | ICD-10-CM | POA: Insufficient documentation

## 2012-10-04 DIAGNOSIS — G4733 Obstructive sleep apnea (adult) (pediatric): Secondary | ICD-10-CM | POA: Insufficient documentation

## 2012-10-04 DIAGNOSIS — Y9389 Activity, other specified: Secondary | ICD-10-CM | POA: Insufficient documentation

## 2012-10-04 DIAGNOSIS — Y929 Unspecified place or not applicable: Secondary | ICD-10-CM | POA: Insufficient documentation

## 2012-10-04 DIAGNOSIS — Z951 Presence of aortocoronary bypass graft: Secondary | ICD-10-CM | POA: Insufficient documentation

## 2012-10-04 NOTE — ED Provider Notes (Signed)
Medical screening examination/treatment/procedure(s) were conducted as a shared visit with non-physician practitioner(s) and myself.  I personally evaluated the patient during the encounter  Pt had a mechanical fall.  Laceration on his scalp repaired.  Pt ready for discharge.  CT scans were without acute findings.  Celene Kras, MD 10/04/12 [phone]

## 2012-10-04 NOTE — ED Provider Notes (Signed)
History     CSN: [id number]  Arrival date & time 10/04/12  1753   First MD Initiated Contact with Patient 10/04/12 1753      Chief Complaint  Patient presents with  . Head Laceration    (Consider location/radiation/quality/duration/timing/severity/associated sxs/prior treatment) HPI  Patient presents to the ED with complaints of head laceration after a fall from a half foot step up. He was in his utility room and attempting to work on the water heater when he fell back landing on the dog bed but hitting his head on unknown object. He did not lose consciousness he has not had any vomiting is not altered. The patient denies any pain aside from the cut on the top of his head in which the bleeding is currently controlled. He has a past medical history positive for diabetes, heart failure hypertension, atrial fibrillation, subdural hematoma in 2009 requiring surgery, ischemic cardiomyopathy, hyperlipidemia. The patient is awake and alert yet heart of hearing. Patient has been offered pain medication and declines any medication for pain at this time.   Past Medical History  Diagnosis Date  . CAD (coronary artery disease)     a. 1997 s/p CABG x 4: LIMA->LAD, VG->Diag, VG->OM, VG->dRCA;  b. 07/2000 Cath: LM nl, LAD 71m, RI 99p, LCX 100p, RCA 90, VG->RCA nl, VG->OM nl, VG->Diag nl, LIMA->LAD nl.  . Diabetes   . Chronic systolic CHF (congestive heart failure)     a. 07/2012 Echo: EF 25-30%, diff HK, triv AI, mild MR, mod dil LA, mod reduced RV syst fxn, mildly dil RA.  . Cardiomyopathy, ischemic   . Obesity   . HTN (hypertension), benign   . Hyperlipidemia   . Subdural hematoma     a. 2009.  Marland Kitchen OSA (obstructive sleep apnea)     a. uses CPAP  . Atrial fibrillation     a. Eliquis started 06/2012.    Past Surgical History  Procedure Laterality Date  . Coronary artery bypass graft      1997 L - LAD, SVG D1, SVG OM, SVG RCA.  Grafts patent on cath 2002  . Hemorrhoid surgery    .  Cholecystectomy    . Bilateral total knee replacements    . Nasal septum surgery      Family History  Problem Relation Age of Onset  . Other Mother     died during childbirth.  . Stroke Father     History  Substance Use Topics  . Smoking status: Former Smoker    Types: Cigarettes  . Smokeless tobacco: Not on file     Comment: Distant past  . Alcohol Use: No      Review of Systems  All other systems reviewed and are negative.    Allergies  Review of patient's allergies indicates no known allergies.  Home Medications   Current Outpatient Rx  Name  Route  Sig  Dispense  Refill  . apixaban (ELIQUIS) 2.5 MG TABS tablet   Oral   Take 2.5 mg by mouth 2 (two) times daily.         Marland Kitchen atorvastatin (LIPITOR) 20 MG tablet   Oral   Take 1 tablet (20 mg total) by mouth daily.   30 tablet   3   . B-D INS SYR ULTRAFINE 1CC/31G 31G X 5/16" 1 ML MISC      40 UNITS EVERY DAY   10 each   1   . cloNIDine (CATAPRES) 0.1 MG tablet   Oral  Take 1 tablet (0.1 mg total) by mouth daily.   30 tablet   3   . ergocalciferol (VITAMIN D2) 50000 UNITS capsule   Oral   Take 50,000 Units by mouth once a week. No specific day of the week         . furosemide (LASIX) 40 MG tablet   Oral   Take 1 tablet (40 mg total) by mouth 2 (two) times daily.   60 tablet   3     Needs to be seen before next refill   . insulin glargine (LANTUS) 100 UNIT/ML injection   Subcutaneous   Inject 40 Units into the skin at bedtime.          . levETIRAcetam (KEPPRA) 500 MG tablet   Oral   Take 500 mg by mouth 2 (two) times daily.         Marland Kitchen lisinopril (PRINIVIL,ZESTRIL) 5 MG tablet   Oral   Take 1 tablet (5 mg total) by mouth daily.   30 tablet   3   . oxybutynin (DITROPAN-XL) 5 MG 24 hr tablet   Oral   Take 5 mg by mouth daily.         . potassium chloride SA (K-DUR,KLOR-CON) 20 MEQ tablet   Oral   Take 1 tablet (20 mEq total) by mouth daily.   30 tablet   3   . solifenacin  (VESICARE) 5 MG tablet   Oral   Take 5 mg by mouth daily.           BP 123/75  Pulse 73  Temp(Src) 97.2 F (36.2 C) (Oral)  Resp 20  SpO2 99%  Physical Exam  Nursing note and vitals reviewed. Constitutional: He appears well-developed and well-nourished. No distress.  HENT:  Head: Normocephalic. Head is with contusion and with laceration. Head is without raccoon's eyes, without Battle's sign, without abrasion, without right periorbital erythema and without left periorbital erythema. Hair is normal.    Eyes: Pupils are equal, round, and reactive to light.  Neck: Normal range of motion. Neck supple.  Cardiovascular: Normal rate and regular rhythm.   Pulmonary/Chest: Effort normal.  Abdominal: Soft.  Neurological: He is alert.  Skin: Skin is warm and dry.    ED Course  Procedures (including critical care time)  Labs Reviewed - No data to display Ct Head Wo Contrast  10/04/2012   *RADIOLOGY REPORT*  Clinical Data:  Head laceration.  Fall.  CT HEAD WITHOUT CONTRAST CT CERVICAL SPINE WITHOUT CONTRAST  Technique:  Multidetector CT imaging of the head and cervical spine was performed following the standard protocol without intravenous contrast.  Multiplanar CT image reconstructions of the cervical spine were also generated.  Comparison:  08/26/2012.  CT HEAD  Findings: Laceration in the midline of the frontal scalp. Unchanged left frontal craniotomy.  Severe atherosclerosis is present. No mass lesion, mass effect, midline shift, hydrocephalus, hemorrhage.  No acute territorial cortical ischemia/infarct. Atrophy and chronic ischemic white matter disease is present. Anterior left temporal lobe encephalomalacia, probably post- traumatic.  Chronic sinus disease with probable fluid level in the left maxillary sinus which can be associated with acute sinusitis. Left parietal encephalomalacia.  IMPRESSION: No interval change.  Old encephalomalacia in the left temporal lobe.  Postoperative changes  of left craniectomy.  Atrophy and chronic ischemic white matter disease.  No acute abnormality.  CT CERVICAL SPINE  Findings: Severe multilevel cervical spondylosis is present.  There is no acute fracture or dislocation identified.  Craniocervical junction  appears normal.  C2 compression fractures present with 25% loss of anterior vertebral body height, probably chronic but age indeterminant.  1 mm anterolisthesis of C2 on C3 is probably degenerative in associated with the severe facet arthrosis. Multilevel foraminal narrowing associated with uncovertebral spurring and facet arthrosis.  Mild multilevel bony central stenosis.  Incidental visualization of the lung apices demonstrate probable pulmonary parenchymal scarring, sternoclavicular osteoarthritis and median sternotomy.  Partially visualized pacemaker leads.  Carotid atherosclerosis.  Thoracic spine DISH is present.  IMPRESSION: Severe cervical spondylosis without acute abnormality.   Original Report Authenticated By: Andreas Newport, M.D.   Ct Cervical Spine Wo Contrast  10/04/2012   *RADIOLOGY REPORT*  Clinical Data:  Head laceration.  Fall.  CT HEAD WITHOUT CONTRAST CT CERVICAL SPINE WITHOUT CONTRAST  Technique:  Multidetector CT imaging of the head and cervical spine was performed following the standard protocol without intravenous contrast.  Multiplanar CT image reconstructions of the cervical spine were also generated.  Comparison:  08/26/2012.  CT HEAD  Findings: Laceration in the midline of the frontal scalp. Unchanged left frontal craniotomy.  Severe atherosclerosis is present. No mass lesion, mass effect, midline shift, hydrocephalus, hemorrhage.  No acute territorial cortical ischemia/infarct. Atrophy and chronic ischemic white matter disease is present. Anterior left temporal lobe encephalomalacia, probably post- traumatic.  Chronic sinus disease with probable fluid level in the left maxillary sinus which can be associated with acute sinusitis.  Left parietal encephalomalacia.  IMPRESSION: No interval change.  Old encephalomalacia in the left temporal lobe.  Postoperative changes of left craniectomy.  Atrophy and chronic ischemic white matter disease.  No acute abnormality.  CT CERVICAL SPINE  Findings: Severe multilevel cervical spondylosis is present.  There is no acute fracture or dislocation identified.  Craniocervical junction appears normal.  C2 compression fractures present with 25% loss of anterior vertebral body height, probably chronic but age indeterminant.  1 mm anterolisthesis of C2 on C3 is probably degenerative in associated with the severe facet arthrosis. Multilevel foraminal narrowing associated with uncovertebral spurring and facet arthrosis.  Mild multilevel bony central stenosis.  Incidental visualization of the lung apices demonstrate probable pulmonary parenchymal scarring, sternoclavicular osteoarthritis and median sternotomy.  Partially visualized pacemaker leads.  Carotid atherosclerosis.  Thoracic spine DISH is present.  IMPRESSION: Severe cervical spondylosis without acute abnormality.   Original Report Authenticated By: Andreas Newport, M.D.     1. Laceration of head, initial encounter       MDM  LACERATION REPAIR Performed by: Dorthula Matas Authorized by: Dorthula Matas Consent: Verbal consent obtained. Risks and benefits: risks, benefits and alternatives were discussed Consent given by: patient Patient identity confirmed: provided demographic data Prepped and Draped in normal sterile fashion Wound explored  Laceration Location: scalp laceration  Laceration Length: 4 cm  No Foreign Bodies seen or palpated  Anesthesia: local infiltration  Local anesthetic: lidocaine 3 % with epinephrine  Anesthetic total: 3 ml  Irrigation method: syringe Amount of cleaning: standard  Skin closure: staples  Number of sutures: 5  Technique: staple  Patient tolerance: Patient tolerated the procedure well  with no immediate complications.  Pt tolerated procedure well images are benign for acute changes. Blood work was ordered but had no been drawn after normal images resulted. I canceled labs and had Dr. Quillian Quince say hello to patient. Family here to take him home.  79 y.o.Taylor Jacobson's evaluation in the Emergency Department is complete. It has been determined that no acute conditions requiring further emergency intervention are present  at this time. The patient/guardian have been advised of the diagnosis and plan. We have discussed signs and symptoms that warrant return to the ED, such as changes or worsening in symptoms.  Vital signs are stable at discharge. Filed Vitals:   10/04/12 1759  BP: 123/75  Pulse: 73  Temp: 97.2 F (36.2 C)  Resp: 20    Patient/guardian has voiced understanding and agreed to follow-up with the PCP or specialist.        Dorthula Matas, PA-C 10/04/12 1925

## 2012-10-04 NOTE — ED Notes (Signed)
Pt. Fell/tripped over a step and fell into a utility room. Laceration to the top of head.  Pt. Denies any neck pain or head denies any LOC. Pt. Is alert and oriented X4. Dressing intact with bleeding controlled.

## 2012-10-05 ENCOUNTER — Ambulatory Visit (INDEPENDENT_AMBULATORY_CARE_PROVIDER_SITE_OTHER): Payer: Medicare Other | Admitting: Nurse Practitioner

## 2012-10-05 ENCOUNTER — Encounter: Payer: Self-pay | Admitting: Nurse Practitioner

## 2012-10-05 VITALS — BP 116/73 | HR 53 | Temp 97.2°F | Ht 65.0 in | Wt 218.0 lb

## 2012-10-05 DIAGNOSIS — E119 Type 2 diabetes mellitus without complications: Secondary | ICD-10-CM

## 2012-10-05 MED ORDER — OXYBUTYNIN CHLORIDE ER 5 MG PO TB24: 5.0000 mg | ORAL_TABLET | Freq: Every day | ORAL | Status: DC

## 2012-10-05 NOTE — Patient Instructions (Signed)
1800 Calorie Diet for Diabetes Meal Planning The 1800 calorie diet is designed for eating up to 1800 calories each day. Following this diet and making healthy meal choices can help improve overall health. This diet controls blood sugar (glucose) levels and can also help lower blood pressure and cholesterol. SERVING SIZES Measuring foods and serving sizes helps to make sure you are getting the right amount of food. The list below tells how big or small some common serving sizes are:  1 oz.........4 stacked dice.  3 oz.........Deck of cards.  1 tsp........Tip of little finger.  1 tbs........Thumb.  2 tbs........Golf ball.   cup.......Half of a fist.  1 cup........A fist. GUIDELINES FOR CHOOSING FOODS The goal of this diet is to eat a variety of foods and limit calories to 1800 each day. This can be done by choosing foods that are low in calories and fat. The diet also suggests eating small amounts of food frequently. Doing this helps control your blood glucose levels so they do not get too high or too low. Each meal or snack may include a protein food source to help you feel more satisfied and to stabilize your blood glucose. Try to eat about the same amount of food around the same time each day. This includes weekend days, travel days, and days off work. Space your meals about 4 to 5 hours apart and add a snack between them if you wish.  For example, a daily food plan could include breakfast, a morning snack, lunch, dinner, and an evening snack. Healthy meals and snacks include whole grains, vegetables, fruits, lean meats, poultry, fish, and dairy products. As you plan your meals, select a variety of foods. Choose from the bread and starch, vegetable, fruit, dairy, and meat/protein groups. Examples of foods from each group and their suggested serving sizes are listed below. Use measuring cups and spoons to become familiar with what a healthy portion looks like. Bread and Starch Each serving  equals 15 grams of carbohydrates.  1 slice bread.   bagel.   cup cold cereal (unsweetened).   cup hot cereal or mashed potatoes.  1 small potato (size of a computer mouse).   cup cooked pasta or rice.   English muffin.  1 cup broth-based soup.  3 cups of popcorn.  4 to 6 whole-wheat crackers.   cup cooked beans, peas, or corn. Vegetable Each serving equals 5 grams of carbohydrates.   cup cooked vegetables.  1 cup raw vegetables.   cup tomato or vegetable juice. Fruit Each serving equals 15 grams of carbohydrates.  1 small apple or orange.  1 cup watermelon or strawberries.   cup applesauce (no sugar added).  2 tbs raisins.   banana.   cup canned fruit, packed in water, its own juice, or sweetened with a sugar substitute.   cup unsweetened fruit juice. Dairy Each serving equals 12 to 15 grams of carbohydrates.  1 cup fat-free milk.  6 oz artificially sweetened yogurt or plain yogurt.  1 cup low-fat buttermilk.  1 cup soy milk.  1 cup almond milk. Meat/Protein  1 large egg.  2 to 3 oz meat, poultry, or fish.   cup low-fat cottage cheese.  1 tbs peanut butter.  1 oz low-fat cheese.   cup tuna in water.   cup tofu. Fat  1 tsp oil.  1 tsp trans-fat-free margarine.  1 tsp butter.  1 tsp mayonnaise.  2 tbs avocado.  1 tbs salad dressing.  1 tbs cream cheese.    2 tbs sour cream. SAMPLE 1800 CALORIE DIET PLAN Breakfast   cup unsweetened cereal (1 carb serving).  1 cup fat-free milk (1 carb serving).  1 slice whole-wheat toast (1 carb serving).   small banana (1 carb serving).  1 scrambled egg.  1 tsp trans-fat-free margarine. Lunch  Tuna sandwich.  2 slices whole-wheat bread (2 carb servings).   cup canned tuna in water, drained.  1 tbs reduced fat mayonnaise.  1 stalk celery, chopped.  2 slices tomato.  1 lettuce leaf.  1 cup carrot sticks.  24 to 30 seedless grapes (2 carb  servings).  6 oz light yogurt (1 carb serving). Afternoon Snack  3 graham cracker squares (1 carb serving).  Fat-free milk, 1 cup (1 carb serving).  1 tbs peanut butter. Dinner  3 oz salmon, broiled with 1 tsp oil.  1 cup mashed potatoes (2 carb servings) with 1 tsp trans-fat-free margarine.  1 cup fresh or frozen green beans.  1 cup steamed asparagus.  1 cup fat-free milk (1 carb serving). Evening Snack  3 cups air-popped popcorn (1 carb serving).  2 tbs parmesan cheese sprinkled on top. MEAL PLAN Use this worksheet to help you make a daily meal plan based on the 1800 calorie diet suggestions. If you are using this plan to help you control your blood glucose, you may interchange carbohydrate-containing foods (dairy, starches, and fruits). Select a variety of fresh foods of varying colors and flavors. The total amount of carbohydrate in your meals or snacks is more important than making sure you include all of the food groups every time you eat. Choose from the following foods to build your day's meals:  8 Starches.  4 Vegetables.  3 Fruits.  2 Dairy.  6 to 7 oz Meat/Protein.  Up to 4 Fats. Your dietician can use this worksheet to help you decide how many servings and which types of foods are right for you. BREAKFAST Food Group and Servings / Food Choice Starch ________________________________________________________ Dairy _________________________________________________________ Fruit _________________________________________________________ Meat/Protein __________________________________________________ Fat ___________________________________________________________ LUNCH Food Group and Servings / Food Choice Starch ________________________________________________________ Meat/Protein __________________________________________________ Vegetable _____________________________________________________ Fruit  _________________________________________________________ Dairy _________________________________________________________ Fat ___________________________________________________________ AFTERNOON SNACK Food Group and Servings / Food Choice Starch ________________________________________________________ Meat/Protein __________________________________________________ Fruit __________________________________________________________ Dairy _________________________________________________________ DINNER Food Group and Servings / Food Choice Starch _________________________________________________________ Meat/Protein ___________________________________________________ Dairy __________________________________________________________ Vegetable ______________________________________________________ Fruit ___________________________________________________________ Fat ____________________________________________________________ EVENING SNACK Food Group and Servings / Food Choice Fruit __________________________________________________________ Meat/Protein ___________________________________________________ Dairy __________________________________________________________ Starch _________________________________________________________ DAILY TOTALS Starch ____________________________ Vegetable _________________________ Fruit _____________________________ Dairy _____________________________ Meat/Protein______________________ Fat _______________________________ Document Released: 11/05/2004 Document Revised: 07/08/2011 Document Reviewed: 03/01/2011 [employer] Patient Information 2014 [employer]  

## 2012-10-05 NOTE — Progress Notes (Signed)
  Subjective:    Patient ID: Taylor Jacobson, male    DOB: Sep 03, 1926, 79 y.o.   MRN: [id number]  HPI  Patient here today for a diabetic recheck- His blood sugars were running high- Daughter had not been giving him his lantus. We told them to give him 40 u QHS but daughter afraid to give him that much so she has been giving him only 20 units- Blood sugars have been averaging 180->200.    Review of Systems  Skin: Positive for wound.       Top of head- fell yesterday- went to hospital       Objective:   Physical Exam  Constitutional: He appears well-developed and well-nourished.  Cardiovascular: Normal rate and normal heart sounds.   Pulmonary/Chest: Effort normal and breath sounds normal.  Skin:  Staples top of scalp     BP 116/73  Pulse 53  Temp(Src) 97.2 F (36.2 C) (Oral)  Ht 5\' 5"  (1.651 m)  Wt 218 lb (98.884 kg)  BMI 36.28 kg/m2      Assessment & Plan:  1. Type II or unspecified type diabetes mellitus without mention of complication, not stated as uncontrolled Strict low carb diet Increase lantus to 30 u qhs Continue to keep diary of blood sugars Recheck in 2 months  Mary-Margaret Daphine Deutscher, FNP

## 2012-10-06 ENCOUNTER — Other Ambulatory Visit: Payer: Self-pay | Admitting: Nurse Practitioner

## 2012-10-10 ENCOUNTER — Other Ambulatory Visit: Payer: Self-pay | Admitting: Nurse Practitioner

## 2012-10-13 ENCOUNTER — Other Ambulatory Visit: Payer: Self-pay | Admitting: Surgery

## 2012-10-13 NOTE — Telephone Encounter (Signed)
No note here what does patient need

## 2012-10-14 ENCOUNTER — Ambulatory Visit (INDEPENDENT_AMBULATORY_CARE_PROVIDER_SITE_OTHER): Payer: Medicare Other | Admitting: Physician Assistant

## 2012-10-14 ENCOUNTER — Encounter: Payer: Self-pay | Admitting: Physician Assistant

## 2012-10-14 VITALS — BP 114/66 | HR 57 | Temp 97.8°F | Wt 218.0 lb

## 2012-10-14 DIAGNOSIS — S0100XA Unspecified open wound of scalp, initial encounter: Secondary | ICD-10-CM

## 2012-10-14 DIAGNOSIS — Z4802 Encounter for removal of sutures: Secondary | ICD-10-CM

## 2012-10-14 NOTE — Progress Notes (Signed)
Subjective:     Patient ID: Taylor Jacobson, male   DOB: 19-Jun-1926, 79 y.o.   MRN: [id number]  HPI Pt with fall ` 10 days ago in the house He was going down a step between rooms and fell and hit head She was taken by EMS to [facility] had WU which was negative They placed 5 staples to close scalp wound Pt here today with family to have wound checked and staples removed   Review of Systems  All other systems reviewed and are negative.       Objective:   Physical Exam  Nursing note and vitals reviewed. Pt with elliptical superior frontal scalp wound Edges healing well No surrounding erythema, edema, or induration No drainage from the site Sutures removed .    Assessment:     Open wound scalp Suture removal    Plan:     Again reviewed S/S of infection and wound care F/U prn

## 2012-10-28 ENCOUNTER — Ambulatory Visit: Payer: Medicare Other | Admitting: Cardiology

## 2012-10-29 ENCOUNTER — Other Ambulatory Visit: Payer: Self-pay | Admitting: Dermatology

## 2012-11-03 ENCOUNTER — Ambulatory Visit (INDEPENDENT_AMBULATORY_CARE_PROVIDER_SITE_OTHER): Payer: Medicare Other

## 2012-11-03 ENCOUNTER — Encounter: Payer: Self-pay | Admitting: Family Medicine

## 2012-11-03 ENCOUNTER — Ambulatory Visit (INDEPENDENT_AMBULATORY_CARE_PROVIDER_SITE_OTHER): Payer: Medicare Other | Admitting: Family Medicine

## 2012-11-03 VITALS — BP 102/65 | HR 79 | Temp 98.2°F | Wt 216.0 lb

## 2012-11-03 DIAGNOSIS — R05 Cough: Secondary | ICD-10-CM

## 2012-11-03 DIAGNOSIS — J209 Acute bronchitis, unspecified: Secondary | ICD-10-CM

## 2012-11-03 MED ORDER — PREDNISONE 50 MG PO TABS: ORAL_TABLET | ORAL | Status: DC

## 2012-11-03 MED ORDER — LEVOFLOXACIN 500 MG PO TABS: 500.0000 mg | ORAL_TABLET | Freq: Every day | ORAL | Status: DC

## 2012-11-03 NOTE — Progress Notes (Signed)
Patient ID: Taylor Jacobson, male   DOB: [birth year], 79 y.o.   MRN: [id number] Subjective:  Patient presents today with chief complaint of cough x3-4 days. Patient has a baseline history of ischemic cardiomyopathy and is currently on supple no oxygen. Noted to have elevated back in May for acute respiratory failure secondary to CHF. Patient is been fairly asymptomatic since hospital discharge. Family states patient has had rhinorrhea, nasal congestion as well as cough for the past 3-4 days. No fevers or chills. Positive sick contact wife who had similar symptoms. Some mild wheezing. Patient does not have a formal diagnosis of COPD. However, patient does have an extensive smoking history. Minimal shortness of breath. No increased work of breathing Weight has remained stable.       Review of Systems - 12 point ROS negative except as noted above in HPI  Objective:  Current Outpatient Prescriptions  Medication Sig Dispense Refill  . apixaban (ELIQUIS) 2.5 MG TABS tablet Take 2.5 mg by mouth 2 (two) times daily.      Marland Kitchen atorvastatin (LIPITOR) 20 MG tablet Take 1 tablet (20 mg total) by mouth daily.  30 tablet  3  . B-D INS SYR ULTRAFINE 1CC/31G 31G X 5/16" 1 ML MISC 40 UNITS EVERY DAY  10 each  1  . cloNIDine (CATAPRES) 0.1 MG tablet Take 1 tablet (0.1 mg total) by mouth daily.  30 tablet  3  . furosemide (LASIX) 40 MG tablet Take 1 tablet (40 mg total) by mouth 2 (two) times daily.  60 tablet  3  . insulin glargine (LANTUS) 100 UNIT/ML injection Inject 40 Units into the skin at bedtime.       . levETIRAcetam (KEPPRA) 500 MG tablet Take 500 mg by mouth 2 (two) times daily.      Marland Kitchen lisinopril (PRINIVIL,ZESTRIL) 5 MG tablet Take 1 tablet (5 mg total) by mouth daily.  30 tablet  3  . oxybutynin (DITROPAN-XL) 5 MG 24 hr tablet Take 1 tablet (5 mg total) by mouth daily.  30 tablet  5  . potassium chloride SA (K-DUR,KLOR-CON) 20 MEQ tablet Take 1 tablet (20 mEq total) by mouth daily.  30 tablet   3  . Vitamin D, Ergocalciferol, (DRISDOL) 50000 UNITS CAPS TAKE 1 CAPSULE BY MOUTH EVERY WEEK  12 capsule  1  . solifenacin (VESICARE) 5 MG tablet Take 5 mg by mouth daily.       No current facility-administered medications for this visit.    Wt Readings from Last 3 Encounters:  11/03/12 216 lb (97.977 kg)  10/14/12 218 lb (98.884 kg)  10/05/12 218 lb (98.884 kg)   Temp Readings from Last 3 Encounters:  11/03/12 98.2 F (36.8 C)   10/14/12 97.8 F (36.6 C) Oral  10/05/12 97.2 F (36.2 C) Oral   BP Readings from Last 3 Encounters:  11/03/12 102/65  10/14/12 114/66  10/05/12 116/73   Pulse Readings from Last 3 Encounters:  11/03/12 79  10/14/12 57  10/05/12 53   General: alert, cooperative and nasal cannula in place, no increased WOB  HEENT: extra ocular movement intact Heart: S1, S2 normal, no murmur, rub or gallop, regular rate and rhythm Lungs: faint wheezes in apices, no rales, normal resp effort  Abdomen: abdomen is soft without significant tenderness, masses, organomegaly or guarding Extremities: extremities normal, atraumatic, no cyanosis or edema Skin:no rashes Neurology: normal without focal findings  [facility] reading (PRIMARY) by  Dr. Alvester Morin CXR read preliminarily with pulmonary vascular congestion and cardiomegaly. Improved  from previous CXR. Unclear if infiltrate present, though no focality noted.                                      Assessment/Plan:  Cough - Plan: DG Chest 2 View, Pro b natriuretic peptide  Acute bronchitis - Plan: predniSONE (DELTASONE) 50 MG tablet, levofloxacin (LEVAQUIN) 500 MG tablet   Suspect this is a likely a viral process with rhinorrhea being the predominant cause of cough. However, patient does have some risk factors including CHF and questionable COPD. Weight is 2 pounds down from last clinical visit. Doubt CHF exacerbation causing symptoms. Will check proBNP to correlate. Clinically dry on exam. Will place patient on 5 day  course of prednisone given wheezing. Will place on Levaquin for age Coverage given the patient was recently hospitalized from last 3 months. QTC on most recent EKG within normal limits. Discussed cardiorespiratory as well as infectious red flags his family at length. Followup in one week for reevaluation of symptoms with PCP.

## 2012-11-06 LAB — PRO B NATRIURETIC PEPTIDE: Pro B Natriuretic peptide (BNP): 2483 pg/mL — ABNORMAL HIGH (ref <451)

## 2012-12-07 ENCOUNTER — Encounter: Payer: Self-pay | Admitting: Nurse Practitioner

## 2012-12-07 ENCOUNTER — Ambulatory Visit (INDEPENDENT_AMBULATORY_CARE_PROVIDER_SITE_OTHER): Payer: Medicare Other | Admitting: Nurse Practitioner

## 2012-12-07 ENCOUNTER — Telehealth: Payer: Self-pay | Admitting: Nurse Practitioner

## 2012-12-07 VITALS — BP 113/63 | HR 60 | Temp 96.7°F | Ht 65.0 in | Wt 219.0 lb

## 2012-12-07 DIAGNOSIS — IMO0001 Reserved for inherently not codable concepts without codable children: Secondary | ICD-10-CM

## 2012-12-07 DIAGNOSIS — I509 Heart failure, unspecified: Secondary | ICD-10-CM

## 2012-12-07 DIAGNOSIS — I5043 Acute on chronic combined systolic (congestive) and diastolic (congestive) heart failure: Secondary | ICD-10-CM

## 2012-12-07 MED ORDER — INSULIN GLARGINE 100 UNIT/ML ~~LOC~~ SOLN: 30.0000 [IU] | Freq: Every day | SUBCUTANEOUS | Status: DC

## 2012-12-07 NOTE — Progress Notes (Signed)
  Subjective:    Patient ID: Taylor Jacobson, male    DOB: March 27, 1927, 79 y.o.   MRN: [id number]  HPI  Patient here today for follow up of congestive heart failure- He was in the hospital fro a few days 2 months ago- He is doing well from that- Daughter says his dementia is getting worse- Hallucinating, talking to people that aren't there and sometimes they have no idea what he is talking about.- His daughter and wife are with him all the time.    Review of Systems  Respiratory: Negative for cough, chest tightness and shortness of breath.   Cardiovascular: Negative for chest pain, palpitations and leg swelling.  All other systems reviewed and are negative.       Objective:   Physical Exam  Constitutional: He is oriented to person, place, and time. He appears well-developed and well-nourished.  Cardiovascular: Normal rate and normal heart sounds.   Pulmonary/Chest: Effort normal and breath sounds normal.  Musculoskeletal: He exhibits no edema.  Neurological: He is alert and oriented to person, place, and time.   BP 113/63  Pulse 60  Temp(Src) 96.7 F (35.9 C) (Oral)  Ht 5\' 5"  (1.651 m)  Wt 219 lb (99.338 kg)  BMI 36.44 kg/m2        Assessment & Plan:  1. CHF (congestive heart failure), acute on chronic, combined Continue to limit fluids O2 via cannulla Labs pending  2. 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

## 2012-12-07 NOTE — Patient Instructions (Addendum)
Heart Failure  Heart failure is a condition in which the heart has trouble pumping blood. This means your heart does not pump blood efficiently for your body to work well. In some cases of heart failure, fluid may back up into your lungs or you may have swelling (edema) in your lower legs. Heart failure is a long-term (chronic) condition. It is important for you to take good care of yourself and follow your caregiver's treatment plan.  CAUSES    Health conditions:   High blood pressure (hypertension) causes the heart muscle to work harder than normal. When pressure in the blood vessels is high, the heart needs to pump (contract) with more force in order to circulate blood throughout the body. High blood pressure eventually causes the heart to become stiff and weak.   Coronary artery disease (CAD) is the buildup of cholesterol and fat (plaque) in the arteries of the heart. The blockage in the arteries deprives the heart muscle of oxygen and blood. This can cause chest pain and may lead to a heart attack. High blood pressure can also contribute to CAD.   Heart attack (myocardial infarction) occurs when 1 or more arteries in the heart become blocked. The loss of oxygen damages the muscle tissue of the heart. When this happens, part of the heart muscle dies. The injured tissue does not contract as well and weakens the heart's ability to pump blood.   Abnormal heart valves can cause heart failure when the heart valves do not open and close properly. This makes the heart muscle pump harder to keep the blood flowing.   Heart muscle disease (cardiomyopathy or myocarditis) is damage to the heart muscle from a variety of causes. These can include drug or alcohol abuse, infections, or unknown reasons. These can increase the risk of heart failure.   Lung disease makes the heart work harder because the lungs do not work properly. This can cause a strain on the heart, leading it to fail.    Diabetes increases the risk of heart failure. High blood sugar contributes to high fat (lipid) levels in the blood. Diabetes can also cause slow damage to tiny blood vessels that carry important nutrients to the heart muscle. When the heart does not get enough oxygen and food, it can cause the heart to become weak and stiff. This leads to a heart that does not contract efficiently.   Other diseases can contribute to heart failure. These include abnormal heart rhythms, thyroid problems, and low blood counts (anemia).   Unhealthy behaviors:   Being overweight.   Smoking or chewing tobacco.   Eating foods high in fat and cholesterol.   Eating foods or drinking beverages high in salt.   Abusing drugs or alcohol.   Lacking physical activity.  SYMPTOMS   Heart failure symptoms may vary and can be hard to detect. Symptoms may include:   Shortness of breath with activity, such as climbing stairs.   Persistent cough.   Swelling of the feet, ankles, legs, or abdomen.   Unexplained, sudden weight gain.   Difficulty breathing when lying flat (orthopnea).   Waking from sleep because of the need to sit up and get more air.   Rapid heartbeat.   Fatigue and loss of energy.   Feeling lightheaded, dizzy, or close to fainting.   Loss of appetite.   Nausea.   Increased urination during the night (nocturia).  DIAGNOSIS   A diagnosis of heart failure is based on your history, symptoms, physical   examination, and diagnostic tests.  Diagnostic tests for heart failure may include:   Electrocardiography.   Chest X-ray.   Blood tests.   Exercise stress test.   Echocardiography.   Cardiac angiography.   Radionuclide scans.  TREATMENT   Treatment is aimed at managing the symptoms of heart failure. Medicines, behavioral changes, or surgical intervention may be necessary to treat heart failure.   Medicines to help treat heart failure may include:    Angiotensin-converting enzyme (ACE) inhibitors. This type of medicine blocks the effects of a blood protein called angiotensin-converting enzyme. ACE inhibitors relax (dilate) the blood vessels and help lower blood pressure.   Angiotensin receptor blockers. This type of medicine blocks the actions of a blood protein called angiotensin. Angiotensin receptor blockers dilate the blood vessels and help lower blood pressure.   Aldosterone antagonists. This type of medicine helps rid your body of extra fluid. This loss of extra fluid lowers the volume of blood the heart pumps.   Water pills (diuretics). Diuretics cause the kidneys to remove salt and water from the blood. The extra fluid is removed through urination.   Beta blockers. These prevent the heart from beating too fast and improve heart muscle strength.   Digitalis. This increases the force of the heartbeat.   Healthy behavior changes include:   Obtaining and maintaining a healthy weight.   Stopping smoking or chewing tobacco.   Eating heart healthy foods.   Limiting or avoiding alcohol.   Stopping drug use.   Becoming as physically active as possible.   Surgical treatment for heart failure may include:   A procedure to open blocked arteries, repair damaged heart valves, or remove damaged heart muscle tissue.   A pacemaker to improve heart muscle function and control certain abnormal heart rhythms.   An internal cardioverter defibrillator to possibly prevent sudden cardiac death.   A left ventricular assist device to assist the pumping ability of the heart.  HOME CARE INSTRUCTIONS    Take your medicine as directed by your caregiver. Medicines are important in reducing the workload of your heart, slowing the progression of heart failure, and improving your symptoms.   Do not stop taking your medicine unless directed by your caregiver.   Do not skip any dose of medicine.    Refill your prescriptions before you run out of medicine. Your medicines are needed every day.   Take over-the-counter medicine only as directed by your caregiver or pharmacist.   Stay physically active. Your caregiver or cardiac rehabilitation program can help determine your level of physical activity. It is important to follow your specific physical activity program. Regular physical activity can control weight and improve your energy, endurance, health, cholesterol levels, mood, and nighttime sleep. It is important to pace your physical activity to avoid shortness of breath or chest pain. It is recommended to rest for 1 hour before and after meals.   Eat heart healthy foods. Food choices should be low in saturated fat, trans fat, cholesterol, and salt (sodium). Healthy choices include fresh or frozen fruits and vegetables, fish, lean meats, legumes, fat-free or low-fat dairy products, and whole grain or high fiber foods. Limit your sodium to 1500 milligrams (mg) each day or as recommended by your caregiver. Talk to a dietitian to learn more about heart healthy foods and healthy seasonings.   Use healthy cooking methods. Healthy cooking methods include roasting, grilling, broiling, baking, poaching, steaming, or stir-frying. Talk to a dietitian to learn more about healthy cooking methods.     Limit fluids if directed by your caregiver. Fluid restriction may reduce symptoms of heart failure in some individuals.   Weigh yourself every day. Daily weights are important in the early recognition of excess fluid. You should weigh yourself every morning after you urinate and before you eat breakfast. Wear the same amount of clothing each time you weigh yourself. Record your daily weight. Provide your caregiver with your weight record.   Monitor and record your blood pressure if directed by your caregiver.   Check your pulse if directed by your caregiver.    If you are overweight, it is time to lose and maintain a healthy weight.   Stop smoking or chewing tobacco. Nicotine makes your heart work harder by causing your blood vessels to constrict. Do not use nicotine gum or patches before talking to your caregiver.   Schedule and attend follow-up visits as directed by your caregiver. It is important to keep all your appointments.   Limit alcohol intake to no more than 1 drink per day for nonpregnant women and 2 drinks per day for men. Drinking more than that is harmful to your heart. Tell your caregiver if you drink alcohol several times a week. Talk with your caregiver about whether alcohol is safe for you. If your heart has already been damaged by alcohol or you have severe heart failure, drinking alcohol should be stopped completely.   Stop illegal drug use.   Stay up-to-date with immunizations. It is especially important to prevent respiratory infections through current pneumococcal and influenza immunizations.   Manage other health conditions such as hypertension, diabetes, thyroid disease, or abnormal heart rhythms as directed by your caregiver.   Learn to manage stress.   Plan rest periods when fatigued.   Learn strategies to manage high temperatures. If the weather is extremely hot:   Avoid vigorous physical activity.   Use air conditioning or fans or seek a cooler location.   Avoid caffeine and alcohol.   Wear loose-fitting, lightweight, and light-colored clothing.   Learn strategies to manage cold temperatures. If the weather is extremely cold:   Avoid vigorous physical activity.   Layer clothes.   Wear mittens or gloves, a hat, and a scarf when going outside.   Avoid alcohol.   Obtain ongoing education and support as needed.   Participate or seek rehabilitation as needed to maintain or improve independence and quality of life.  SEEK MEDICAL CARE IF:    Your weight increases by 3 lb/1.4 kg in 1 day or 5 lb/2.3 kg in a week.    You have increasing shortness of breath that is unusual for you.   You are unable to participate in your usual physical activities.   You tire easily.   You cough more than normal, especially with physical activity.   You have any or more swelling in areas such as your hands, feet, ankles, or abdomen.   You are unable to sleep because it is hard to breathe.   You cough up bloody mucus (sputum).   You feel like your heart is beating fast (palpitations).   You become dizzy or lightheaded upon standing up.  SEEK IMMEDIATE MEDICAL CARE IF:    You have difficulty breathing.   There is a change in mental status such as decreased alertness or difficulty with concentration.   You have a pain or discomfort in your chest.   You have an episode of fainting (syncope).   You are in an area of high temperatures and   you have signs of heat exhaustion:   Heavy sweating.   Muscle cramps.   Weakness.   Dizziness.   Headaches.   Fainting.   You are in an area of cold temperatures and you have signs of hypothermia:   Clumsiness.   Confusion.   Sleepiness.   Shivering.  MAKE SURE YOU:    Understand these instructions.   Will watch your condition.   Will get help right away if you are not doing well or get worse.  Document Released: 04/15/2005 Document Revised: 04/01/2012 Document Reviewed: 11/14/2011  [employer] Patient Information 2014 [employer].

## 2012-12-08 MED ORDER — LORATADINE 10 MG PO TABS: 10.0000 mg | ORAL_TABLET | Freq: Every day | ORAL | Status: DC

## 2012-12-08 MED ORDER — LEVETIRACETAM 500 MG PO TABS: 500.0000 mg | ORAL_TABLET | Freq: Two times a day (BID) | ORAL | Status: DC

## 2012-12-08 NOTE — Telephone Encounter (Signed)
Already done this AM

## 2012-12-09 ENCOUNTER — Ambulatory Visit (INDEPENDENT_AMBULATORY_CARE_PROVIDER_SITE_OTHER): Payer: Medicare Other | Admitting: Cardiology

## 2012-12-09 ENCOUNTER — Encounter: Payer: Self-pay | Admitting: Cardiology

## 2012-12-09 VITALS — BP 128/61 | HR 75 | Ht 65.0 in | Wt 221.0 lb

## 2012-12-09 DIAGNOSIS — I4891 Unspecified atrial fibrillation: Secondary | ICD-10-CM

## 2012-12-09 NOTE — Progress Notes (Signed)
HPI The patient presents as a new patient for evaluation of coronary disease and atrial fibrillation.In 2009 he had a subdural hematoma and wasn't expected to live according to his family. Since he has survived they reestablished with cardiology recently. He has a history of coronary disease with CABG. He has an ischemic cardiomyopathy with an EF of 35% on cath in 2002.   Since our first visit in March I did start him on systemic anticoagulation given his high risk for stroke. I also did an echocardiogram and noted that his ejection fraction is now about 25%.  Since I last saw him he has had no acute shortness of breath, PND or orthopnea. He has had no new chest discomfort. He's not reporting any presyncope or syncope.  He wears oxygen which he had since hospitalization earlier this year. He is minimally active at home but in no distress with this activity.  No Known Allergies  Current Outpatient Prescriptions  Medication Sig Dispense Refill  . apixaban (ELIQUIS) 2.5 MG TABS tablet Take 2.5 mg by mouth 2 (two) times daily.      Marland Kitchen atorvastatin (LIPITOR) 20 MG tablet Take 1 tablet (20 mg total) by mouth daily.  30 tablet  3  . B-D INS SYR ULTRAFINE 1CC/31G 31G X 5/16" 1 ML MISC 40 UNITS EVERY DAY  10 each  1  . cloNIDine (CATAPRES) 0.1 MG tablet Take 1 tablet (0.1 mg total) by mouth daily.  30 tablet  3  . furosemide (LASIX) 40 MG tablet Take 1 tablet (40 mg total) by mouth 2 (two) times daily.  60 tablet  3  . insulin glargine (LANTUS) 100 UNIT/ML injection Inject 0.3 mLs (30 Units total) into the skin at bedtime.  10 mL  3  . levETIRAcetam (KEPPRA) 500 MG tablet Take 1 tablet (500 mg total) by mouth 2 (two) times daily.  60 tablet  5  . lisinopril (PRINIVIL,ZESTRIL) 5 MG tablet Take 1 tablet (5 mg total) by mouth daily.  30 tablet  3  . loratadine (CLARITIN) 10 MG tablet Take 1 tablet (10 mg total) by mouth daily.  30 tablet  5  . oxybutynin (DITROPAN-XL) 5 MG 24 hr tablet Take 1 tablet (5 mg  total) by mouth daily.  30 tablet  5  . potassium chloride SA (K-DUR,KLOR-CON) 20 MEQ tablet Take 1 tablet (20 mEq total) by mouth daily.  30 tablet  3  . Vitamin D, Ergocalciferol, (DRISDOL) 50000 UNITS CAPS TAKE 1 CAPSULE BY MOUTH EVERY WEEK  12 capsule  1   No current facility-administered medications for this visit.    Past Medical History  Diagnosis Date  . CAD (coronary artery disease)     a. 1997 s/p CABG x 4: LIMA->LAD, VG->Diag, VG->OM, VG->dRCA;  b. 07/2000 Cath: LM nl, LAD 62m, RI 99p, LCX 100p, RCA 90, VG->RCA nl, VG->OM nl, VG->Diag nl, LIMA->LAD nl.  . Diabetes   . Chronic systolic CHF (congestive heart failure)     a. 07/2012 Echo: EF 25-30%, diff HK, triv AI, mild MR, mod dil LA, mod reduced RV syst fxn, mildly dil RA.  . Cardiomyopathy, ischemic   . Obesity   . HTN (hypertension), benign   . Hyperlipidemia   . Subdural hematoma     a. 2009.  Marland Kitchen OSA (obstructive sleep apnea)     a. uses CPAP  . Atrial fibrillation     a. Eliquis started 06/2012.    Past Surgical History  Procedure Laterality Date  .  Coronary artery bypass graft      1997 L - LAD, SVG D1, SVG OM, SVG RCA.  Grafts patent on cath 2002  . Hemorrhoid surgery    . Cholecystectomy    . Bilateral total knee replacements    . Nasal septum surgery       ROS:  As stated in the HPI and negative for all other systems.  PHYSICAL EXAM BP 128/61  Pulse 75  Ht 5\' 5"  (1.651 m)  Wt 221 lb (100.245 kg)  BMI 36.78 kg/m2 GENERAL:  Frail appearing, but no distress NECK:  No jugular venous distention, waveform within normal limits, carotid upstroke brisk and symmetric, no bruits, no thyromegaly LUNGS:  Clear to auscultation bilaterally BACK:  No CVA tenderness HEART:  PMI not displaced or sustained,S1 and S2 within normal limits, no S3,  no clicks, no rubs, no murmurs, irreugular ABD:  Flat, positive bowel sounds normal in frequency in pitch, no bruits, no rebound, no guarding, no midline pulsatile mass, no  hepatomegaly, no splenomegaly EXT:  2 plus pulses throughout, no edema, no cyanosis no clubbing, dependent rubor SKIN:  No rashes no nodules   EKG:  Atrial flutter her rate 75, premature ectopic complexes, no acute ST-T wave changes.  ASSESSMENT AND PLAN  ISCHEMIC CARDIOMYOPATHY:   He seems to be euvolemic and  I will suggest no change in the therapy.  ATRIAL FIBRILLATION:  The patient is unaware of this rhythm. He has no contraindication to anticoagulation.  Mr. Donaciano Range has a CHA2DS2 - VASc score of 6 with a risk of stroke of 9.8%  and a HAS - BLED score of 2 with a low bleeding risk.  He will remain on the Eliquis.  I w  CAD:  He has no acute symptoms. He will continue with risk reduction.

## 2012-12-09 NOTE — Patient Instructions (Addendum)
Your physician wants you to follow-up in: 6 MONTH FOLLOW UP WITH DR. HOCHREIN. You will receive a reminder letter in the mail two months in advance. If you don't receive a letter, please call our office to schedule the follow-up appointment.  NO CHANGES WERE MADE TODAY

## 2012-12-10 ENCOUNTER — Telehealth: Payer: Self-pay | Admitting: Nurse Practitioner

## 2012-12-10 LAB — CMP14+EGFR
AST: 21 IU/L (ref 0–40)
Albumin: 3.8 g/dL (ref 3.5–4.7)
Alkaline Phosphatase: 90 IU/L (ref 39–117)
BUN/Creatinine Ratio: 22 (ref 10–22)
BUN: 37 mg/dL — ABNORMAL HIGH (ref 8–27)
CO2: 25 mmol/L (ref 18–29)
Chloride: 100 mmol/L (ref 97–108)
Sodium: 141 mmol/L (ref 134–144)

## 2012-12-10 LAB — BRAIN NATRIURETIC PEPTIDE: BNP: 202.9 pg/mL — ABNORMAL HIGH (ref 0.0–100.0)

## 2012-12-10 NOTE — Telephone Encounter (Signed)
Patient daughter aware of results  

## 2012-12-24 ENCOUNTER — Other Ambulatory Visit (HOSPITAL_COMMUNITY): Payer: Self-pay | Admitting: Cardiology

## 2013-01-01 ENCOUNTER — Other Ambulatory Visit: Payer: Self-pay | Admitting: Nurse Practitioner

## 2013-01-03 ENCOUNTER — Other Ambulatory Visit: Payer: Self-pay | Admitting: Nurse Practitioner

## 2013-01-10 ENCOUNTER — Other Ambulatory Visit: Payer: Self-pay | Admitting: Nurse Practitioner

## 2013-01-11 NOTE — Telephone Encounter (Signed)
Last lipids 01/09/12  MMM

## 2013-01-26 NOTE — Telephone Encounter (Signed)
Pt.notified

## 2013-02-02 ENCOUNTER — Other Ambulatory Visit (INDEPENDENT_AMBULATORY_CARE_PROVIDER_SITE_OTHER): Payer: Medicare Other

## 2013-02-02 DIAGNOSIS — M109 Gout, unspecified: Secondary | ICD-10-CM

## 2013-02-03 LAB — URIC ACID: Uric Acid: 10.4 mg/dL — ABNORMAL HIGH (ref 3.7–8.6)

## 2013-02-04 ENCOUNTER — Other Ambulatory Visit: Payer: Self-pay | Admitting: Nurse Practitioner

## 2013-02-24 ENCOUNTER — Other Ambulatory Visit (HOSPITAL_COMMUNITY): Payer: Self-pay | Admitting: Cardiology

## 2013-03-09 ENCOUNTER — Ambulatory Visit (INDEPENDENT_AMBULATORY_CARE_PROVIDER_SITE_OTHER): Payer: Medicare Other | Admitting: Nurse Practitioner

## 2013-03-09 ENCOUNTER — Encounter: Payer: Self-pay | Admitting: Nurse Practitioner

## 2013-03-09 VITALS — BP 116/77 | HR 73 | Temp 97.5°F | Ht 65.0 in | Wt 223.0 lb

## 2013-03-09 DIAGNOSIS — Z23 Encounter for immunization: Secondary | ICD-10-CM

## 2013-03-09 DIAGNOSIS — E119 Type 2 diabetes mellitus without complications: Secondary | ICD-10-CM

## 2013-03-09 DIAGNOSIS — I10 Essential (primary) hypertension: Secondary | ICD-10-CM

## 2013-03-09 DIAGNOSIS — E785 Hyperlipidemia, unspecified: Secondary | ICD-10-CM

## 2013-03-09 DIAGNOSIS — I4891 Unspecified atrial fibrillation: Secondary | ICD-10-CM

## 2013-03-09 DIAGNOSIS — E7800 Pure hypercholesterolemia, unspecified: Secondary | ICD-10-CM

## 2013-03-09 DIAGNOSIS — I2510 Atherosclerotic heart disease of native coronary artery without angina pectoris: Secondary | ICD-10-CM

## 2013-03-09 MED ORDER — GLUCOSE BLOOD VI STRP: ORAL_STRIP | Status: DC

## 2013-03-09 MED ORDER — APIXABAN 2.5 MG PO TABS: 2.5000 mg | ORAL_TABLET | Freq: Two times a day (BID) | ORAL | Status: DC

## 2013-03-09 NOTE — Progress Notes (Signed)
Subjective:    Patient ID: Taylor Jacobson, male    DOB: Sep 27, 1926, 79 y.o.   MRN: [id number]  HPI  Patient here today for follow up- very difficult to evaluate because of his dementia- speech is slurred and difficult to understand-daughter is in room with him and she is able to answer lots of questions. Daughter says that he is doing ok- blood sugars at home are running around 90 in AM and around 160 -200 in evenings. He has no complaints today- wears oxygen continuously via nasal cannulla.    Review of Systems  Constitutional: Negative.   HENT: Negative.   Eyes: Negative.   Respiratory: Positive for shortness of breath and wheezing.   Cardiovascular: Negative for chest pain, palpitations and leg swelling.  Genitourinary: Negative.   Musculoskeletal: Negative.   Psychiatric/Behavioral: Positive for hallucinations and confusion. Negative for sleep disturbance. The patient is not nervous/anxious.   All other systems reviewed and are negative.       Objective:   Physical Exam  Constitutional: He is oriented to person, place, and time. He appears well-developed and well-nourished.  HENT:  Head: Normocephalic.  Right Ear: External ear normal.  Left Ear: External ear normal.  Nose: Nose normal.  Mouth/Throat: Oropharynx is clear and moist.  Eyes: EOM are normal. Pupils are equal, round, and reactive to light.  Neck: Normal range of motion. Neck supple. No JVD present. No thyromegaly present.  Cardiovascular: Normal rate, regular rhythm, normal heart sounds and intact distal pulses.  Exam reveals no gallop and no friction rub.   No murmur heard. Pulmonary/Chest: Effort normal and breath sounds normal. No respiratory distress. He has no wheezes. He has no rales. He exhibits no tenderness.  o2 via cannlla in place  Abdominal: Soft. Bowel sounds are normal. He exhibits no mass. There is no tenderness.  Genitourinary:  No rectal exam today  Musculoskeletal: Normal range of motion. He  exhibits no edema.  Lymphadenopathy:    He has no cervical adenopathy.  Neurological: He is alert and oriented to person, place, and time. No cranial nerve deficit.  Skin: Skin is warm and dry.  Psychiatric: He has a normal mood and affect. His behavior is normal. Judgment and thought content normal.    BP 116/77  Pulse 73  Temp(Src) 97.5 F (36.4 C) (Oral)  Ht 5\' 5"  (1.651 m)  Wt 223 lb (101.152 kg)  BMI 37.11 kg/m2 Results for orders placed in visit on 03/09/13  POCT GLYCOSYLATED HEMOGLOBIN (HGB A1C)      Result Value Range   Hemoglobin A1C 6.1%           Assessment & Plan:    1. DIABETES MELLITUS   2. HYPERCHOLESTEROLEMIA   3. HYPERTENSION   4. CORONARY ARTERY DISEASE   5. Hyperlipidemia   6. Atrial fibrillation    Orders Placed This Encounter  Procedures  . CMP14+EGFR  . NMR, lipoprofile  . POCT glycosylated hemoglobin (Hb A1C)   Meds ordered this encounter  Medications  . apixaban (ELIQUIS) 2.5 MG TABS tablet    Sig: Take 1 tablet (2.5 mg total) by mouth 2 (two) times daily.    Dispense:  180 tablet    Refill:  1    Order Specific Question:  Supervising Provider    Answer:  Deborra Medina    Continue all meds Labs pending Diet and exercise encouraged Health maintenance reviewed Follow up in 3 months Flu and pneumonia vaccine today  Mary-Margaret Daphine Deutscher, FNP

## 2013-03-09 NOTE — Patient Instructions (Addendum)
Health Maintenance, Males A healthy lifestyle and preventative care can promote health and wellness.  Maintain regular health, dental, and eye exams.  Eat a healthy diet. Foods like vegetables, fruits, whole grains, low-fat dairy products, and lean protein foods contain the nutrients you need without too many calories. Decrease your intake of foods high in solid fats, added sugars, and salt. Get information about a proper diet from your caregiver, if necessary.  Regular physical exercise is one of the most important things you can do for your health. Most adults should get at least 150 minutes of moderate-intensity exercise (any activity that increases your heart rate and causes you to sweat) each week. In addition, most adults need muscle-strengthening exercises on 2 or more days a week.   Maintain a healthy weight. The body mass index (BMI) is a screening tool to identify possible weight problems. It provides an estimate of body fat based on height and weight. Your caregiver can help determine your BMI, and can help you achieve or maintain a healthy weight. For adults 20 years and older:  A BMI below 18.5 is considered underweight.  A BMI of 18.5 to 24.9 is normal.  A BMI of 25 to 29.9 is considered overweight.  A BMI of 30 and above is considered obese.  Maintain normal blood lipids and cholesterol by exercising and minimizing your intake of saturated fat. Eat a balanced diet with plenty of fruits and vegetables. Blood tests for lipids and cholesterol should begin at age 20 and be repeated every 5 years. If your lipid or cholesterol levels are high, you are over 50, or you are a high risk for heart disease, you may need your cholesterol levels checked more frequently.Ongoing high lipid and cholesterol levels should be treated with medicines, if diet and exercise are not effective.  If you smoke, find out from your caregiver how to quit. If you do not use tobacco, do not start.  Lung  cancer screening is recommended for adults aged 55 80 years who are at high risk for developing lung cancer because of a history of smoking. Yearly low-dose computed tomography (CT) is recommended for people who have at least a 30-pack-year history of smoking and are a current smoker or have quit within the past 15 years. A pack year of smoking is smoking an average of 1 pack of cigarettes a day for 1 year (for example: 1 pack a day for 30 years or 2 packs a day for 15 years). Yearly screening should continue until the smoker has stopped smoking for at least 15 years. Yearly screening should also be stopped for people who develop a health problem that would prevent them from having lung cancer treatment.  If you choose to drink alcohol, do not exceed 2 drinks per day. One drink is considered to be 12 ounces (355 mL) of beer, 5 ounces (148 mL) of wine, or 1.5 ounces (44 mL) of liquor.  Avoid use of street drugs. Do not share needles with anyone. Ask for help if you need support or instructions about stopping the use of drugs.  High blood pressure causes heart disease and increases the risk of stroke. Blood pressure should be checked at least every 1 to 2 years. Ongoing high blood pressure should be treated with medicines if weight loss and exercise are not effective.  If you are 45 to 77 years old, ask your caregiver if you should take aspirin to prevent heart disease.  Diabetes screening involves taking a blood   sample to check your fasting blood sugar level. This should be done once every 3 years, after age 44, if you are within normal weight and without risk factors for diabetes. Testing should be considered at a younger age or be carried out more frequently if you are overweight and have at least 1 risk factor for diabetes.  Colorectal cancer can be detected and often prevented. Most routine colorectal cancer screening begins at the age of 43 and continues through age 68. However, your caregiver may  recommend screening at an earlier age if you have risk factors for colon cancer. On a yearly basis, your caregiver may provide home test kits to check for hidden blood in the stool. Use of a small camera at the end of a tube, to directly examine the colon (sigmoidoscopy or colonoscopy), can detect the earliest forms of colorectal cancer. Talk to your caregiver about this at age 26, when routine screening begins. Direct examination of the colon should be repeated every 5 to 10 years through age 31, unless early forms of pre-cancerous polyps or small growths are found.  Hepatitis C blood testing is recommended for all people born from [birth year] through 1963 and any individual with known risks for hepatitis C.  Healthy men should no longer receive prostate-specific antigen (PSA) blood tests as part of routine cancer screening. Consult with your caregiver about prostate cancer screening.  Testicular cancer screening is not recommended for adolescents or adult males who have no symptoms. Screening includes self-exam, caregiver exam, and other screening tests. Consult with your caregiver about any symptoms you have or any concerns you have about testicular cancer.  Practice safe sex. Use condoms and avoid high-risk sexual practices to reduce the spread of sexually transmitted infections (STIs).  Use sunscreen. Apply sunscreen liberally and repeatedly throughout the day. You should seek shade when your shadow is shorter than you. Protect yourself by wearing long sleeves, pants, a wide-brimmed hat, and sunglasses year round, whenever you are outdoors.  Notify your caregiver of new moles or changes in moles, especially if there is a change in shape or color. Also notify your caregiver if a mole is larger than the size of a pencil eraser.  A one-time screening for abdominal aortic aneurysm (AAA) and surgical repair of large AAAs by sound wave imaging (ultrasonography) is recommended for ages 47 to 81 years who are  current or former smokers.  Stay current with your immunizations. Document Released: 10/12/2007 Document Revised: 08/10/2012 Document Reviewed: 09/10/2010 [facility] Patient Information 2014 [place], [place]. Pneumococcal Vaccine, Polyvalent solution for injection What is this medicine? PNEUMOCOCCAL VACCINE, POLYVALENT (NEU mo KOK al vak SEEN, pol ee VEY luhnt) is a vaccine to prevent pneumococcus bacteria infection. These bacteria are a major cause of ear infections, Strep throat infections, and serious pneumonia, meningitis, or blood infections worldwide. These vaccines help the body to produce antibodies (protective substances) that help your body defend against these bacteria. This vaccine is recommended for people 79 years of age and older with health problems. It is also recommended for all adults over 43 years old. This vaccine will not treat an infection. This medicine may be used for other purposes; ask your health care provider or pharmacist if you have questions. COMMON BRAND NAME(S): Pneumovax 23 What should I tell my health care provider before I take this medicine? They need to know if you have any of these conditions: -bleeding problems -bone marrow or organ transplant -cancer, Hodgkin's disease -fever -infection -immune system problems -low platelet  count in the blood -seizures -an unusual or allergic reaction to pneumococcal vaccine, diphtheria toxoid, other vaccines, latex, other medicines, foods, dyes, or preservatives -pregnant or trying to get pregnant -breast-feeding How should I use this medicine? This vaccine is for injection into a muscle or under the skin. It is given by a health care professional. A copy of Vaccine Information Statements will be given before each vaccination. Read this sheet carefully each time. The sheet may change frequently. Talk to your pediatrician regarding the use of this medicine in children. While this drug may be prescribed for children as  young as 26 years of age for selected conditions, precautions do apply. Overdosage: If you think you have taken too much of this medicine contact a poison control center or emergency room at once. NOTE: This medicine is only for you. Do not share this medicine with others. What if I miss a dose? It is important not to miss your dose. Call your doctor or health care professional if you are unable to keep an appointment. What may interact with this medicine? -medicines for cancer chemotherapy -medicines that suppress your immune function -medicines that treat or prevent blood clots like warfarin, enoxaparin, and dalteparin -steroid medicines like prednisone or cortisone This list may not describe all possible interactions. Give your health care provider a list of all the medicines, herbs, non-prescription drugs, or dietary supplements you use. Also tell them if you smoke, drink alcohol, or use illegal drugs. Some items may interact with your medicine. What should I watch for while using this medicine? Mild fever and pain should go away in 3 days or less. Report any unusual symptoms to your doctor or health care professional. What side effects may I notice from receiving this medicine? Side effects that you should report to your doctor or health care professional as soon as possible: -allergic reactions like skin rash, itching or hives, swelling of the face, lips, or tongue -breathing problems -confused -fever over 102 degrees F -pain, tingling, numbness in the hands or feet -seizures -unusual bleeding or bruising -unusual muscle weakness Side effects that usually do not require medical attention (report to your doctor or health care professional if they continue or are bothersome): -aches and pains -diarrhea -fever of 102 degrees F or less -headache -irritable -loss of appetite -pain, tender at site where injected -trouble sleeping This list may not describe all possible side effects.  Call your doctor for medical advice about side effects. You may report side effects to FDA at 1-800-FDA-1088. Where should I keep my medicine? This does not apply. This vaccine is given in a clinic, pharmacy, doctor's office, or other health care setting and will not be stored at home. NOTE: This sheet is a summary. It may not cover all possible information. If you have questions about this medicine, talk to your doctor, pharmacist, or health care provider.  2014, Elsevier/Gold Standard. (2007-11-20 14:32:37) Influenza Vaccine (Flu Vaccine, Inactivated) 2013 2014 What You Need to Know WHY GET VACCINATED?  Influenza ("flu") is a contagious disease that spreads around the [place] every winter, usually between October and May.  Flu is caused by the influenza virus, and can be spread by coughing, sneezing, and close contact.  Anyone can get flu, but the risk of getting flu is highest among children. Symptoms come on suddenly and may last several days. They can include:  Fever or chills.  Sore throat.  Muscle aches.  Fatigue.  Cough.  Headache.  Runny or stuffy nose.  Flu can make some people much sicker than others. These people include young children, people 25 and older, pregnant women, and people with certain health conditions such as heart, lung or kidney disease, or a weakened immune system. Flu vaccine is especially important for these people, and anyone in close contact with them. Flu can also lead to pneumonia, and make existing medical conditions worse. It can cause diarrhea and seizures in children. Each year thousands of people in the [place] die from flu, and many more are hospitalized. Flu vaccine is the best protection we have from flu and its complications. Flu vaccine also helps prevent spreading flu from person to person. INACTIVATED FLU VACCINE There are 2 types of influenza vaccine:  You are getting an inactivated flu vaccine, which does not contain any  live influenza virus. It is given by injection with a needle, and often called the "flu shot."  A different live, attenuated (weakened) influenza vaccine is sprayed into the nostrils. This vaccine is described in a separate Vaccine Information Statement. Flu vaccine is recommended every year. Children 6 months through 18 years of age should get 2 doses the first year they get vaccinated. Flu viruses are always changing. Each year's flu vaccine is made to protect from viruses that are most likely to cause disease that year. While flu vaccine cannot prevent all cases of flu, it is our best defense against the disease. Inactivated flu vaccine protects against 3 or 4 different influenza viruses. It takes about 2 weeks for protection to develop after the vaccination, and protection lasts several months to a year. Some illnesses that are not caused by influenza virus are often mistaken for flu. Flu vaccine will not prevent these illnesses. It can only prevent influenza. A "high-dose" flu vaccine is available for people 29 years of age and older. The person giving you the vaccine can tell you more about it. Some inactivated flu vaccine contains a very small amount of a mercury-based preservative called thimerosal. Studies have shown that thimerosal in vaccines is not harmful, but flu vaccines that do not contain a preservative are available. SOME PEOPLE SHOULD NOT GET THIS VACCINE Tell the person who gives you the vaccine:  If you have any severe (life-threatening) allergies. If you ever had a life-threatening allergic reaction after a dose of flu vaccine, or have a severe allergy to any part of this vaccine, you may be advised not to get a dose. Most, but not all, types of flu vaccine contain a small amount of egg.  If you ever had Guillain Barr Syndrome (a severe paralyzing illness, also called GBS). Some people with a history of GBS should not get this vaccine. This should be discussed with your  doctor.  If you are not feeling well. They might suggest waiting until you feel better. But you should come back. RISKS OF A VACCINE REACTION With a vaccine, like any medicine, there is a chance of side effects. These are usually mild and go away on their own. Serious side effects are also possible, but are very rare. Inactivated flu vaccine does not contain live flu virus, sogetting flu from this vaccine is not possible. Brief fainting spells and related symptoms (such as jerking movements) can happen after any medical procedure, including vaccination. Sitting or lying down for about 15 minutes after a vaccination can help prevent fainting and injuries caused by falls. Tell your doctor if you feel dizzy or lightheaded, or have vision changes or ringing in the ears. Mild  problems following inactivated flu vaccine:  Soreness, redness, or swelling where the shot was given.  Hoarseness; sore, red or itchy eyes; or cough.  Fever.  Aches.  Headache.  Itching.  Fatigue. If these problems occur, they usually begin soon after the shot and last 1 or 2 days. Moderate problems following inactivated flu vaccine:  Young children who get inactivated flu vaccine and pneumococcal vaccine (PCV13) at the same time may be at increased risk for seizures caused by fever. Ask your doctor for more information. Tell your doctor if a child who is getting flu vaccine has ever had a seizure. Severe problems following inactivated flu vaccine:  A severe allergic reaction could occur after any vaccine (estimated less than 1 in a million doses).  There is a small possibility that inactivated flu vaccine could be associated with Guillan Barr Syndrome (GBS), no more than 1 or 2 cases per million people vaccinated. This is much lower than the risk of severe complications from flu, which can be prevented by flu vaccine. The safety of vaccines is always being monitored. For more information, visit:  http://floyd.org/ WHAT IF THERE IS A SERIOUS REACTION? What should I look for?  Look for anything that concerns you, such as signs of a severe allergic reaction, very high fever, or behavior changes. Signs of a severe allergic reaction can include hives, swelling of the face and throat, difficulty breathing, a fast heartbeat, dizziness, and weakness. These would start a few minutes to a few hours after the vaccination. What should I do?  If you think it is a severe allergic reaction or other emergency that cannot wait, call [phone] 1 1  or get the person to the nearest hospital. Otherwise, call your doctor.  Afterward, the reaction should be reported to the Vaccine Adverse Event Reporting System (VAERS). Your doctor might file this report, or you can do it yourself through the VAERS website at www.vaers.LAgents.no, or by calling 1-551-076-9897. VAERS is only for reporting reactions. They do not give medical advice. THE NATIONAL VACCINE INJURY COMPENSATION PROGRAM The National Vaccine Injury Compensation Program (VICP) is a federal program that was created to compensate people who may have been injured by certain vaccines. Persons who believe they may have been injured by a vaccine can learn about the program and about filing a claim by calling 1-(385)245-0063 or visiting the VICP website at SpiritualWord.at HOW CAN I LEARN MORE?  Ask your doctor.  Call your local or state health department.  Contact the Centers for Disease Control and Prevention (CDC):  Call 952-398-7113 (1-800-CDC-INFO) or  Visit CDC's website at BiotechRoom.com.cy CDC Inactivated Influenza Vaccine Interim VIS (11/22/11) Document Released: 02/07/2006 Document Revised: 01/08/2012 Document Reviewed: 12/17/2011 [facility] Patient Information 2014 [place], [place].

## 2013-03-10 ENCOUNTER — Other Ambulatory Visit: Payer: Self-pay | Admitting: Cardiology

## 2013-03-11 LAB — CMP14+EGFR
AST: 16 IU/L (ref 0–40)
Alkaline Phosphatase: 82 IU/L (ref 39–117)
BUN/Creatinine Ratio: 23 — ABNORMAL HIGH (ref 10–22)
CO2: 26 mmol/L (ref 18–29)
Chloride: 98 mmol/L (ref 97–108)
Sodium: 140 mmol/L (ref 134–144)

## 2013-03-11 LAB — NMR, LIPOPROFILE
HDL Particle Number: 26.3 umol/L — ABNORMAL LOW (ref >=30.5)
LDL Particle Number: 993 nmol/L (ref <1000)
LDLC SERPL CALC-MCNC: 46 mg/dL (ref <100)
LP-IR Score: 81 — ABNORMAL HIGH (ref <=45)

## 2013-03-14 ENCOUNTER — Other Ambulatory Visit: Payer: Self-pay | Admitting: Cardiology

## 2013-03-24 ENCOUNTER — Other Ambulatory Visit: Payer: Self-pay | Admitting: Nurse Practitioner

## 2013-03-28 ENCOUNTER — Other Ambulatory Visit: Payer: Self-pay | Admitting: Nurse Practitioner

## 2013-04-04 ENCOUNTER — Other Ambulatory Visit: Payer: Self-pay | Admitting: Family Medicine

## 2013-05-07 ENCOUNTER — Other Ambulatory Visit: Payer: Self-pay | Admitting: Nurse Practitioner

## 2013-05-10 ENCOUNTER — Other Ambulatory Visit: Payer: Self-pay | Admitting: Nurse Practitioner

## 2013-06-11 ENCOUNTER — Other Ambulatory Visit: Payer: Self-pay | Admitting: Nurse Practitioner

## 2013-06-14 NOTE — Telephone Encounter (Signed)
Last seen 11/11/4  MMM

## 2013-06-16 ENCOUNTER — Other Ambulatory Visit: Payer: Self-pay | Admitting: Nurse Practitioner

## 2013-06-20 ENCOUNTER — Other Ambulatory Visit: Payer: Self-pay | Admitting: Nurse Practitioner

## 2013-06-20 ENCOUNTER — Other Ambulatory Visit (HOSPITAL_COMMUNITY): Payer: Self-pay | Admitting: Cardiology

## 2013-06-21 ENCOUNTER — Other Ambulatory Visit: Payer: Self-pay | Admitting: *Deleted

## 2013-06-21 MED ORDER — LORATADINE 10 MG PO TABS: 10.0000 mg | ORAL_TABLET | Freq: Every day | ORAL | Status: DC

## 2013-06-21 MED ORDER — LEVETIRACETAM 500 MG PO TABS: ORAL_TABLET | ORAL | Status: DC

## 2013-06-21 MED ORDER — CLONIDINE HCL 0.1 MG PO TABS: ORAL_TABLET | ORAL | Status: DC

## 2013-06-21 MED ORDER — ATORVASTATIN CALCIUM 20 MG PO TABS: ORAL_TABLET | ORAL | Status: DC

## 2013-06-21 MED ORDER — OXYBUTYNIN CHLORIDE ER 5 MG PO TB24: ORAL_TABLET | ORAL | Status: DC

## 2013-06-21 MED ORDER — VITAMIN D (ERGOCALCIFEROL) 1.25 MG (50000 UNIT) PO CAPS: ORAL_CAPSULE | ORAL | Status: DC

## 2013-06-25 ENCOUNTER — Other Ambulatory Visit: Payer: Self-pay | Admitting: Nurse Practitioner

## 2013-06-28 ENCOUNTER — Other Ambulatory Visit: Payer: Self-pay

## 2013-06-28 DIAGNOSIS — I4891 Unspecified atrial fibrillation: Secondary | ICD-10-CM

## 2013-06-28 MED ORDER — POTASSIUM CHLORIDE CRYS ER 20 MEQ PO TBCR: EXTENDED_RELEASE_TABLET | ORAL | Status: DC

## 2013-06-28 MED ORDER — LISINOPRIL 5 MG PO TABS: 5.0000 mg | ORAL_TABLET | Freq: Every day | ORAL | Status: DC

## 2013-06-28 MED ORDER — FUROSEMIDE 40 MG PO TABS: ORAL_TABLET | ORAL | Status: DC

## 2013-06-30 ENCOUNTER — Other Ambulatory Visit: Payer: Self-pay | Admitting: Nurse Practitioner

## 2013-07-14 ENCOUNTER — Encounter: Payer: Self-pay | Admitting: Cardiology

## 2013-07-14 ENCOUNTER — Ambulatory Visit (INDEPENDENT_AMBULATORY_CARE_PROVIDER_SITE_OTHER): Payer: Medicare Other | Admitting: Cardiology

## 2013-07-14 VITALS — BP 135/78 | HR 55 | Ht 69.0 in | Wt 220.0 lb

## 2013-07-14 DIAGNOSIS — I4891 Unspecified atrial fibrillation: Secondary | ICD-10-CM

## 2013-07-14 DIAGNOSIS — I2510 Atherosclerotic heart disease of native coronary artery without angina pectoris: Secondary | ICD-10-CM

## 2013-07-14 NOTE — Progress Notes (Signed)
HPI The patient presents for evaluation of coronary disease and atrial fibrillation. He has a history of coronary disease with CABG. He has an ischemic cardiomyopathy with an EF of about 25%.  Since I last saw him he has had no acute shortness of breath, PND or orthopnea. He has had no new chest discomfort. He's not reporting any presyncope or syncope.  He is minimally active at home but in no distress with this activity.  Have been treating him with Eliquis given his excessive risk to thromboembolic stroke with his atrial fibrillation. He actually has been tolerating this well. He's not been having any palpitations, presyncope or syncope. There's been no new shortness of breath, PND or orthopnea.  No Known Allergies  Current Outpatient Prescriptions  Medication Sig Dispense Refill  . atorvastatin (LIPITOR) 20 MG tablet TAKE 1 TABLET (20 MG TOTAL) BY MOUTH DAILY.  90 tablet  0  . cloNIDine (CATAPRES) 0.1 MG tablet TAKE 1 TABLET BY MOUTH ONCE DAILY  90 tablet  0  . ELIQUIS 2.5 MG TABS tablet TAKE 1 TABLET (2.5 MG TOTAL) BY MOUTH 2 (TWO) TIMES DAILY.  60 tablet  6  . furosemide (LASIX) 40 MG tablet TAKE 1 TABLET (40 MG TOTAL) BY MOUTH 2 (TWO) TIMES DAILY.  180 tablet  0  . LANTUS 100 UNIT/ML injection INJECT 40 UNITS AT BEDTIME  4 vial  2  . levETIRAcetam (KEPPRA) 500 MG tablet TAKE 1 TABLET (500 MG TOTAL) BY MOUTH 2 (TWO) TIMES DAILY.  180 tablet  0  . lisinopril (PRINIVIL,ZESTRIL) 5 MG tablet Take 1 tablet (5 mg total) by mouth daily.  90 tablet  1  . loratadine (CLARITIN) 10 MG tablet Take 1 tablet (10 mg total) by mouth daily.  90 tablet  0  . oxybutynin (DITROPAN-XL) 5 MG 24 hr tablet TAKE 1 TABLET (5 MG TOTAL) BY MOUTH DAILY.  90 tablet  0  . potassium chloride SA (KLOR-CON M20) 20 MEQ tablet TAKE 1 TABLET (20 MEQ TOTAL) BY MOUTH DAILY.  90 tablet  0  . Vitamin D, Ergocalciferol, (DRISDOL) 50000 UNITS CAPS capsule TAKE 1 CAPSULE BY MOUTH EVERY WEEK  12 capsule  0  . B-D INS SYR ULTRAFINE  1CC/31G 31G X 5/16" 1 ML MISC 40 UNITS EVERY DAY  10 each  1  . glucose blood (ONE TOUCH ULTRA TEST) test strip Test 2X a day and prn  Dx 250.02  100 each  12   No current facility-administered medications for this visit.    Past Medical History  Diagnosis Date  . CAD (coronary artery disease)     a. 1997 s/p CABG x 4: LIMA->LAD, VG->Diag, VG->OM, VG->dRCA;  b. 07/2000 Cath: LM nl, LAD 2m, RI 99p, LCX 100p, RCA 90, VG->RCA nl, VG->OM nl, VG->Diag nl, LIMA->LAD nl.  . Diabetes   . Chronic systolic CHF (congestive heart failure)     a. 07/2012 Echo: EF 25-30%, diff HK, triv AI, mild MR, mod dil LA, mod reduced RV syst fxn, mildly dil RA.  . Cardiomyopathy, ischemic   . Obesity   . HTN (hypertension), benign   . Hyperlipidemia   . Subdural hematoma     a. 2009.  Marland Kitchen OSA (obstructive sleep apnea)     a. uses CPAP  . Atrial fibrillation     a. Eliquis started 06/2012.    Past Surgical History  Procedure Laterality Date  . Coronary artery bypass graft      1997 L - LAD, SVG D1,  SVG OM, SVG RCA.  Grafts patent on cath 2002  . Hemorrhoid surgery    . Cholecystectomy    . Bilateral total knee replacements    . Nasal septum surgery      ROS:  As stated in the HPI and negative for all other systems.  PHYSICAL EXAM BP 135/78  Pulse 55  Ht 5\' 9"  (1.753 m)  Wt 220 lb (99.791 kg)  BMI 32.47 kg/m2 GENERAL:  Frail appearing, but no distress NECK:  No jugular venous distention, waveform within normal limits, carotid upstroke brisk and symmetric, no bruits, no thyromegaly LUNGS:  Clear to auscultation bilaterally BACK:  No CVA tenderness HEART:  PMI not displaced or sustained,S1 and S2 within normal limits, no S3,  no clicks, no rubs, no murmurs, irreugular ABD:  Flat, positive bowel sounds normal in frequency in pitch, no bruits, no rebound, no guarding, no midline pulsatile mass, no hepatomegaly, no splenomegaly EXT:  2 plus pulses throughout, no edema, no cyanosis no clubbing, dependent  rubor SKIN:  No rashes no nodules  EKG:  Atrial flutter her rate 75, premature ectopic complexes, no acute ST-T wave changes.  07/14/2013  ASSESSMENT AND PLAN  ISCHEMIC CARDIOMYOPATHY:   He seems to be euvolemic and  I will suggest no change in the therapy.  ATRIAL FIBRILLATION:  The patient tolerates this rhythm  He has no contraindication to anticoagulation.  Taylor Jacobson has a CHA2DS2 - VASc score of 6 with a risk of stroke of 9.8%  and a HAS - BLED score of 2 with a low bleeding risk.  He will remain on the Eliquis.    CAD:  He has no acute symptoms. He will continue with risk reduction.    HTN:  His daughter asked me for a refill on clonidine because he has been out of it for a few weeks and they have not been able to get a refill. However, since his blood pressure is doing well without it I would suggest that he just stay off of it.

## 2013-07-14 NOTE — Patient Instructions (Signed)
Please stop Clonidine, continue all other medications as listed  Follow up in 1 year with Dr Antoine PocheHochrein.  You will receive a letter in the mail 2 months before you are due.  Please call us when you receive this letter to schedule your follow up appointment.

## 2013-07-30 ENCOUNTER — Other Ambulatory Visit: Payer: Self-pay | Admitting: Nurse Practitioner

## 2013-08-27 ENCOUNTER — Other Ambulatory Visit: Payer: Self-pay | Admitting: Nurse Practitioner

## 2013-08-31 ENCOUNTER — Other Ambulatory Visit: Payer: Self-pay | Admitting: Nurse Practitioner

## 2013-09-01 NOTE — Telephone Encounter (Signed)
Patient NTBS for follow up and lab work  

## 2013-09-01 NOTE — Telephone Encounter (Signed)
Patient last seen in office on 03-09-13. Was to follow up in 3 months. Please advise on refills

## 2013-09-07 ENCOUNTER — Other Ambulatory Visit: Payer: Self-pay | Admitting: Cardiology

## 2013-10-13 ENCOUNTER — Other Ambulatory Visit: Payer: Self-pay | Admitting: Nurse Practitioner

## 2013-11-02 ENCOUNTER — Other Ambulatory Visit: Payer: Self-pay | Admitting: Nurse Practitioner

## 2013-11-04 NOTE — Telephone Encounter (Signed)
Last ov and AIC 10/14.

## 2013-11-04 NOTE — Telephone Encounter (Signed)
Patient NTBS for follow up and lab work  

## 2013-11-12 ENCOUNTER — Other Ambulatory Visit: Payer: Self-pay | Admitting: Nurse Practitioner

## 2013-11-12 NOTE — Telephone Encounter (Signed)
Last OV 11-14. Has upcoming appt for August. Please advise on 90 day refills

## 2013-11-18 ENCOUNTER — Other Ambulatory Visit: Payer: Self-pay | Admitting: Cardiology

## 2013-11-23 ENCOUNTER — Telehealth: Payer: Self-pay | Admitting: Family Medicine

## 2013-11-23 MED ORDER — GLUCOSE BLOOD VI STRP: ORAL_STRIP | Status: DC

## 2013-11-23 NOTE — Telephone Encounter (Signed)
done

## 2013-11-25 ENCOUNTER — Telehealth: Payer: Self-pay | Admitting: *Deleted

## 2013-11-25 ENCOUNTER — Other Ambulatory Visit: Payer: Self-pay

## 2013-11-25 MED ORDER — GLUCOSE BLOOD VI STRP: ORAL_STRIP | Status: DC

## 2013-11-25 NOTE — Telephone Encounter (Signed)
done

## 2013-11-29 ENCOUNTER — Encounter: Payer: Self-pay | Admitting: Nurse Practitioner

## 2013-11-29 ENCOUNTER — Ambulatory Visit (INDEPENDENT_AMBULATORY_CARE_PROVIDER_SITE_OTHER): Payer: Medicare Other | Admitting: Nurse Practitioner

## 2013-11-29 VITALS — BP 124/83 | HR 43 | Temp 96.5°F | Ht 69.0 in | Wt 237.0 lb

## 2013-11-29 DIAGNOSIS — E669 Obesity, unspecified: Secondary | ICD-10-CM

## 2013-11-29 DIAGNOSIS — I10 Essential (primary) hypertension: Secondary | ICD-10-CM

## 2013-11-29 DIAGNOSIS — Z713 Dietary counseling and surveillance: Secondary | ICD-10-CM

## 2013-11-29 DIAGNOSIS — I509 Heart failure, unspecified: Secondary | ICD-10-CM

## 2013-11-29 DIAGNOSIS — I5042 Chronic combined systolic (congestive) and diastolic (congestive) heart failure: Secondary | ICD-10-CM

## 2013-11-29 DIAGNOSIS — I482 Chronic atrial fibrillation, unspecified: Secondary | ICD-10-CM

## 2013-11-29 DIAGNOSIS — E119 Type 2 diabetes mellitus without complications: Secondary | ICD-10-CM

## 2013-11-29 DIAGNOSIS — Z6835 Body mass index (BMI) 35.0-35.9, adult: Secondary | ICD-10-CM

## 2013-11-29 DIAGNOSIS — G4733 Obstructive sleep apnea (adult) (pediatric): Secondary | ICD-10-CM

## 2013-11-29 DIAGNOSIS — I4891 Unspecified atrial fibrillation: Secondary | ICD-10-CM

## 2013-11-29 DIAGNOSIS — E785 Hyperlipidemia, unspecified: Secondary | ICD-10-CM

## 2013-11-29 LAB — POCT GLYCOSYLATED HEMOGLOBIN (HGB A1C): HEMOGLOBIN A1C: 7.1

## 2013-11-29 MED ORDER — FUROSEMIDE 40 MG PO TABS: ORAL_TABLET | ORAL | Status: DC

## 2013-11-29 NOTE — Patient Instructions (Signed)

## 2013-11-29 NOTE — Progress Notes (Signed)
Subjective:    Patient ID: Taylor Jacobson, male    DOB: 1926-11-17, 80 y.o.   MRN: [id number]  HPI patient in today with wife and daughter- they are very concerned about his weight gain- Daughter says that the scales fluctate from day to day. He has a history of CHF and sometime sthe fluid pills seem to be working and other times they are not. His weight seems to fluctuate from 223- 229. He stays SOB and is on O2 via [place] continuaously.  Patient Active Problem List   Diagnosis Date Noted  . Thrombocytopenia, unspecified 08/27/2012  . Acute renal failure 08/27/2012  . Hypoglycemia associated with diabetes 08/27/2012  . Bradycardia 08/27/2012  . Atrial fibrillation 08/26/2012  . Heart failure, acute systolic, first episode 62/83/1517  . CAD (coronary artery disease)   . Cardiomyopathy, ischemic   . Obesity   . Hyperlipidemia   . history of HEMORRHAGE, SUBDURAL 07/05/2008  . DIVERTICULOSIS, COLON 09/14/2007  . HEMORRHOIDS, HX OF 09/14/2007  . OBESITY 06/21/2007  . HYPERTENSION 06/21/2007  . CEREBRAL ATROPHY 06/09/2007  . CORONARY ARTERY DISEASE 06/09/2007  . DEGENERATIVE JOINT DISEASE 06/09/2007  . DIABETES MELLITUS 06/08/2007  . OBSTRUCTIVE SLEEP APNEA 06/08/2007  . CARDIAC ARRHYTHMIA 06/08/2007  . SINUSITIS 06/08/2007  . ALLERGIC RHINITIS 06/08/2007  . COLONIC POLYPS, HX OF 06/08/2007   Outpatient Encounter Prescriptions as of 11/29/2013  Medication Sig  . atorvastatin (LIPITOR) 20 MG tablet TAKE 1 TABLET DAILY  . ELIQUIS 2.5 MG TABS tablet TAKE 1 TABLET TWICE A DAY  . furosemide (LASIX) 40 MG tablet TAKE 1 TABLET (40 MG) TWICE A DAY  . glucose blood test strip FreeStyle Freedom Lite Test Strips. Test Blood Sugar BID  DX:250.01  . Insulin Syringe-Needle U-100 (B-D INS SYR ULTRAFINE 1CC/31G) 31G X 5/16" 1 ML MISC Use daily  . KEPPRA 500 MG tablet TAKE 1 TABLET TWICE A DAY  . KLOR-CON M20 20 MEQ tablet TAKE 1 TABLET (20 MEQ) DAILY  . LANTUS 100 UNIT/ML injection INJECT 40  UNITS AT BEDTIME  . lisinopril (PRINIVIL,ZESTRIL) 5 MG tablet TAKE 1 TABLET (5 MG) DAILY  . loratadine (CLARITIN) 10 MG tablet TAKE 1 TABLET DAILY  . oxybutynin (DITROPAN-XL) 5 MG 24 hr tablet TAKE 1 TABLET DAILY  . Vitamin D, Ergocalciferol, (DRISDOL) 50000 UNITS CAPS capsule TAKE 1 CAPSULE EVERY WEEK         Review of Systems  Constitutional: Negative.   HENT: Negative.   Respiratory: Negative.   Cardiovascular: Negative.   Neurological: Negative.   Psychiatric/Behavioral: Negative.   All other systems reviewed and are negative.      Objective:   Physical Exam  Constitutional: He is oriented to person, place, and time. He appears well-developed and well-nourished.  HENT:  Head: Normocephalic.  Right Ear: External ear normal.  Left Ear: External ear normal.  Nose: Nose normal.  Mouth/Throat: Oropharynx is clear and moist.  Eyes: EOM are normal. Pupils are equal, round, and reactive to light.  Neck: Normal range of motion. Neck supple. No JVD present. No thyromegaly present.  Cardiovascular: Normal rate, regular rhythm, normal heart sounds and intact distal pulses.  Exam reveals no gallop and no friction rub.   No murmur heard. Pulmonary/Chest: Effort normal. No respiratory distress. He has no wheezes. He has rales (fine in bil bases). He exhibits no tenderness.  Abdominal: Soft. Bowel sounds are normal. He exhibits no mass. There is no tenderness.  Musculoskeletal: Normal range of motion. He exhibits no edema.  Lymphadenopathy:    He has no cervical adenopathy.  Neurological: He is alert and oriented to person, place, and time. No cranial nerve deficit.  Skin: Skin is warm and dry.  Psychiatric: He has a normal mood and affect. His behavior is normal. Judgment and thought content normal.   BP 124/83  Pulse 43  Temp(Src) 96.5 F (35.8 C) (Oral)  Ht _0  (1.753 m)  Wt 237 lb (107.502 kg)  BMI 34.98 kg/m2  Results for orders placed in visit on 11/29/13  POCT  GLYCOSYLATED HEMOGLOBIN (HGB A1C)      Result Value Ref Range   Hemoglobin A1C 7.1           Assessment & Plan:   1. DIABETES MELLITUS   2. HYPERTENSION   3. Hyperlipidemia   4. OBESITY   5. OBSTRUCTIVE SLEEP APNEA   6. Chronic atrial fibrillation   7. Chronic combined systolic and diastolic congestive heart failure   8. BMI 35.0-35.9,adult   9. Weight loss counseling, encounter for    Orders Placed This Encounter  Procedures  . CMP14+EGFR  . NMR, lipoprofile  . Brain natriuretic peptide  . POCT glycosylated hemoglobin (Hb A1C)   Meds ordered this encounter  Medications  . furosemide (LASIX) 40 MG tablet    Sig: 2 po q am and 1 po in the afternoon    Dispense:  180 tablet    Refill:  1    Order Specific Question:  Supervising Provider    Answer:  Chipper Herb [[id number]]   Increased daily dose of lasix- continue daily weights Labs pending Health maintenance reviewed Diet and exercise encouraged Continue all meds Follow up  In 3 months   [place], FNP

## 2013-11-30 LAB — CMP14+EGFR
ALBUMIN: 4 g/dL (ref 3.5–4.7)
ALT: 10 IU/L (ref 0–44)
AST: 15 IU/L (ref 0–40)
Albumin/Globulin Ratio: 1.3 (ref 1.1–2.5)
Alkaline Phosphatase: 77 IU/L (ref 39–117)
BILIRUBIN TOTAL: 0.4 mg/dL (ref 0.0–1.2)
BUN/Creatinine Ratio: 21 (ref 10–22)
BUN: 46 mg/dL — ABNORMAL HIGH (ref 8–27)
CALCIUM: 8.7 mg/dL (ref 8.6–10.2)
CO2: 20 mmol/L (ref 18–29)
CREATININE: 2.16 mg/dL — AB (ref 0.76–1.27)
Chloride: 100 mmol/L (ref 97–108)
GFR, EST AFRICAN AMERICAN: 31 mL/min/{1.73_m2} — AB (ref >59)
GFR, EST NON AFRICAN AMERICAN: 27 mL/min/{1.73_m2} — AB (ref >59)
GLOBULIN, TOTAL: 3.1 g/dL (ref 1.5–4.5)
GLUCOSE: 252 mg/dL — AB (ref 65–99)
POTASSIUM: 4.4 mmol/L (ref 3.5–5.2)
Sodium: 142 mmol/L (ref 134–144)
TOTAL PROTEIN: 7.1 g/dL (ref 6.0–8.5)

## 2013-11-30 LAB — NMR, LIPOPROFILE
CHOLESTEROL: 135 mg/dL (ref 100–199)
HDL CHOLESTEROL BY NMR: 38 mg/dL — AB (ref >39)
HDL PARTICLE NUMBER: 22.5 umol/L — AB (ref >=30.5)
LDL Particle Number: 1012 nmol/L — ABNORMAL HIGH (ref <1000)
LDL SIZE: 20.5 nm (ref >20.5)
LDLC SERPL CALC-MCNC: 74 mg/dL (ref 0–99)
LP-IR Score: 81 — ABNORMAL HIGH (ref <=45)
Small LDL Particle Number: 598 nmol/L — ABNORMAL HIGH (ref <=527)
Triglycerides by NMR: 114 mg/dL (ref 0–149)

## 2013-11-30 LAB — BRAIN NATRIURETIC PEPTIDE: BNP: 98 pg/mL (ref 0.0–100.0)

## 2013-12-01 ENCOUNTER — Telehealth: Payer: Self-pay | Admitting: Family Medicine

## 2013-12-01 NOTE — Telephone Encounter (Signed)
Message copied by Azalee CourseFULP, ASHLEY on Wed Dec 01, 2013 11:23 AM ------      Message from: Bennie PieriniMARTIN, MARY-MARGARET      Created: Wed Dec 01, 2013 11:10 AM       MUST GIVE RESULTS TO DAUGHTER!            BNP normal- CHF under control      Hgba1c discussed at appointment      Kidney and liver function stable- creatine still elevated- avoid NSAIDS      Cholesterol looks great      Continue current meds- low fat diet and exercise and recheck in 3 months             ------

## 2013-12-05 ENCOUNTER — Emergency Department (HOSPITAL_COMMUNITY)
Admission: EM | Admit: 2013-12-05 | Discharge: 2013-12-05 | Disposition: A | Discharge status: Home / Self Care | Payer: Medicare Other | Attending: Emergency Medicine | Admitting: Emergency Medicine

## 2013-12-05 ENCOUNTER — Encounter (HOSPITAL_COMMUNITY): Payer: Self-pay | Admitting: Emergency Medicine

## 2013-12-05 ENCOUNTER — Emergency Department (HOSPITAL_COMMUNITY): Payer: Medicare Other

## 2013-12-05 DIAGNOSIS — Z794 Long term (current) use of insulin: Secondary | ICD-10-CM | POA: Diagnosis not present

## 2013-12-05 DIAGNOSIS — Z87891 Personal history of nicotine dependence: Secondary | ICD-10-CM | POA: Diagnosis not present

## 2013-12-05 DIAGNOSIS — Z7902 Long term (current) use of antithrombotics/antiplatelets: Secondary | ICD-10-CM | POA: Diagnosis not present

## 2013-12-05 DIAGNOSIS — Z951 Presence of aortocoronary bypass graft: Secondary | ICD-10-CM | POA: Diagnosis not present

## 2013-12-05 DIAGNOSIS — I5022 Chronic systolic (congestive) heart failure: Secondary | ICD-10-CM | POA: Insufficient documentation

## 2013-12-05 DIAGNOSIS — I10 Essential (primary) hypertension: Secondary | ICD-10-CM | POA: Insufficient documentation

## 2013-12-05 DIAGNOSIS — L03119 Cellulitis of unspecified part of limb: Secondary | ICD-10-CM | POA: Diagnosis not present

## 2013-12-05 DIAGNOSIS — E119 Type 2 diabetes mellitus without complications: Secondary | ICD-10-CM | POA: Diagnosis not present

## 2013-12-05 DIAGNOSIS — L02519 Cutaneous abscess of unspecified hand: Secondary | ICD-10-CM | POA: Diagnosis not present

## 2013-12-05 DIAGNOSIS — M79609 Pain in unspecified limb: Secondary | ICD-10-CM | POA: Diagnosis present

## 2013-12-05 DIAGNOSIS — Z9981 Dependence on supplemental oxygen: Secondary | ICD-10-CM | POA: Insufficient documentation

## 2013-12-05 DIAGNOSIS — E669 Obesity, unspecified: Secondary | ICD-10-CM | POA: Insufficient documentation

## 2013-12-05 DIAGNOSIS — Z79899 Other long term (current) drug therapy: Secondary | ICD-10-CM | POA: Insufficient documentation

## 2013-12-05 DIAGNOSIS — E785 Hyperlipidemia, unspecified: Secondary | ICD-10-CM | POA: Insufficient documentation

## 2013-12-05 DIAGNOSIS — I4891 Unspecified atrial fibrillation: Secondary | ICD-10-CM | POA: Insufficient documentation

## 2013-12-05 DIAGNOSIS — L03113 Cellulitis of right upper limb: Secondary | ICD-10-CM

## 2013-12-05 DIAGNOSIS — G4733 Obstructive sleep apnea (adult) (pediatric): Secondary | ICD-10-CM | POA: Insufficient documentation

## 2013-12-05 DIAGNOSIS — I2510 Atherosclerotic heart disease of native coronary artery without angina pectoris: Secondary | ICD-10-CM | POA: Insufficient documentation

## 2013-12-05 HISTORY — DX: Unspecified atrial fibrillation: I48.91

## 2013-12-05 LAB — CBG MONITORING, ED: Glucose-Capillary: 177 mg/dL — ABNORMAL HIGH (ref 70–99)

## 2013-12-05 MED ORDER — LIDOCAINE HCL (PF) 1 % IJ SOLN
INTRAMUSCULAR | Status: AC
  Administered 2013-12-05: 15:00:00
  Filled 2013-12-05: qty 5

## 2013-12-05 MED ORDER — CEFTRIAXONE SODIUM 1 G IJ SOLR
1.0000 g | Freq: Once | INTRAMUSCULAR | Status: AC
  Administered 2013-12-05: 1 g via INTRAMUSCULAR
  Filled 2013-12-05: qty 10

## 2013-12-05 MED ORDER — CEPHALEXIN 500 MG PO CAPS: 500.0000 mg | ORAL_CAPSULE | Freq: Three times a day (TID) | ORAL | Status: DC

## 2013-12-05 NOTE — ED Notes (Signed)
Patient c/o pain to right hand. Patient hand red, swollen, warm and tender to touch. Per family started on Friday. Denies any known injury. Denies any fevers. Patient is diabetic.

## 2013-12-05 NOTE — Discharge Instructions (Signed)
Keflex as prescribed.  Return to the emergency department if your symptoms substantially worsen or do not improve over the next 24-48 hours, or if you develop any new and concerning symptoms.   Cellulitis Cellulitis is an infection of the skin and the tissue beneath it. The infected area is usually red and tender. Cellulitis occurs most often in the arms and lower legs.  CAUSES  Cellulitis is caused by bacteria that enter the skin through cracks or cuts in the skin. The most common types of bacteria that cause cellulitis are staphylococci and streptococci. SIGNS AND SYMPTOMS   Redness and warmth.  Swelling.  Tenderness or pain.  Fever. DIAGNOSIS  Your health care provider can usually determine what is wrong based on a physical exam. Blood tests may also be done. TREATMENT  Treatment usually involves taking an antibiotic medicine. HOME CARE INSTRUCTIONS   Take your antibiotic medicine as directed by your health care provider. Finish the antibiotic even if you start to feel better.  Keep the infected arm or leg elevated to reduce swelling.  Apply a warm cloth to the affected area up to 4 times per day to relieve pain.  Take medicines only as directed by your health care provider.  Keep all follow-up visits as directed by your health care provider. SEEK MEDICAL CARE IF:   You notice red streaks coming from the infected area.  Your red area gets larger or turns dark in color.  Your bone or joint underneath the infected area becomes painful after the skin has healed.  Your infection returns in the same area or another area.  You notice a swollen bump in the infected area.  You develop new symptoms.  You have a fever. SEEK IMMEDIATE MEDICAL CARE IF:   You feel very sleepy.  You develop vomiting or diarrhea.  You have a general ill feeling (malaise) with muscle aches and pains. MAKE SURE YOU:   Understand these instructions.  Will watch your condition.  Will get  help right away if you are not doing well or get worse. Document Released: 01/23/2005 Document Revised: 08/30/2013 Document Reviewed: 07/01/2011 [facility] Patient Information 2015 [place], [place]. This information is not intended to replace advice given to you by your health care provider. Make sure you discuss any questions you have with your health care provider.

## 2013-12-05 NOTE — ED Provider Notes (Signed)
CSN: [id number]     Arrival date & time 12/05/13  1253 History  This chart was scribed for Taylor Lyons, MD by Leone Payor, ED Scribe. This patient was seen in room APA18/AP[facility]18 and the patient's care was started 2:41 PM.    Chief Complaint  Patient presents with  . Hand Pain   The history is provided by the patient. No language interpreter was used.    HPI Comments: Taylor Jacobson is a 80 y.o. male with past medical history of A-fib, HLD, HTN, CHF, DM who presents to the Emergency Department complaining of gradual onset, constant, gradually worsening right hand and wrist swelling with associated redness and pain that began 2 days ago. Patient denies known injury or trauma. Patient states he is able to move his fingers which mildly aggravates the pain. He denies fever.   Past Medical History  Diagnosis Date  . CAD (coronary artery disease)     a. 1997 s/p CABG x 4: LIMA->LAD, VG->Diag, VG->OM, VG->dRCA;  b. 07/2000 Cath: LM nl, LAD 13m, RI 99p, LCX 100p, RCA 90, VG->RCA nl, VG->OM nl, VG->Diag nl, LIMA->LAD nl.  . Diabetes   . Chronic systolic CHF (congestive heart failure)     a. 07/2012 Echo: EF 25-30%, diff HK, triv AI, mild MR, mod dil LA, mod reduced RV syst fxn, mildly dil RA.  . Cardiomyopathy, ischemic   . Obesity   . HTN (hypertension), benign   . Hyperlipidemia   . Subdural hematoma     a. 2009.  Marland Kitchen OSA (obstructive sleep apnea)     a. uses CPAP  . Atrial fibrillation     a. Eliquis started 06/2012.  Marland Kitchen A-fib    Past Surgical History  Procedure Laterality Date  . Coronary artery bypass graft      1997 L - LAD, SVG D1, SVG OM, SVG RCA.  Grafts patent on cath 2002  . Hemorrhoid surgery    . Cholecystectomy    . Bilateral total knee replacements    . Nasal septum surgery     Family History  Problem Relation Age of Onset  . Other Mother     died during childbirth.  . Stroke Father    History  Substance Use Topics  . Smoking status: Former Smoker -- 0.04 packs/day for  .5 years    Types: Cigarettes  . Smokeless tobacco: Never Used     Comment: Distant past  . Alcohol Use: No    Review of Systems  A complete 10 system review of systems was obtained and all systems are negative except as noted in the HPI and PMH.    Allergies  Review of patient's allergies indicates no known allergies.  Home Medications   Prior to Admission medications   Medication Sig Start Date End Date Taking? Authorizing Provider  atorvastatin (LIPITOR) 20 MG tablet TAKE 1 TABLET DAILY 11/12/13   Mary-Margaret Daphine Deutscher, FNP  ELIQUIS 2.5 MG TABS tablet TAKE 1 TABLET TWICE A DAY    Mary-Margaret Daphine Deutscher, FNP  furosemide (LASIX) 40 MG tablet 2 po q am and 1 po in the afternoon 11/29/13   Mary-Margaret Daphine Deutscher, FNP  glucose blood test strip FreeStyle Freedom Lite Test Strips. Test Blood Sugar BID  DX:250.01 11/25/13   Mary-Margaret Daphine Deutscher, FNP  Insulin Syringe-Needle U-100 (B-D INS SYR ULTRAFINE 1CC/31G) 31G X 5/16" 1 ML MISC Use daily 11/04/13   Mary-Margaret Daphine Deutscher, FNP  KEPPRA 500 MG tablet TAKE 1 TABLET TWICE A DAY 11/12/13   Mary-Margaret Daphine Deutscher,  FNP  KLOR-CON M20 20 MEQ tablet TAKE 1 TABLET (20 MEQ) DAILY    Rollene RotundaJames Hochrein, MD  LANTUS 100 UNIT/ML injection INJECT 40 UNITS AT BEDTIME 03/28/13   Mary-Margaret Daphine DeutscherMartin, FNP  lisinopril (PRINIVIL,ZESTRIL) 5 MG tablet TAKE 1 TABLET (5 MG) DAILY 11/18/13   Rollene RotundaJames Hochrein, MD  loratadine (CLARITIN) 10 MG tablet TAKE 1 TABLET DAILY 11/12/13   Mary-Margaret Daphine DeutscherMartin, FNP  oxybutynin (DITROPAN-XL) 5 MG 24 hr tablet TAKE 1 TABLET DAILY 11/12/13   Mary-Margaret Daphine DeutscherMartin, FNP  Vitamin D, Ergocalciferol, (DRISDOL) 50000 UNITS CAPS capsule TAKE 1 CAPSULE EVERY WEEK    Mary-Margaret Daphine DeutscherMartin, FNP   BP 103/54  Pulse 87  Temp(Src) 97.8 F (36.6 C) (Oral)  Resp 24  Ht 5\' 9"  (1.753 m)  Wt 230 lb (104.327 kg)  BMI 33.95 kg/m2  SpO2 100% Physical Exam  Nursing note and vitals reviewed. Constitutional: He is oriented to person, place, and time. He appears  well-developed and well-nourished.  HENT:  Head: Normocephalic and atraumatic.  Mouth/Throat: Oropharynx is clear and moist. No oropharyngeal exudate.  Eyes: Conjunctivae and EOM are normal. Pupils are equal, round, and reactive to light.  Neck: Normal range of motion.  Cardiovascular: Normal rate, regular rhythm and normal heart sounds.   Pulmonary/Chest: Effort normal and breath sounds normal. No respiratory distress. He has no wheezes. He has no rales.  Abdominal: Soft. He exhibits no distension. There is no tenderness.  Musculoskeletal: Normal range of motion.  Mild swelling and erythema to dorsum of right hand. Mild discomfort with finger extension. Distal cap refill and sensation intact.   Neurological: He is alert and oriented to person, place, and time.  Skin: Skin is warm and dry. There is erythema.  Psychiatric: He has a normal mood and affect.    ED Course  Procedures (including critical care time)  DIAGNOSTIC STUDIES: Oxygen Saturation is 100% on 2 L/min, normal by my interpretation.    COORDINATION OF CARE: 2:43 PM Discussed treatment plan with pt at bedside and pt agreed to plan.   Labs Review Labs Reviewed  CBG MONITORING, ED - Abnormal; Notable for the following:    Glucose-Capillary 177 (*)    All other components within normal limits    Imaging Review Dg Hand Complete Right  12/05/2013   CLINICAL DATA:  Proximal hand swelling. Redness for 3 days. No known injury.  EXAM: RIGHT HAND - COMPLETE 3+ VIEW  COMPARISON:  None.  FINDINGS: There is soft tissue swelling involving the distal interphalangeal joints, particularly the index finger. Degenerative changes are identified at the metacarpophalangeal joints and the first carpometacarpal joint. No evidence for acute fracture or dislocation. No radiopaque foreign body or soft tissue gas.  IMPRESSION: 1. Soft tissue swelling. 2. No evidence for acute fracture.   Electronically Signed   By: Rosalie GumsBeth  Brown M.D.   On: 12/05/2013  14:28     EKG Interpretation None      MDM   Final diagnoses:  None    Patient presents with redness and swelling to the right hand. This appears to be a cellulitis. He will be treated with IM ceftriaxone and by mouth Keflex. He understands to return if his symptoms substantially worsen or change.  I personally performed the services described in this documentation, which was scribed in my presence. The recorded information has been reviewed and is accurate.      Taylor Lyonsouglas Jahmeir Geisen, MD 12/05/13 [id number]

## 2013-12-05 NOTE — ED Notes (Signed)
Patient with no complaints at this time. Respirations even and unlabored. Skin warm/dry. Discharge instructions reviewed with patient at this time. Patient given opportunity to voice concerns/ask questions. Patient discharged at this time and left Emergency Department with steady gait.   

## 2013-12-26 ENCOUNTER — Other Ambulatory Visit: Payer: Self-pay | Admitting: Nurse Practitioner

## 2013-12-27 ENCOUNTER — Other Ambulatory Visit: Payer: Self-pay | Admitting: Nurse Practitioner

## 2013-12-28 ENCOUNTER — Ambulatory Visit (INDEPENDENT_AMBULATORY_CARE_PROVIDER_SITE_OTHER): Payer: Medicare Other | Admitting: Family Medicine

## 2013-12-28 ENCOUNTER — Encounter: Payer: Self-pay | Admitting: Family Medicine

## 2013-12-28 VITALS — BP 116/67 | HR 81 | Temp 97.1°F | Ht 69.0 in | Wt 230.0 lb

## 2013-12-28 DIAGNOSIS — L03011 Cellulitis of right finger: Secondary | ICD-10-CM

## 2013-12-28 DIAGNOSIS — L03019 Cellulitis of unspecified finger: Secondary | ICD-10-CM

## 2013-12-28 DIAGNOSIS — L02519 Cutaneous abscess of unspecified hand: Secondary | ICD-10-CM

## 2013-12-28 MED ORDER — CEFTRIAXONE SODIUM 1 G IJ SOLR
500.0000 mg | Freq: Once | INTRAMUSCULAR | Status: DC
Start: 2013-12-28 — End: 2014-03-17

## 2013-12-28 MED ORDER — DOXYCYCLINE HYCLATE 100 MG PO TABS: 100.0000 mg | ORAL_TABLET | Freq: Two times a day (BID) | ORAL | Status: DC

## 2013-12-28 MED ORDER — CEFTRIAXONE SODIUM 500 MG IJ SOLR
500.0000 mg | Freq: Once | INTRAMUSCULAR | Status: AC
Start: 2013-12-28 — End: 2013-12-28
  Administered 2013-12-28: 500 mg via INTRAMUSCULAR

## 2013-12-28 NOTE — Progress Notes (Signed)
   Subjective:    Patient ID: Taylor Jacobson, male    DOB: Jul 16, 1926, 80 y.o.   MRN: [id number]  HPI  C/o right hand discomfort and swelling.  Was seen in the ED on 12/02/13 for cellulitis and was tx'd with rocephin and keflex.  He states the infection returned over the last few days.  Review of Systems    No chest pain, SOB, HA, dizziness, vision change, N/V, diarrhea, constipation, dysuria, urinary urgency or frequency, myalgias, arthralgias or rash.  Objective:   Physical Exam  Vital signs noted  Well developed well nourished male.  HEENT - Head atraumatic Normocephalic Respiratory - Lungs CTA bilateral Cardiac - RRR S1 and S2 without murmur Skin - swelling and erythema right wrist.  Right wrist with warmth and tenderness.      Assessment & Plan:  Cellulitis of finger of right hand - Plan: doxycycline (VIBRA-TABS) 100 MG tablet Po bid x 10 days.  Rocephin  IM follow up in 4-7 days  Deatra Canter FNP

## 2013-12-28 NOTE — Addendum Note (Signed)
Addended by: Bearl Mulberry on: 12/28/2013 03:48 PM   Modules accepted: Orders

## 2014-01-04 ENCOUNTER — Encounter: Payer: Self-pay | Admitting: Family Medicine

## 2014-01-04 ENCOUNTER — Ambulatory Visit (INDEPENDENT_AMBULATORY_CARE_PROVIDER_SITE_OTHER): Payer: Medicare Other | Admitting: Family Medicine

## 2014-01-04 VITALS — BP 116/69 | HR 81 | Temp 97.4°F | Ht 69.0 in | Wt 229.0 lb

## 2014-01-04 DIAGNOSIS — L03113 Cellulitis of right upper limb: Secondary | ICD-10-CM

## 2014-01-04 DIAGNOSIS — IMO0002 Reserved for concepts with insufficient information to code with codable children: Secondary | ICD-10-CM

## 2014-01-04 NOTE — Progress Notes (Signed)
   Subjective:    Patient ID: Taylor Jacobson, male    DOB: [birth year], 80 y.o.   MRN: [id number]  HPI Follow up on cellulitis of the right hand.  He has been taking doxycycline  po bid for last 7 days and his cellulitis is resolved.   Review of Systems    No chest pain, SOB, HA, dizziness, vision change, N/V, diarrhea, constipation, dysuria, urinary urgency or frequency, myalgias, arthralgias or rash.  Objective:   Physical Exam    Right hand and wrist w/o cellulitis, swelling, or erythema    Assessment & Plan:   Cellulitis of right upper extremity - REsolved.  Deatra Canter FNP

## 2014-01-23 ENCOUNTER — Other Ambulatory Visit: Payer: Self-pay | Admitting: Nurse Practitioner

## 2014-01-30 ENCOUNTER — Other Ambulatory Visit: Payer: Self-pay | Admitting: Cardiology

## 2014-02-21 ENCOUNTER — Telehealth: Payer: Self-pay | Admitting: Family Medicine

## 2014-02-21 NOTE — Telephone Encounter (Signed)
appt made for Tuesday.  

## 2014-02-21 NOTE — Telephone Encounter (Signed)
Please advise 

## 2014-02-21 NOTE — Telephone Encounter (Signed)
Please follow up

## 2014-02-22 ENCOUNTER — Ambulatory Visit (INDEPENDENT_AMBULATORY_CARE_PROVIDER_SITE_OTHER): Payer: Medicare Other | Admitting: Family Medicine

## 2014-02-22 ENCOUNTER — Encounter: Payer: Self-pay | Admitting: Family Medicine

## 2014-02-22 VITALS — BP 125/83 | HR 80 | Temp 96.9°F | Ht 67.0 in | Wt 226.0 lb

## 2014-02-22 DIAGNOSIS — L03012 Cellulitis of left finger: Secondary | ICD-10-CM

## 2014-02-22 DIAGNOSIS — L03011 Cellulitis of right finger: Secondary | ICD-10-CM

## 2014-02-22 MED ORDER — DOXYCYCLINE HYCLATE 100 MG PO TABS: 100.0000 mg | ORAL_TABLET | Freq: Two times a day (BID) | ORAL | Status: DC

## 2014-02-22 MED ORDER — CEFTRIAXONE SODIUM 1 G IJ SOLR
1.0000 g | INTRAMUSCULAR | Status: AC
  Administered 2014-02-22: 1 g via INTRAMUSCULAR

## 2014-02-22 NOTE — Progress Notes (Signed)
   Subjective:    Patient ID: Taylor Jacobson, male    DOB: 1927/01/28, 80 y.o.   MRN: [id number]  HPI This 80 y.o. male presents for evaluation of redness and swelling in left hand.  He was tx'd with doxy and it responded well but it has returned.   Review of Systems    No chest pain, SOB, HA, dizziness, vision change, N/V, diarrhea, constipation, dysuria, urinary urgency or frequency, myalgias, arthralgias or rash.  Objective:   Physical Exam Vital signs noted  Well developed well nourished male.  HEENT - Head atraumatic Normocephalic                Eyes - PERRLA, Conjuctiva - clear Sclera- Clear EOMI                Ears - EAC's Wnl TM's Wnl Gross Hearing WNL                Nose - Nares patent                 Throat - oropharanx wnl Respiratory - Lungs CTA bilateral Cardiac - RRR S1 and S2 without murmur MS - Left hand with erythema and swelling      Assessment & Plan:  Cellulitis of finger of left hand - Plan: cefTRIAXone (ROCEPHIN) injection 1 g, doxycycline (VIBRA-TABS) 100 MG tablet  Deatra CanterWilliam J Oxford FNP

## 2014-03-11 ENCOUNTER — Inpatient Hospital Stay (HOSPITAL_COMMUNITY): Payer: Medicare Other

## 2014-03-11 ENCOUNTER — Encounter (HOSPITAL_COMMUNITY): Payer: Self-pay

## 2014-03-11 ENCOUNTER — Inpatient Hospital Stay (HOSPITAL_COMMUNITY)
Admission: EM | Admit: 2014-03-11 | Discharge: 2014-03-17 | DRG: 682 | Disposition: A | Discharge status: Skilled Nursing Facility | Payer: Medicare Other | Attending: Internal Medicine | Admitting: Internal Medicine

## 2014-03-11 ENCOUNTER — Ambulatory Visit (INDEPENDENT_AMBULATORY_CARE_PROVIDER_SITE_OTHER): Payer: Medicare Other | Admitting: Family Medicine

## 2014-03-11 ENCOUNTER — Ambulatory Visit (INDEPENDENT_AMBULATORY_CARE_PROVIDER_SITE_OTHER): Payer: Medicare Other

## 2014-03-11 ENCOUNTER — Emergency Department (HOSPITAL_COMMUNITY): Payer: Medicare Other

## 2014-03-11 VITALS — Ht 69.0 in

## 2014-03-11 DIAGNOSIS — Z66 Do not resuscitate: Secondary | ICD-10-CM | POA: Diagnosis present

## 2014-03-11 DIAGNOSIS — N183 Chronic kidney disease, stage 3 (moderate): Secondary | ICD-10-CM | POA: Diagnosis not present

## 2014-03-11 DIAGNOSIS — G4733 Obstructive sleep apnea (adult) (pediatric): Secondary | ICD-10-CM | POA: Diagnosis present

## 2014-03-11 DIAGNOSIS — G9340 Encephalopathy, unspecified: Secondary | ICD-10-CM | POA: Diagnosis not present

## 2014-03-11 DIAGNOSIS — R05 Cough: Secondary | ICD-10-CM

## 2014-03-11 DIAGNOSIS — Z951 Presence of aortocoronary bypass graft: Secondary | ICD-10-CM

## 2014-03-11 DIAGNOSIS — E871 Hypo-osmolality and hyponatremia: Secondary | ICD-10-CM | POA: Diagnosis not present

## 2014-03-11 DIAGNOSIS — Z7901 Long term (current) use of anticoagulants: Secondary | ICD-10-CM | POA: Diagnosis not present

## 2014-03-11 DIAGNOSIS — Z96653 Presence of artificial knee joint, bilateral: Secondary | ICD-10-CM | POA: Diagnosis present

## 2014-03-11 DIAGNOSIS — Z23 Encounter for immunization: Secondary | ICD-10-CM | POA: Diagnosis not present

## 2014-03-11 DIAGNOSIS — I4891 Unspecified atrial fibrillation: Secondary | ICD-10-CM | POA: Diagnosis not present

## 2014-03-11 DIAGNOSIS — R001 Bradycardia, unspecified: Secondary | ICD-10-CM | POA: Diagnosis not present

## 2014-03-11 DIAGNOSIS — I5022 Chronic systolic (congestive) heart failure: Secondary | ICD-10-CM | POA: Diagnosis present

## 2014-03-11 DIAGNOSIS — I255 Ischemic cardiomyopathy: Secondary | ICD-10-CM | POA: Diagnosis not present

## 2014-03-11 DIAGNOSIS — R0602 Shortness of breath: Secondary | ICD-10-CM

## 2014-03-11 DIAGNOSIS — N179 Acute kidney failure, unspecified: Secondary | ICD-10-CM | POA: Diagnosis present

## 2014-03-11 DIAGNOSIS — Z823 Family history of stroke: Secondary | ICD-10-CM | POA: Diagnosis not present

## 2014-03-11 DIAGNOSIS — I129 Hypertensive chronic kidney disease with stage 1 through stage 4 chronic kidney disease, or unspecified chronic kidney disease: Secondary | ICD-10-CM | POA: Diagnosis present

## 2014-03-11 DIAGNOSIS — E785 Hyperlipidemia, unspecified: Secondary | ICD-10-CM | POA: Diagnosis not present

## 2014-03-11 DIAGNOSIS — E119 Type 2 diabetes mellitus without complications: Secondary | ICD-10-CM | POA: Diagnosis not present

## 2014-03-11 DIAGNOSIS — R4182 Altered mental status, unspecified: Secondary | ICD-10-CM | POA: Diagnosis not present

## 2014-03-11 DIAGNOSIS — R059 Cough, unspecified: Secondary | ICD-10-CM

## 2014-03-11 DIAGNOSIS — Z87891 Personal history of nicotine dependence: Secondary | ICD-10-CM | POA: Diagnosis not present

## 2014-03-11 DIAGNOSIS — D649 Anemia, unspecified: Secondary | ICD-10-CM | POA: Diagnosis present

## 2014-03-11 DIAGNOSIS — Z6829 Body mass index (BMI) 29.0-29.9, adult: Secondary | ICD-10-CM | POA: Diagnosis not present

## 2014-03-11 DIAGNOSIS — I4892 Unspecified atrial flutter: Secondary | ICD-10-CM | POA: Diagnosis present

## 2014-03-11 DIAGNOSIS — F05 Delirium due to known physiological condition: Secondary | ICD-10-CM

## 2014-03-11 DIAGNOSIS — E1129 Type 2 diabetes mellitus with other diabetic kidney complication: Secondary | ICD-10-CM | POA: Diagnosis present

## 2014-03-11 DIAGNOSIS — E1169 Type 2 diabetes mellitus with other specified complication: Secondary | ICD-10-CM

## 2014-03-11 DIAGNOSIS — E669 Obesity, unspecified: Secondary | ICD-10-CM | POA: Diagnosis not present

## 2014-03-11 DIAGNOSIS — I2510 Atherosclerotic heart disease of native coronary artery without angina pectoris: Secondary | ICD-10-CM | POA: Diagnosis not present

## 2014-03-11 DIAGNOSIS — R339 Retention of urine, unspecified: Secondary | ICD-10-CM | POA: Diagnosis not present

## 2014-03-11 DIAGNOSIS — E860 Dehydration: Secondary | ICD-10-CM | POA: Diagnosis present

## 2014-03-11 LAB — BASIC METABOLIC PANEL
ANION GAP: 21 — AB (ref 5–15)
BUN: 137 mg/dL — AB (ref 6–23)
CO2: 14 mEq/L — ABNORMAL LOW (ref 19–32)
CREATININE: 4.63 mg/dL — AB (ref 0.50–1.35)
Calcium: 8.3 mg/dL — ABNORMAL LOW (ref 8.4–10.5)
Chloride: 106 mEq/L (ref 96–112)
GFR calc non Af Amer: 10 mL/min — ABNORMAL LOW (ref >90)
GFR, EST AFRICAN AMERICAN: 12 mL/min — AB (ref >90)
Glucose, Bld: 83 mg/dL (ref 70–99)
Potassium: 5.8 mEq/L — ABNORMAL HIGH (ref 3.7–5.3)
Sodium: 141 mEq/L (ref 137–147)

## 2014-03-11 LAB — URINALYSIS, ROUTINE W REFLEX MICROSCOPIC
BILIRUBIN URINE: NEGATIVE
Glucose, UA: NEGATIVE mg/dL
Ketones, ur: NEGATIVE mg/dL
LEUKOCYTES UA: NEGATIVE
Nitrite: NEGATIVE
Protein, ur: NEGATIVE mg/dL
SPECIFIC GRAVITY, URINE: 1.01 (ref 1.005–1.030)
UROBILINOGEN UA: 0.2 mg/dL (ref 0.0–1.0)
pH: 5 (ref 5.0–8.0)

## 2014-03-11 LAB — CBC
HCT: 36.8 % — ABNORMAL LOW (ref 39.0–52.0)
Hemoglobin: 12.1 g/dL — ABNORMAL LOW (ref 13.0–17.0)
MCH: 30.1 pg (ref 26.0–34.0)
MCHC: 32.9 g/dL (ref 30.0–36.0)
MCV: 91.5 fL (ref 78.0–100.0)
PLATELETS: 167 10*3/uL (ref 150–400)
RBC: 4.02 MIL/uL — ABNORMAL LOW (ref 4.22–5.81)
RDW: 13.5 % (ref 11.5–15.5)
WBC: 8.1 10*3/uL (ref 4.0–10.5)

## 2014-03-11 LAB — URINE MICROSCOPIC-ADD ON

## 2014-03-11 LAB — CBG MONITORING, ED
Glucose-Capillary: 65 mg/dL — ABNORMAL LOW (ref 70–99)
Glucose-Capillary: 106 mg/dL — ABNORMAL HIGH (ref 70–99)

## 2014-03-11 LAB — TROPONIN I: Troponin I: <0.3 ng/mL (ref <0.30)

## 2014-03-11 LAB — I-STAT CG4 LACTIC ACID, ED: Lactic Acid, Venous: 1.31 mmol/L (ref 0.5–2.2)

## 2014-03-11 LAB — PRO B NATRIURETIC PEPTIDE: Pro B Natriuretic peptide (BNP): 1497 pg/mL — ABNORMAL HIGH (ref 0–450)

## 2014-03-11 MED ORDER — SODIUM CHLORIDE 0.9 % IV BOLUS (SEPSIS): 500.0000 mL | Freq: Once | INTRAVENOUS | Status: DC

## 2014-03-11 MED ORDER — SODIUM CHLORIDE 0.9 % IJ SOLN
3.0000 mL | Freq: Two times a day (BID) | INTRAMUSCULAR | Status: DC
  Administered 2014-03-12 – 2014-03-17 (×7): 3 mL via INTRAVENOUS

## 2014-03-11 MED ORDER — OXYBUTYNIN CHLORIDE ER 5 MG PO TB24
5.0000 mg | ORAL_TABLET | Freq: Every day | ORAL | Status: DC
  Administered 2014-03-12 – 2014-03-13 (×2): 5 mg via ORAL
  Filled 2014-03-11 (×5): qty 1

## 2014-03-11 MED ORDER — SODIUM CHLORIDE 0.9 % IV BOLUS (SEPSIS)
500.0000 mL | Freq: Once | INTRAVENOUS | Status: AC
  Administered 2014-03-11: 500 mL via INTRAVENOUS

## 2014-03-11 MED ORDER — ATORVASTATIN CALCIUM 20 MG PO TABS
20.0000 mg | ORAL_TABLET | Freq: Every morning | ORAL | Status: DC
  Administered 2014-03-12 – 2014-03-17 (×6): 20 mg via ORAL
  Filled 2014-03-11 (×7): qty 1

## 2014-03-11 MED ORDER — INSULIN ASPART 100 UNIT/ML ~~LOC~~ SOLN
0.0000 [IU] | SUBCUTANEOUS | Status: DC
  Administered 2014-03-14: 1 [IU] via SUBCUTANEOUS
  Administered 2014-03-14: 2 [IU] via SUBCUTANEOUS
  Administered 2014-03-15 – 2014-03-16 (×3): 1 [IU] via SUBCUTANEOUS
  Administered 2014-03-16: 2 [IU] via SUBCUTANEOUS
  Administered 2014-03-16: 1 [IU] via SUBCUTANEOUS
  Administered 2014-03-17: 2 [IU] via SUBCUTANEOUS

## 2014-03-11 MED ORDER — HEPARIN (PORCINE) IN NACL 100-0.45 UNIT/ML-% IJ SOLN
1300.0000 [IU]/h | INTRAMUSCULAR | Status: DC
  Administered 2014-03-11: 1300 [IU]/h via INTRAVENOUS
  Filled 2014-03-11 (×2): qty 250

## 2014-03-11 MED ORDER — SODIUM CHLORIDE 0.9 % IV SOLN
INTRAVENOUS | Status: DC
  Administered 2014-03-11: 23:00:00 via INTRAVENOUS

## 2014-03-11 MED ORDER — LEVETIRACETAM 500 MG PO TABS
500.0000 mg | ORAL_TABLET | Freq: Two times a day (BID) | ORAL | Status: DC
  Administered 2014-03-11 – 2014-03-17 (×12): 500 mg via ORAL
  Filled 2014-03-11 (×13): qty 1

## 2014-03-11 NOTE — Progress Notes (Signed)
   Subjective:    Patient ID: Taylor Jacobson, male    DOB: [birth year], 80 y.o.   MRN: [id number]  HPI Patient is here for c/o weakness and fatigue.  Patient daughter accompanies patient and states he has not been eating or drinking. He has been having confusion today.  He is not able to give hx.    Review of Systems  Constitutional: Negative for fever.  HENT: Negative for ear pain.   Eyes: Negative for discharge.  Respiratory: Negative for cough.   Cardiovascular: Negative for chest pain.  Gastrointestinal: Negative for abdominal distention.  Endocrine: Negative for polyuria.  Genitourinary: Negative for difficulty urinating.  Musculoskeletal: Negative for gait problem and neck pain.  Skin: Negative for color change and rash.  Neurological: Negative for speech difficulty and headaches.  Psychiatric/Behavioral: Negative for agitation.       Objective:    Ht 5\' 9"  (1.753 m) Physical Exam  Constitutional: He is oriented to person, place, and time.  Confused, pale, acutely ill elderly male  HENT:  Head: Normocephalic and atraumatic.  Mouth/Throat: Oropharynx is clear and moist.  Eyes: Pupils are equal, round, and reactive to light.  Neck: Normal range of motion. Neck supple.  Cardiovascular: Normal rate and regular rhythm.   No murmur heard. Pulmonary/Chest: Effort normal and breath sounds normal.  Abdominal: Soft. Bowel sounds are normal. There is no tenderness.  Neurological: He is alert and oriented to person, place, and time.  Skin: Skin is warm and dry. There is pallor.  Fingers bilateral hands with clubbing  Psychiatric: He has a normal mood and affect.  Unable to get radial pulses or bp.        Assessment & Plan:     ICD-9-CM ICD-10-CM   1. Cough 786.2 R05 POCT CBC     DG Chest 2 View  2. Acute confusional state 293.0 F05 POCT UA - Microscopic Only     POCT urinalysis dipstick  3. SOB (shortness of breath) 786.05 R06.02 POCT CBC     DG Chest 2 View    Call 911 and have transport to ED.  No Follow-up on file.  Deatra CanterWilliam J Loryn Haacke FNP

## 2014-03-11 NOTE — ED Notes (Signed)
Report called to floor;floor RN request to hold pt while she calls rapped response to look at pt.

## 2014-03-11 NOTE — ED Notes (Signed)
Per wife, pt has been SOB for a week or more and he hasn't been eating like normal. Wife also reports increased confusion over the last week.

## 2014-03-11 NOTE — ED Notes (Signed)
Rapid Response RN at bedside.  

## 2014-03-11 NOTE — Progress Notes (Signed)
ANTICOAGULATION CONSULT NOTE - Initial Consult  Pharmacy Consult for heparin Indication: atrial fibrillation  No Known Allergies  Patient Measurements:   Heparin Dosing Weight: 92.4kg  Vital Signs: Temp: 97.6 F (36.4 C) (11/13 1636) Temp Source: Oral (11/13 1636) BP: 106/91 mmHg (11/13 2045) Pulse Rate: 63 (11/13 2045)  Labs:  Recent Labs  03/11/14 1642  HGB 12.1*  HCT 36.8*  PLT 167  CREATININE 4.63*  TROPONINI <0.30    CrCl cannot be calculated (Unknown ideal weight.).   Medical History: Past Medical History  Diagnosis Date  . CAD (coronary artery disease)     a. 1997 s/p CABG x 4: LIMA->LAD, VG->Diag, VG->OM, VG->dRCA;  b. 07/2000 Cath: LM nl, LAD 6164m, RI 99p, LCX 100p, RCA 90, VG->RCA nl, VG->OM nl, VG->Diag nl, LIMA->LAD nl.  . Diabetes   . Chronic systolic CHF (congestive heart failure)     a. 07/2012 Echo: EF 25-30%, diff HK, triv AI, mild MR, mod dil LA, mod reduced RV syst fxn, mildly dil RA.  . Cardiomyopathy, ischemic   . Obesity   . HTN (hypertension), benign   . Hyperlipidemia   . Subdural hematoma     a. 2009.  Marland Kitchen. OSA (obstructive sleep apnea)     a. uses CPAP  . Atrial fibrillation     a. Eliquis started 06/2012.  Marland Kitchen. A-fib     Medications:  Infusions:  . heparin      Assessment: 87 yom presented to the ED with AMS. He is on chronic anticoagulation with apixaban PTA for treatment of afib. However, Scr is markedly elevated at 4.63 so being switched over to heparin IV for now. Patients family confirmed that his last dose of apixaban was at ~10am today.  Baseline H/H slightly low and platelets are WNL. No bleeding noted.   Goal of Therapy:  Heparin level 0.3-0.7 units/ml aPTT 66-102 seconds Monitor platelets by anticoagulation protocol: Yes   Plan:  1. Heparin gtt 1300 units/hr - no bolus (starting ~12 hours after last apixaban dose) 2. Check an 8 hour heparin level and aPTT 3. Daily heparin level, CBC and aPTT 4. F/u oral anticoag  plans  Taylor Jacobson, Taylor Jacobson 03/11/2014,10:07 PM

## 2014-03-11 NOTE — H&P (Signed)
[facility] History and Physical  Taylor Jacobson ZOX:[id number] DOB: 1927-01-30    PCP:   Taylor Heap, MD   Chief Complaint: altered mental status.  HPI: Taylor Jacobson is an 80 y.o. male wih hx of DM, CKD, HTN, ischemic CMP and CHF, Obesity, afib on Eliquis, CAD s/p CABG, presented to the ER with generalized fatigue and confusion for the past couple of week.  He was seen by his PCP and was sent here.  Evalaution included an EKG showing afib/flutter with controlled rate.  His Cr was found to be 4.6, with baseline around 2, and BUN of 137.  His CXR showed cardiomegaly but no CHF, and his BNP was 1400s, with troponin of 0.3.  He has been on Lasix and Lisinopril.  An I/O showed no urinary retention, and UA showed no cell casts nor WBCs.  Hospitalist was asked to admit him for AKI on CKD.  Rewiew of Systems:  Constitutional: Negative for malaise, fever and chills. No significant weight loss or weight gain Eyes: Negative for eye pain, redness and discharge, diplopia, visual changes, or flashes of light. ENMT: Negative for ear pain, hoarseness, nasal congestion, sinus pressure and sore throat. No headaches; tinnitus, drooling, or problem swallowing. Cardiovascular: Negative for chest pain, palpitations, diaphoresis, dyspnea and peripheral edema. ; No orthopnea, PND Respiratory: Negative for cough, hemoptysis, wheezing and stridor. No pleuritic chestpain. Gastrointestinal: Negative for nausea, vomiting, diarrhea, constipation, abdominal pain, melena, blood in stool, hematemesis, jaundice and rectal bleeding.    Genitourinary: Negative for frequency, dysuria, incontinence,flank pain and hematuria; Musculoskeletal: Negative for back pain and neck pain. Negative for swelling and trauma.;  Skin: . Negative for pruritus, rash, abrasions, bruising and skin lesion.; ulcerations Neuro: Negative for headache, lightheadedness and neck stiffness. Negative for weakness, altered level of  consciousness , altered mental status, extremity weakness, burning feet, involuntary movement, seizure and syncope.  Psych: negative for anxiety, depression, insomnia, tearfulness, panic attacks, hallucinations, paranoia, suicidal or homicidal ideation    Past Medical History  Diagnosis Date  . CAD (coronary artery disease)     a. 1997 s/p CABG x 4: LIMA->LAD, VG->Diag, VG->OM, VG->dRCA;  b. 07/2000 Cath: LM nl, LAD 60m, RI 99p, LCX 100p, RCA 90, VG->RCA nl, VG->OM nl, VG->Diag nl, LIMA->LAD nl.  . Diabetes   . Chronic systolic CHF (congestive heart failure)     a. 07/2012 Echo: EF 25-30%, diff HK, triv AI, mild MR, mod dil LA, mod reduced RV syst fxn, mildly dil RA.  . Cardiomyopathy, ischemic   . Obesity   . HTN (hypertension), benign   . Hyperlipidemia   . Subdural hematoma     a. 2009.  Marland Kitchen OSA (obstructive sleep apnea)     a. uses CPAP  . Atrial fibrillation     a. Eliquis started 06/2012.  Marland Kitchen A-fib     Past Surgical History  Procedure Laterality Date  . Coronary artery bypass graft      1997 L - LAD, SVG D1, SVG OM, SVG RCA.  Grafts patent on cath 2002  . Hemorrhoid surgery    . Cholecystectomy    . Bilateral total knee replacements    . Nasal septum surgery      Medications:  HOME MEDS: Prior to Admission medications   Medication Sig Start Date End Date Taking? Authorizing Provider  apixaban (ELIQUIS) 2.5 MG TABS tablet Take 2.5 mg by mouth 2 (two) times daily.   Yes Historical Provider, MD  atorvastatin (LIPITOR) 20 MG tablet Take  20 mg by mouth every morning.   Yes Historical Provider, MD  doxycycline (VIBRA-TABS) 100 MG tablet Take 1 tablet (100 mg total) by mouth 2 (two) times daily. 02/22/14  Yes Deatra CanterWilliam J Oxford, FNP  furosemide (LASIX) 40 MG tablet Take 40-80 mg by mouth 2 (two) times daily. Two in the morning and one in the evening   Yes Historical Provider, MD  KEPPRA 500 MG tablet TAKE 1 TABLET TWICE A DAY 01/24/14  Yes Mary-Margaret Daphine DeutscherMartin, FNP  KLOR-CON M20 20  MEQ tablet TAKE 1 TABLET DAILY 01/31/14  Yes Rollene RotundaJames Hochrein, MD  LANTUS 100 UNIT/ML injection INJECT 40 UNITS UNDER THE SKIN AT BEDTIME 12/28/13  Yes Mary-Margaret Daphine DeutscherMartin, FNP  lisinopril (PRINIVIL,ZESTRIL) 5 MG tablet Take 5 mg by mouth every morning.   Yes Historical Provider, MD  loratadine (CLARITIN) 10 MG tablet Take 10 mg by mouth every morning.   Yes Historical Provider, MD  mirabegron ER (MYRBETRIQ) 25 MG TB24 tablet Take 25 mg by mouth every morning.   Yes Historical Provider, MD  oxybutynin (DITROPAN-XL) 5 MG 24 hr tablet TAKE 1 TABLET DAILY 01/24/14  Yes Mary-Margaret Daphine DeutscherMartin, FNP  ELIQUIS 2.5 MG TABS tablet TAKE 1 TABLET TWICE A DAY Patient not taking: Reported on 03/11/2014 12/28/13   Mary-Margaret Daphine DeutscherMartin, FNP  Vitamin D, Ergocalciferol, (DRISDOL) 50000 UNITS CAPS capsule Take 50,000 Units by mouth every Wednesday.    Historical Provider, MD     Allergies:  No Known Allergies  Social History:   reports that he has quit smoking. His smoking use included Cigarettes. He has a .02 pack-year smoking history. He has never used smokeless tobacco. He reports that he does not drink alcohol or use illicit drugs.  Family History: Family History  Problem Relation Age of Onset  . Other Mother     died during childbirth.  . Stroke Father      Physical Exam: Filed Vitals:   03/11/14 1745 03/11/14 1800 03/11/14 1830 03/11/14 1845  BP: 113/40 102/79 94/75 102/76  Pulse: 65 65 65 66  Temp:      TempSrc:      Resp: 20 27 19 18   SpO2: 99% 98% 98% 97%   Blood pressure 102/76, pulse 66, temperature 97.6 F (36.4 C), temperature source Oral, resp. rate 18, SpO2 97 %.  GEN:  Pleasant patient lying in the stretcher in no acute distress; cooperative with exam. PSYCH:  alert and oriented x4; does not appear anxious or depressed; affect is appropriate. HEENT: Mucous membranes pink and anicteric; PERRLA; EOM intact; no cervical lymphadenopathy nor thyromegaly or carotid bruit; no JVD; There were  no stridor. Neck is very supple. Breasts:: Not examined CHEST WALL: No tenderness CHEST: Normal respiration, clear to auscultation bilaterally.  HEART: Regular rate and rhythm.  There are no murmur, rub, or gallops.   BACK: No kyphosis or scoliosis; no CVA tenderness ABDOMEN: soft and non-tender; no masses, no organomegaly, normal abdominal bowel sounds; no pannus; no intertriginous candida. There is no rebound and no distention. Rectal Exam: Not done EXTREMITIES: No bone or joint deformity; age-appropriate arthropathy of the hands and knees; no edema; no ulcerations.  There is no calf tenderness. Genitalia: not examined PULSES: 2+ and symmetric SKIN: Normal hydration no rash or ulceration CNS: Cranial nerves 2-12 grossly intact no focal lateralizing neurologic deficit.  Speech is fluent; uvula elevated with phonation, facial symmetry and tongue midline. DTR are normal bilaterally, cerebella exam is intact, barbinski is negative and strengths are equaled bilaterally.  No sensory  loss.   Labs on Admission:  Basic Metabolic Panel:  Recent Labs Lab 03/11/14 1642  NA 141  K 5.8*  CL 106  CO2 14*  GLUCOSE 83  BUN 137*  CREATININE 4.63*  CALCIUM 8.3*    Recent Labs Lab 03/11/14 1642  WBC 8.1  HGB 12.1*  HCT 36.8*  MCV 91.5  PLT 167   Cardiac Enzymes:  Recent Labs Lab 03/11/14 1642  TROPONINI <0.30    CBG: No results for input(s): GLUCAP in the last 168 hours.   Radiological Exams on Admission: Dg Chest 1 View  03/11/2014   CLINICAL DATA:  Cough  EXAM: CHEST - 1 VIEW  COMPARISON:  November 03, 2012  FINDINGS: There is atelectatic change in the left mid lung. There is stable interstitial prominence in the right mid lung, likely representing localized fibrosis. No frank edema or consolidation. Heart is enlarged with pulmonary vascularity within normal limits. No adenopathy. Patient is status post coronary artery bypass grafting.  IMPRESSION: Probable interstitial fibrosis  right mid lung, stable. Mild atelectasis left mid lung. No edema or consolidation. Stable cardiomegaly.   Electronically Signed   By: Bretta BangWilliam  Woodruff M.D.   On: 03/11/2014 15:13   Dg Chest Portable 1 View  03/11/2014   CLINICAL DATA:  Shortness of breath, altered mental status, diaphoresis  EXAM: PORTABLE CHEST - 1 VIEW  COMPARISON:  03/11/2014  FINDINGS: Evidence of CABG reidentified. Moderate enlargement of the cardiac silhouette is reidentified. Diffusely prominent interstitial reticular opacities are noted. No new focal opacity. Lungs are hypoaerated with crowding of the bronchovascular markings. Cardiac leads are present. Trace pleural fluid.  IMPRESSION: Chronically prominent interstitial markings with low lung volumes in a pattern that may suggest usual interstitial pneumonitis but is nonspecific and would be better evaluated at dedicated high-resolution chest CT performed on a nonemergent basis.   Electronically Signed   By: Christiana PellantGretchen  Green M.D.   On: 03/11/2014 17:06    EKG: Independently reviewed. afib with controlled rate.   Assessment/Plan Present on Admission:  . Altered mental status . Acute renal failure . Coronary atherosclerosis . AKI (acute kidney injury)  PLAN:  Will admit him for AKI, likely from volume depletion on top of decrease GFR from ACE I.   Will get a renal US to be sure he has no concomitant hydronephrosis ( I doubt it).  His BP is low in the ER, but he is alert and orient and showed no sign of shock.  Will be careful as he has severe CHF with respect to his volume repletion.  Will d/c all nephrotoxic drug.  Follow Cr carefully and expect improvement.  With respect to his afib and anticoagulation, since Eliquis will have to be stopped for now (ARF), will give IV heparin per pharmacy dosing.  For his DM, will give sensitive SSI.  The nursing staff did not feel comfortable accepting him to telemetry due to his hypotension, therefore, I will admit him to SDU.  I had a  chance to discuss code status with him, his wife at the bedside, and his daughter at the bedside, and confirmed that he is a DNR.  Will comply. Thank you for allowing me to participate in his care.   Other plans as per orders.  Code Status: Eartha InchNR>   Gaetan Spieker, MD. [facility] Pager [phone] 7pm to 7am.  03/11/2014, 7:39 PM

## 2014-03-11 NOTE — ED Provider Notes (Signed)
CSN: [id number]     Arrival date & time 03/11/14  1620 History   First MD Initiated Contact with Patient 03/11/14 1621     Chief Complaint  Patient presents with  . Altered Mental Status     (Consider location/radiation/quality/duration/timing/severity/associated sxs/prior Treatment) HPI Comments: This is an 80 year old male with a past medical history of CAD, diabetes, chronic systolic CHF, ischemic cardiomyopathy, hypertension, hyperlipidemia and atrial fibrillation (on Eliquis) who presents to the emergency department via EMS from [facility] with "heart problems". EMS was called to the practice after patient was being evaluated for "heart problems" and was noted to have altered mental status. On EMS arrival, patient was noted to be pale, short of breath, diaphoretic and confused. He was also hypotensive around 90/60 and placed on a nonrebreather. EMS states patient's fingers appear blue. He was given 1500 mL saline bolus. Currently he is denying any pain, shortness of breath or nausea. He is diaphoretic on arrival. Level V caveat secondary to altered mental status. At present, no family is with the patient, however they were present with him at the PCPs office. EMS states they did not get a very good history.  Patient is a 80 y.o. male presenting with altered mental status. The history is provided by the EMS personnel.  Altered Mental Status   Past Medical History  Diagnosis Date  . CAD (coronary artery disease)     a. 1997 s/p CABG x 4: LIMA->LAD, VG->Diag, VG->OM, VG->dRCA;  b. 07/2000 Cath: LM nl, LAD 23m, RI 99p, LCX 100p, RCA 90, VG->RCA nl, VG->OM nl, VG->Diag nl, LIMA->LAD nl.  . Diabetes   . Chronic systolic CHF (congestive heart failure)     a. 07/2012 Echo: EF 25-30%, diff HK, triv AI, mild MR, mod dil LA, mod reduced RV syst fxn, mildly dil RA.  . Cardiomyopathy, ischemic   . Obesity   . HTN (hypertension), benign   . Hyperlipidemia   . Subdural hematoma    a. 2009.  Marland Kitchen OSA (obstructive sleep apnea)     a. uses CPAP  . Atrial fibrillation     a. Eliquis started 06/2012.  Marland Kitchen A-fib    Past Surgical History  Procedure Laterality Date  . Coronary artery bypass graft      1997 L - LAD, SVG D1, SVG OM, SVG RCA.  Grafts patent on cath 2002  . Hemorrhoid surgery    . Cholecystectomy    . Bilateral total knee replacements    . Nasal septum surgery     Family History  Problem Relation Age of Onset  . Other Mother     died during childbirth.  . Stroke Father    History  Substance Use Topics  . Smoking status: Former Smoker -- 0.04 packs/day for .5 years    Types: Cigarettes  . Smokeless tobacco: Never Used     Comment: Distant past  . Alcohol Use: No    Review of Systems  Unable to perform ROS: Mental status change      Allergies  Review of patient's allergies indicates no known allergies.  Home Medications   Prior to Admission medications   Medication Sig Start Date End Date Taking? Authorizing Provider  apixaban (ELIQUIS) 2.5 MG TABS tablet Take 2.5 mg by mouth 2 (two) times daily.   Yes Historical Provider, MD  atorvastatin (LIPITOR) 20 MG tablet Take 20 mg by mouth every morning.   Yes Historical Provider, MD  doxycycline (VIBRA-TABS) 100 MG tablet Take 1 tablet (  100 mg total) by mouth 2 (two) times daily. 02/22/14  Yes Deatra CanterWilliam J Oxford, FNP  furosemide (LASIX) 40 MG tablet Take 40-80 mg by mouth 2 (two) times daily. Two in the morning and one in the evening   Yes Historical Provider, MD  KEPPRA 500 MG tablet TAKE 1 TABLET TWICE A DAY 01/24/14  Yes Mary-Margaret Daphine DeutscherMartin, FNP  KLOR-CON M20 20 MEQ tablet TAKE 1 TABLET DAILY 01/31/14  Yes Rollene RotundaJames Hochrein, MD  LANTUS 100 UNIT/ML injection INJECT 40 UNITS UNDER THE SKIN AT BEDTIME 12/28/13  Yes Mary-Margaret Daphine DeutscherMartin, FNP  lisinopril (PRINIVIL,ZESTRIL) 5 MG tablet Take 5 mg by mouth every morning.   Yes Historical Provider, MD  loratadine (CLARITIN) 10 MG tablet Take 10 mg by mouth every  morning.   Yes Historical Provider, MD  mirabegron ER (MYRBETRIQ) 25 MG TB24 tablet Take 25 mg by mouth every morning.   Yes Historical Provider, MD  oxybutynin (DITROPAN-XL) 5 MG 24 hr tablet TAKE 1 TABLET DAILY 01/24/14  Yes Mary-Margaret Daphine DeutscherMartin, FNP  ELIQUIS 2.5 MG TABS tablet TAKE 1 TABLET TWICE A DAY Patient not taking: Reported on 03/11/2014 12/28/13   Mary-Margaret Daphine DeutscherMartin, FNP  Vitamin D, Ergocalciferol, (DRISDOL) 50000 UNITS CAPS capsule Take 50,000 Units by mouth every Wednesday.    Historical Provider, MD   BP 102/79 mmHg  Pulse 65  Temp(Src) 97.6 F (36.4 C) (Oral)  Resp 27  SpO2 98% Physical Exam  Constitutional: He appears well-developed and well-nourished. No distress.  Obese.  HENT:  Head: Normocephalic and atraumatic.  Mouth/Throat: Oropharynx is clear and moist.  Eyes: Conjunctivae and EOM are normal. Pupils are equal, round, and reactive to light.  Neck: Normal range of motion. Neck supple. No JVD present.  Cardiovascular: Normal rate, normal heart sounds and intact distal pulses.  An irregularly irregular rhythm present.  Trace LE pitting edema BL.  Pulmonary/Chest: Effort normal and breath sounds normal.  Labored breathing.  Abdominal: Soft. Bowel sounds are normal. There is no tenderness.  Musculoskeletal: Normal range of motion. He exhibits no edema.  Neurological: He is alert. He has normal strength. No sensory deficit.  Alert and oriented to person. Equal grip strength bilateral.  Skin: Skin is warm. He is diaphoretic.  Psychiatric: He has a normal mood and affect. His behavior is normal.  Nursing note and vitals reviewed.   ED Course  Procedures (including critical care time) Labs Review Labs Reviewed  CBC - Abnormal; Notable for the following:    RBC 4.02 (*)    Hemoglobin 12.1 (*)    HCT 36.8 (*)    All other components within normal limits  BASIC METABOLIC PANEL - Abnormal; Notable for the following:    Potassium 5.8 (*)    CO2 14 (*)    BUN 137  (*)    Creatinine, Ser 4.63 (*)    Calcium 8.3 (*)    GFR calc non Af Amer 10 (*)    GFR calc Af Amer 12 (*)    Anion gap 21 (*)    All other components within normal limits  PRO B NATRIURETIC PEPTIDE - Abnormal; Notable for the following:    Pro B Natriuretic peptide (BNP) 1497.0 (*)    All other components within normal limits  URINALYSIS, ROUTINE W REFLEX MICROSCOPIC - Abnormal; Notable for the following:    APPearance CLOUDY (*)    Hgb urine dipstick MODERATE (*)    All other components within normal limits  URINE MICROSCOPIC-ADD ON - Abnormal; Notable for the  following:    Casts HYALINE CASTS (*)    All other components within normal limits  URINE CULTURE  TROPONIN I  I-STAT CG4 LACTIC ACID, ED    Imaging Review Dg Chest 1 View  03/11/2014   CLINICAL DATA:  Cough  EXAM: CHEST - 1 VIEW  COMPARISON:  November 03, 2012  FINDINGS: There is atelectatic change in the left mid lung. There is stable interstitial prominence in the right mid lung, likely representing localized fibrosis. No frank edema or consolidation. Heart is enlarged with pulmonary vascularity within normal limits. No adenopathy. Patient is status post coronary artery bypass grafting.  IMPRESSION: Probable interstitial fibrosis right mid lung, stable. Mild atelectasis left mid lung. No edema or consolidation. Stable cardiomegaly.   Electronically Signed   By: Bretta BangWilliam  Woodruff M.D.   On: 03/11/2014 15:13   Dg Chest Portable 1 View  03/11/2014   CLINICAL DATA:  Shortness of breath, altered mental status, diaphoresis  EXAM: PORTABLE CHEST - 1 VIEW  COMPARISON:  03/11/2014  FINDINGS: Evidence of CABG reidentified. Moderate enlargement of the cardiac silhouette is reidentified. Diffusely prominent interstitial reticular opacities are noted. No new focal opacity. Lungs are hypoaerated with crowding of the bronchovascular markings. Cardiac leads are present. Trace pleural fluid.  IMPRESSION: Chronically prominent interstitial  markings with low lung volumes in a pattern that may suggest usual interstitial pneumonitis but is nonspecific and would be better evaluated at dedicated high-resolution chest CT performed on a nonemergent basis.   Electronically Signed   By: Christiana PellantGretchen  Green M.D.   On: 03/11/2014 17:06     EKG Interpretation   Date/Time:  Friday March 11 2014 16:44:13 EST Ventricular Rate:    PR Interval:    QRS Duration: 128 QT Interval:  402 QTC Calculation:   R Axis:   -10 Text Interpretation:  Difficult to assess rhythm due to artifact and poor  baseline Rate around 70. no acute ST/T changes Confirmed by GOLDSTON  MD,  SCOTT ([id number]) on 03/11/2014 4:52:57 PM      MDM   Final diagnoses:  Acute renal failure, unspecified acute renal failure type  Altered mental status, unspecified altered mental status type   Patient presenting with altered mental status from PCPs office as stated above. He is nontoxic appearing and in no apparent distress. Increased effort of breathing, hypotensive, afebrile. Atrial fibrillation noted, this is patient's baseline. Labs showing acute renal failure. No evidence of acute CHF. Pt volume depleted. He was given IVF prior to arrival and again in the ED. Pt will be admitted. [facility]  Discussed with attending Dr. Criss AlvineGoldston who also evaluated patient and agrees with plan of care.    Kathrynn SpeedRobyn M Kimball Manske, PA-C 03/11/14 1908  Audree CamelScott T Goldston, MD 03/12/14 [phone]

## 2014-03-11 NOTE — Progress Notes (Signed)
Asked to triage patient with low BP and altered mental status by 5W RN.  On my arrival to ED - Dr. Nedra HaiLee - admitting MD present - updated by ED RN on patients status.  Dr. Nedra HaiLee gave ED RN order to change admit to SDU.

## 2014-03-11 NOTE — ED Notes (Signed)
Per EMS: [facility]/o altered mental status. Per staff he was being seen there for "heart problems." On EMS arrival pt noted to be pale, SOB, diaphoretic and confused. Initial BP 110 systolic. Pt given 1500 mL NS and placed on NRB. On arrival to ED, pt is able to follow commands. Denies any pain or SOB. VSS.

## 2014-03-12 DIAGNOSIS — I5022 Chronic systolic (congestive) heart failure: Secondary | ICD-10-CM | POA: Diagnosis present

## 2014-03-12 DIAGNOSIS — N179 Acute kidney failure, unspecified: Principal | ICD-10-CM

## 2014-03-12 DIAGNOSIS — D649 Anemia, unspecified: Secondary | ICD-10-CM | POA: Diagnosis present

## 2014-03-12 DIAGNOSIS — E663 Overweight: Secondary | ICD-10-CM

## 2014-03-12 DIAGNOSIS — I4891 Unspecified atrial fibrillation: Secondary | ICD-10-CM | POA: Diagnosis present

## 2014-03-12 DIAGNOSIS — I482 Chronic atrial fibrillation: Secondary | ICD-10-CM

## 2014-03-12 DIAGNOSIS — E1122 Type 2 diabetes mellitus with diabetic chronic kidney disease: Secondary | ICD-10-CM

## 2014-03-12 DIAGNOSIS — N189 Chronic kidney disease, unspecified: Secondary | ICD-10-CM

## 2014-03-12 LAB — COMPREHENSIVE METABOLIC PANEL
ALT: 39 U/L (ref 0–53)
AST: 56 U/L — ABNORMAL HIGH (ref 0–37)
Albumin: 2.5 g/dL — ABNORMAL LOW (ref 3.5–5.2)
Alkaline Phosphatase: 142 U/L — ABNORMAL HIGH (ref 39–117)
Anion gap: 17 — ABNORMAL HIGH (ref 5–15)
BUN: 123 mg/dL — AB (ref 6–23)
CALCIUM: 8 mg/dL — AB (ref 8.4–10.5)
CO2: 15 mEq/L — ABNORMAL LOW (ref 19–32)
Chloride: 111 mEq/L (ref 96–112)
Creatinine, Ser: 3.93 mg/dL — ABNORMAL HIGH (ref 0.50–1.35)
GFR, EST AFRICAN AMERICAN: 15 mL/min — AB (ref >90)
GFR, EST NON AFRICAN AMERICAN: 13 mL/min — AB (ref >90)
GLUCOSE: 71 mg/dL (ref 70–99)
Potassium: 5.5 mEq/L — ABNORMAL HIGH (ref 3.7–5.3)
Sodium: 143 mEq/L (ref 137–147)
TOTAL PROTEIN: 6.4 g/dL (ref 6.0–8.3)
Total Bilirubin: 0.4 mg/dL (ref 0.3–1.2)

## 2014-03-12 LAB — PROTIME-INR
INR: 1.66 — AB (ref 0.00–1.49)
Prothrombin Time: 19.8 seconds — ABNORMAL HIGH (ref 11.6–15.2)

## 2014-03-12 LAB — APTT
aPTT: 171 seconds — ABNORMAL HIGH (ref 24–37)
aPTT: 63 seconds — ABNORMAL HIGH (ref 24–37)

## 2014-03-12 LAB — CBC
HEMATOCRIT: 33.6 % — AB (ref 39.0–52.0)
HEMOGLOBIN: 11.1 g/dL — AB (ref 13.0–17.0)
MCH: 31 pg (ref 26.0–34.0)
MCHC: 33 g/dL (ref 30.0–36.0)
MCV: 93.9 fL (ref 78.0–100.0)
Platelets: 129 10*3/uL — ABNORMAL LOW (ref 150–400)
RBC: 3.58 MIL/uL — ABNORMAL LOW (ref 4.22–5.81)
RDW: 13.6 % (ref 11.5–15.5)
WBC: 6.2 10*3/uL (ref 4.0–10.5)

## 2014-03-12 LAB — GLUCOSE, CAPILLARY
GLUCOSE-CAPILLARY: 108 mg/dL — AB (ref 70–99)
Glucose-Capillary: 105 mg/dL — ABNORMAL HIGH (ref 70–99)
Glucose-Capillary: 117 mg/dL — ABNORMAL HIGH (ref 70–99)
Glucose-Capillary: 79 mg/dL (ref 70–99)
Glucose-Capillary: 82 mg/dL (ref 70–99)
Glucose-Capillary: 96 mg/dL (ref 70–99)

## 2014-03-12 LAB — HEPARIN LEVEL (UNFRACTIONATED): Heparin Unfractionated: >2.2 IU/mL — ABNORMAL HIGH (ref 0.30–0.70)

## 2014-03-12 LAB — MRSA PCR SCREENING: MRSA BY PCR: POSITIVE — AB

## 2014-03-12 MED ORDER — PNEUMOCOCCAL VAC POLYVALENT 25 MCG/0.5ML IJ INJ
0.5000 mL | INJECTION | INTRAMUSCULAR | Status: AC
  Administered 2014-03-14: 0.5 mL via INTRAMUSCULAR
  Filled 2014-03-12 (×2): qty 0.5

## 2014-03-12 MED ORDER — MUPIROCIN 2 % EX OINT
1.0000 | TOPICAL_OINTMENT | Freq: Two times a day (BID) | CUTANEOUS | Status: AC
Start: 2014-03-12 — End: 2014-03-17
  Administered 2014-03-12 – 2014-03-17 (×10): 1 via NASAL
  Filled 2014-03-12 (×3): qty 22

## 2014-03-12 MED ORDER — HEPARIN (PORCINE) IN NACL 100-0.45 UNIT/ML-% IJ SOLN
1100.0000 [IU]/h | INTRAMUSCULAR | Status: DC
  Administered 2014-03-12: 1100 [IU]/h via INTRAVENOUS
  Filled 2014-03-12 (×3): qty 250

## 2014-03-12 MED ORDER — DEXTROSE-NACL 5-0.9 % IV SOLN
INTRAVENOUS | Status: DC
  Administered 2014-03-12 – 2014-03-13 (×2): via INTRAVENOUS

## 2014-03-12 MED ORDER — INFLUENZA VAC SPLIT QUAD 0.5 ML IM SUSY
0.5000 mL | PREFILLED_SYRINGE | INTRAMUSCULAR | Status: AC
  Administered 2014-03-14: 0.5 mL via INTRAMUSCULAR
  Filled 2014-03-12 (×2): qty 0.5

## 2014-03-12 MED ORDER — CHLORHEXIDINE GLUCONATE CLOTH 2 % EX PADS
6.0000 | MEDICATED_PAD | Freq: Every day | CUTANEOUS | Status: AC
  Administered 2014-03-13 – 2014-03-17 (×5): 6 via TOPICAL

## 2014-03-12 MED ORDER — CETYLPYRIDINIUM CHLORIDE 0.05 % MT LIQD
7.0000 mL | Freq: Two times a day (BID) | OROMUCOSAL | Status: DC
  Administered 2014-03-12 – 2014-03-17 (×8): 7 mL via OROMUCOSAL

## 2014-03-12 NOTE — Plan of Care (Signed)
Problem: Phase I Progression Outcomes Goal: Pain controlled with appropriate interventions Outcome: Completed/Met Date Met:  February 19, 2014

## 2014-03-12 NOTE — Progress Notes (Signed)
ANTICOAGULATION CONSULT NOTE - Follow Up Consult  Pharmacy Consult for Heparin Indication: atrial fibrillation  No Known Allergies  Patient Measurements: Height: 6\' 1"  (185.4 cm) Weight: 220 lb 0.3 oz (99.8 kg) IBW/kg (Calculated) : 79.9 Heparin Dosing Weight: 92.4 kg  Vital Signs: Temp: 97.9 F (36.6 C) (11/14 1925) Temp Source: Oral (11/14 1925) BP: 98/67 mmHg (11/14 1925) Pulse Rate: 64 (11/14 1925)  Labs:  Recent Labs  03/11/14 1642 03/12/14 0630 03/12/14 1917  HGB 12.1* 11.1*  --   HCT 36.8* 33.6*  --   PLT 167 129*  --   APTT  --  171* 63*  LABPROT  --  19.8*  --   INR  --  1.66*  --   HEPARINUNFRC  --  >2.20*  --   CREATININE 4.63* 3.93*  --   TROPONINI <0.30  --   --     Estimated Creatinine Clearance: 17.1 mL/min (by C-G formula based on Cr of 3.93).   Assessment: [age] yoM with history of Afib on chronic anticoagulation with Apixaban prior to arrival. Apixaban is currently being held in setting of AKI. He was started on heparin on 11/13. Initial heparin level > 2.2 (reflective of apixaban not heparin). Using aPTT to monitor heparin until heparin level and aPTT correlate. No bleeding noted. APTT 63 seconds (slightly subtherapeutic) on heparin at 950 units/hr.  Goal of Therapy:  Heparin level 0.3-0.7 units/ml aPTT 66-102 seconds Monitor platelets by anticoagulation protocol: Yes   Plan:  1. Increase heparin to 1100 units/hr 2. F/u daily aPTT and heparin level and CBC  Christoper Fabianaron Minah Axelrod, PharmD, BCPS Clinical pharmacist, pager [phone] 11/14/20158:16 PM

## 2014-03-12 NOTE — Progress Notes (Signed)
ANTICOAGULATION CONSULT NOTE - Follow Up Consult  Pharmacy Consult for Heparin Indication: atrial fibrillation  No Known Allergies  Patient Measurements: Height: 6\' 1"  (185.4 cm) Weight: 220 lb 0.3 oz (99.8 kg) IBW/kg (Calculated) : 79.9 Heparin Dosing Weight: 92.4 kg  Vital Signs: Temp: 97.8 F (36.6 C) (11/14 0750) Temp Source: Oral (11/14 0750) BP: 107/46 mmHg (11/14 0425) Pulse Rate: 72 (11/14 0750)  Labs:  Recent Labs  03/11/14 1642 03/12/14 0630  HGB 12.1* 11.1*  HCT 36.8* 33.6*  PLT 167 129*  APTT  --  171*  LABPROT  --  19.8*  INR  --  1.66*  HEPARINUNFRC  --  >2.20*  CREATININE 4.63* 3.93*  TROPONINI <0.30  --     Estimated Creatinine Clearance: 17.1 mL/min (by C-G formula based on Cr of 3.93).   Assessment: [age] yoM with history of Afib on chronic anticoagulation with Apixaban prior to arrival. Apixaban is currently being held in setting of AKI. He was started on heparin on 11/13 at 1300 units/hr. AM 8 hour aPTT elevated at 171.8 and heparin level > 2.2. Not signs of bleeding documented.  Platelets have trended down to 129, Hgb stable at 11.1.  Heparin level is elevated in setting of apixaban use and AKI. Plan to hold heparin for 2 hours and restart at a reduced dose. Will follow aPTT as that will be more reflective in setting of prior Apixaban use.  Goal of Therapy:  Heparin level 0.3-0.7 units/ml aPTT 66-102 seconds Monitor platelets by anticoagulation protocol: Yes   Plan:  1. Hold Heparin X 2 hours 2. Restart heparin at 930 units/hr at 1130 3. Check 8 hour aPTT at 1930 4. Daily HL, aPTT and CBC  Russ HaloAshley Demica Zook, PharmD Clinical Pharmacist - Resident Pager: [phone] 11/14/201510:30 AM

## 2014-03-12 NOTE — Plan of Care (Signed)
Problem: Consults Goal: General Medical Patient Education See Patient Education Module for specific education. Outcome: Progressing Goal: Skin Care Protocol Initiated - if Braden Score 18 or less If consults are not indicated, leave blank or document N/A Outcome: Completed/Met Date Met:  03/12/14 Goal: Diabetes Guidelines if Diabetic/Glucose > 140 If diabetic or lab glucose is > 140 mg/dl - Initiate Diabetes/Hyperglycemia Guidelines & Document Interventions  Outcome: Completed/Met Date Met:  03/12/14

## 2014-03-12 NOTE — Progress Notes (Signed)
PROGRESS NOTE  Clover Mealyarl Hubert Wandel JXB:[id number] DOB: [birth year] DOA: 03/11/2014 PCP: Rudi HeapMOORE, DONALD, MD  HPI/Recap of past 7024 hours: 80 year old white male with past medical history of diabetes, systolic congestive heart failure with EF of 25%, atrial fib/flutter and chronic kidney disease and baseline creatinine of 2 admitted on 11/13 for increasing fatigue and confusion for the past few weeks and found to be in acute renal failure with a creatinine of 4.63 and a BUN of 137.  Patient also noted to have a hemoglobin of 12-baseline around 14  Patient seen this morning and doing okay. He is rather appropriate. States he is not able to void despite attempts. Denies any pain or shortness of breath.  Assessment/Plan:    Diabetes mellitus:A1c stable and just checked 3 months prior at 7.1. Watching CBGs closely.   Coronary atherosclerosis: Stable    Altered mental status: Agent little bit more alert and oriented and interactive today, likely because of IV fluids    Acute kidney injury in the setting of CKD (chronic kidney disease) stage 3, GFR 30-59 ml/min: Responding to IV fluids. Given markedly elevated BUN, this is prerenal and possibly from GI bleed. See below. Some improvement today and renal function.    Anemia of unknown etiology: I'm strongly concerned about the possibility of GI bleed. Have started him testing stool. His baseline is around 14 and already low, and given markedly elevated BUN, I suspect that his hemoglobin will drop much further. His blood pressures have been soft. Continue to monitor in the stepdown unit.  Chronic systolic heart failure: Gently hydrating  Atrial fibrillation/flutter: Rate controlled. INR subtherapeutic and currently is on heparin. Need to monitor closely for concerned about GI bleed.  Urinary retention: Unable to void. Have placed Foley catheter. This could be another cause of his renal failure   Code Status: DO NOT RESUSCITATE  Family Communication:  left message with family  Disposition Plan: continue in stepdown until renal function back to baseline and have confirmed no GI bleed   Consultants:  none  Procedures:  none  Antibiotics:  none   Objective: BP 99/54 mmHg  Pulse 72  Temp(Src) 97.9 F (36.6 C) (Oral)  Resp 23  Ht 6\' 1"  (1.854 m)  Wt 99.8 kg (220 lb 0.3 oz)  BMI 29.03 kg/m2  SpO2 100%  Intake/Output Summary (Last 24 hours) at 03/12/14 1549 Last data filed at 03/12/14 1000  Gross per 24 hour  Intake   2024 ml  Output   1300 ml  Net    724 ml   Filed Weights   03/11/14 2243  Weight: 99.8 kg (220 lb 0.3 oz)    Exam:   General:  Alert and oriented 2, no acute distress  Cardiovascular: irregular rhythm, rate controlled  Respiratory: clear to auscultation bilaterally, poor inspiratory effort  Abdomen: soft, obese, nontender, positive bowel sounds  Musculoskeletal: no clubbing or cyanosis, trace edema   Data Reviewed: Basic Metabolic Panel:  Recent Labs Lab 03/11/14 1642 03/12/14 0630  NA 141 143  K 5.8* 5.5*  CL 106 111  CO2 14* 15*  GLUCOSE 83 71  BUN 137* 123*  CREATININE 4.63* 3.93*  CALCIUM 8.3* 8.0*   Liver Function Tests:  Recent Labs Lab 03/12/14 0630  AST 56*  ALT 39  ALKPHOS 142*  BILITOT 0.4  PROT 6.4  ALBUMIN 2.5*   No results for input(s): LIPASE, AMYLASE in the last 168 hours. No results for input(s): AMMONIA in the last 168 hours. CBC:  Recent Labs Lab 03/11/14 1642 03/12/14 0630  WBC 8.1 6.2  HGB 12.1* 11.1*  HCT 36.8* 33.6*  MCV 91.5 93.9  PLT 167 129*   Cardiac Enzymes:    Recent Labs Lab 03/11/14 1642  TROPONINI <0.30   BNP (last 3 results)  Recent Labs  03/11/14 1642  PROBNP 1497.0*   CBG:  Recent Labs Lab 03/11/14 2210 03/12/14 0013 03/12/14 0410 03/12/14 0737 03/12/14 1217  GLUCAP 106* 105* 96 79 82    Recent Results (from the past 240 hour(s))  MRSA PCR Screening     Status: Abnormal   Collection Time: 03/11/14  11:22 PM  Result Value Ref Range Status   MRSA by PCR POSITIVE (A) NEGATIVE Final    Comment:        The GeneXpert MRSA Assay (FDA approved for NASAL specimens only), is one component of a comprehensive MRSA colonization surveillance program. It is not intended to diagnose MRSA infection nor to guide or monitor treatment for MRSA infections. RESULT CALLED TO, READ BACK BY AND VERIFIED WITH: Ian BushmanD. DILLON,RN [id number] [facility]      Studies: Dg Chest 1 View  03/11/2014   CLINICAL DATA:  Cough  EXAM: CHEST - 1 VIEW  COMPARISON:  November 03, 2012  FINDINGS: There is atelectatic change in the left mid lung. There is stable interstitial prominence in the right mid lung, likely representing localized fibrosis. No frank edema or consolidation. Heart is enlarged with pulmonary vascularity within normal limits. No adenopathy. Patient is status post coronary artery bypass grafting.  IMPRESSION: Probable interstitial fibrosis right mid lung, stable. Mild atelectasis left mid lung. No edema or consolidation. Stable cardiomegaly.   Electronically Signed   By: Bretta BangWilliam  Woodruff M.D.   On: 03/11/2014 15:13   [place] Renal  03/11/2014   CLINICAL DATA:  AKI (acute kidney injury) N17.9 (ICD-10-CM).  EXAM: RENAL/URINARY TRACT ULTRASOUND COMPLETE  COMPARISON:  CT 09/03/2012  FINDINGS: Right Kidney: 12.5 cm. Interpolar 2.1 cm cyst. Mildly increased echogenicity and decreased cortical thickness. No hydronephrosis.  Left Kidney: 10.9 cm. No hydronephrosis. Mild renal cortical thinning and increased echogenicity.  Bladder:  Within normal limits.  IMPRESSION: 1.  No hydronephrosis. 2. Mildly increased renal echogenicity and decreased cortical thickness, suggesting medical renal disease.   Electronically Signed   By: Jeronimo GreavesKyle  Talbot M.D.   On: 03/11/2014 21:31   Dg Chest Portable 1 View  03/11/2014   CLINICAL DATA:  Shortness of breath, altered mental status, diaphoresis  EXAM: PORTABLE CHEST - 1 VIEW  COMPARISON:   03/11/2014  FINDINGS: Evidence of CABG reidentified. Moderate enlargement of the cardiac silhouette is reidentified. Diffusely prominent interstitial reticular opacities are noted. No new focal opacity. Lungs are hypoaerated with crowding of the bronchovascular markings. Cardiac leads are present. Trace pleural fluid.  IMPRESSION: Chronically prominent interstitial markings with low lung volumes in a pattern that may suggest usual interstitial pneumonitis but is nonspecific and would be better evaluated at dedicated high-resolution chest CT performed on a nonemergent basis.   Electronically Signed   By: Christiana PellantGretchen  Green M.D.   On: 03/11/2014 17:06    Scheduled Meds: . atorvastatin  20 mg Oral q morning - 10a  . [START ON 03/13/2014] Influenza vac split quadrivalent PF  0.5 mL Intramuscular Tomorrow-1000  . insulin aspart  0-9 Units Subcutaneous 6 times per day  . levETIRAcetam  500 mg Oral BID  . oxybutynin  5 mg Oral QHS  . [START ON 03/13/2014] pneumococcal 23 valent vaccine  0.5  mL Intramuscular Tomorrow-1000  . sodium chloride  3 mL Intravenous Q12H    Continuous Infusions: . dextrose 5 % and 0.9% NaCl Stopped (03/12/14 1418)  . heparin Stopped (03/12/14 1418)     Time spent: 35 minutes  Hollice Espy  [facility] Pager [phone]. If 7PM-7AM, please contact night-coverage at [email or web address], password [facility] 03/12/2014, 3:49 PM  LOS: 1 day

## 2014-03-13 DIAGNOSIS — N183 Chronic kidney disease, stage 3 (moderate): Secondary | ICD-10-CM

## 2014-03-13 LAB — URINE CULTURE
Colony Count: NO GROWTH
Culture: NO GROWTH

## 2014-03-13 LAB — BASIC METABOLIC PANEL
Anion gap: 14 (ref 5–15)
BUN: 98 mg/dL — ABNORMAL HIGH (ref 6–23)
CALCIUM: 8.1 mg/dL — AB (ref 8.4–10.5)
CO2: 14 meq/L — AB (ref 19–32)
Chloride: 113 mEq/L — ABNORMAL HIGH (ref 96–112)
Creatinine, Ser: 2.93 mg/dL — ABNORMAL HIGH (ref 0.50–1.35)
GFR calc Af Amer: 21 mL/min — ABNORMAL LOW (ref >90)
GFR calc non Af Amer: 18 mL/min — ABNORMAL LOW (ref >90)
Glucose, Bld: 126 mg/dL — ABNORMAL HIGH (ref 70–99)
Potassium: 4.8 mEq/L (ref 3.7–5.3)
Sodium: 141 mEq/L (ref 137–147)

## 2014-03-13 LAB — HEPARIN LEVEL (UNFRACTIONATED): Heparin Unfractionated: 1.6 IU/mL — ABNORMAL HIGH (ref 0.30–0.70)

## 2014-03-13 LAB — CBC
HEMATOCRIT: 32.9 % — AB (ref 39.0–52.0)
Hemoglobin: 10.8 g/dL — ABNORMAL LOW (ref 13.0–17.0)
MCH: 30.6 pg (ref 26.0–34.0)
MCHC: 32.8 g/dL (ref 30.0–36.0)
MCV: 93.2 fL (ref 78.0–100.0)
Platelets: 124 10*3/uL — ABNORMAL LOW (ref 150–400)
RBC: 3.53 MIL/uL — ABNORMAL LOW (ref 4.22–5.81)
RDW: 13.7 % (ref 11.5–15.5)
WBC: 6.1 10*3/uL (ref 4.0–10.5)

## 2014-03-13 LAB — GLUCOSE, CAPILLARY
GLUCOSE-CAPILLARY: 112 mg/dL — AB (ref 70–99)
GLUCOSE-CAPILLARY: 115 mg/dL — AB (ref 70–99)
Glucose-Capillary: 119 mg/dL — ABNORMAL HIGH (ref 70–99)
Glucose-Capillary: 124 mg/dL — ABNORMAL HIGH (ref 70–99)
Glucose-Capillary: 135 mg/dL — ABNORMAL HIGH (ref 70–99)
Glucose-Capillary: 133 mg/dL — ABNORMAL HIGH (ref 70–99)

## 2014-03-13 LAB — APTT: aPTT: 156 seconds — ABNORMAL HIGH (ref 24–37)

## 2014-03-13 LAB — OCCULT BLOOD X 1 CARD TO LAB, STOOL: Fecal Occult Bld: NEGATIVE

## 2014-03-13 MED ORDER — HEPARIN (PORCINE) IN NACL 100-0.45 UNIT/ML-% IJ SOLN
900.0000 [IU]/h | INTRAMUSCULAR | Status: DC
  Administered 2014-03-13: 900 [IU]/h via INTRAVENOUS
  Filled 2014-03-13 (×2): qty 250

## 2014-03-13 NOTE — Progress Notes (Signed)
ANTICOAGULATION CONSULT NOTE - Follow Up Consult  Pharmacy Consult for Heparin Indication: atrial fibrillation  No Known Allergies  Patient Measurements: Height: 6\' 1"  (185.4 cm) Weight: 217 lb 9.5 oz (98.7 kg) IBW/kg (Calculated) : 79.9 Heparin Dosing Weight: 92.4 kg  Vital Signs: Temp: 98.6 F (37 C) (11/15 0742) Temp Source: Oral (11/15 0742) BP: 111/62 mmHg (11/15 0742) Pulse Rate: 70 (11/15 0742)  Labs:  Recent Labs  03/11/14 1642 03/12/14 0630 03/12/14 1917 03/13/14 0245  HGB 12.1* 11.1*  --  10.8*  HCT 36.8* 33.6*  --  32.9*  PLT 167 129*  --  124*  APTT  --  171* 63* 156*  LABPROT  --  19.8*  --   --   INR  --  1.66*  --   --   HEPARINUNFRC  --  >2.20*  --  1.60*  CREATININE 4.63* 3.93*  --  2.93*  TROPONINI <0.30  --   --   --     Estimated Creatinine Clearance: 22.8 mL/min (by C-G formula based on Cr of 2.93).   Assessment: Taylor Jacobson with history of Afib on Eliquis PTA.  Heparin was held for 1 hour this morning and the rate was decreased to 900 units/hr.  Nursing informed me that patient appears to have blood in stool. Discontinued heparin and paged Dr. Rito EhrlichKrishnan.  Platelets are low but stable and Hgb has been trending down gradually.    Plan:  1. Hold heparin in setting of GI bleed 2. F/U plans to restart heparin 3. Monitor CBC  Russ HaloAshley Tagg Eustice, PharmD Clinical Pharmacist - Resident Pager: [phone] 11/15/201511:55 AM

## 2014-03-13 NOTE — Progress Notes (Signed)
ANTICOAGULATION CONSULT NOTE  Pharmacy Consult for Heparin Indication: atrial fibrillation  No Known Allergies  Patient Measurements: Height: 6\' 1"  (185.4 cm) Weight: 217 lb 9.5 oz (98.7 kg) IBW/kg (Calculated) : 79.9 Heparin Dosing Weight: 92.4 kg  Vital Signs: Temp: 97.9 F (36.6 C) (11/15 0350) Temp Source: Oral (11/15 0350) BP: 103/41 mmHg (11/15 0350) Pulse Rate: 70 (11/15 0350)  Labs:  Recent Labs  03/11/14 1642 03/12/14 0630 03/12/14 1917 03/13/14 0245  HGB 12.1* 11.1*  --  10.8*  HCT 36.8* 33.6*  --  32.9*  PLT 167 129*  --  124*  APTT  --  171* 63* 156*  LABPROT  --  19.8*  --   --   INR  --  1.66*  --   --   HEPARINUNFRC  --  >2.20*  --  1.60*  CREATININE 4.63* 3.93*  --  2.93*  TROPONINI <0.30  --   --   --     Estimated Creatinine Clearance: 22.8 mL/min (by C-G formula based on Cr of 2.93).   Assessment: [age] yoM with history of Afib, Eliquis on hold, for heparin.  Residual Eliquis still elevating heparin level.  Goal of Therapy:  Heparin level 0.3-0.7 units/ml aPTT 66-102 seconds Monitor platelets by anticoagulation protocol: Yes   Plan:  Hold heparin x 1 hr, then decrease heparin 900 units/hr Check heparin level in 8 hours.   Geannie RisenGreg Quanisha Drewry, PharmD, BCPS  11/15/20156:33 AM

## 2014-03-13 NOTE — Progress Notes (Signed)
Pt tx 6E per MD order, pt VSS, pt confused, pt's daughter called to update tx, pt daughter verbalized understanding of tx, pt with BM, hemoccult card done, report called to receiving RN, [facility]

## 2014-03-13 NOTE — Progress Notes (Signed)
Pharmacist Heart Failure Core Measure Documentation  Assessment: Taylor Jacobson has an EF documented as 25% by Echo 2014.  Rationale: Heart failure patients with left ventricular systolic dysfunction (LVSD) and an EF < 40% should be prescribed an angiotensin converting enzyme inhibitor (ACEI) or angiotensin receptor blocker (ARB) at discharge unless a contraindication is documented in the medical record.  This patient is not currently on an ACEI or ARB for HF.  This note is being placed in the record in order to provide documentation that a contraindication to the use of these agents is present for this encounter.  ACE Inhibitor or Angiotensin Receptor Blocker is contraindicated (specify all that apply)  []   ACEI allergy AND ARB allergy []   Angioedema []   Moderate or severe aortic stenosis []   Hyperkalemia []   Hypotension []   Renal artery stenosis [x]   Worsening renal function, preexisting renal disease or dysfunction   Sallyann Kinnaird S. Merilynn Finlandobertson, PharmD, BCPS Clinical Staff Pharmacist Pager [phone]  Misty StanleyRobertson, Demarques Pilz [facility] 03/13/2014 2:35 PM

## 2014-03-13 NOTE — Progress Notes (Signed)
Admission note:  Arrival Method: bed  Mental Orientation:alert & oriented to person and time Telemetry: box #12 applied and CCMD notified Assessment: completed Skin:foam to sacrum skin red but blanchable; foam to left shoulder d/t skin tear   IV: right AC  Pain: pt denies  Tubes: N/A Admission: Completed and admission orders have been written  6E Orientation: Patient has been oriented to the unit, staff and to the room.

## 2014-03-13 NOTE — Progress Notes (Signed)
PROGRESS NOTE  Taylor Jacobson AOZ:[id number] DOB: 1927/02/11 DOA: 03/11/2014 PCP: Rudi HeapMOORE, DONALD, MD  HPI/Recap of past 2724 hours: 80 year old white male with past medical history of diabetes, systolic congestive heart failure with EF of 25%, atrial fib/flutter and chronic kidney disease and baseline creatinine of 2 admitted on 11/13 for increasing fatigue and confusion for the past few weeks and found to be in acute renal failure with a creatinine of 4.63 and a BUN of 137.  Patient also noted to have a hemoglobin of 12-baseline around 14  Blood pressures have been more stable in the last day and a half.creatinine down to 2.9 and hemoglobin at 10.8. Noted to be Hemoccult positive. Patient himself, although speech a little garbled, looks to be in no acute distress.  Assessment/Plan:    Diabetes mellitus:A1c stable and just checked 3 months prior at 7.1. Watching CBGs closely.   Coronary atherosclerosis: Stable    Altered mental status: at little bit more alert and oriented and interactive, in part due to improving renal function    Acute kidney injury in the setting of CKD (chronic kidney disease) stage 3, GFR 30-59 ml/min: Responding to IV fluids. Given markedly elevated BUN, this is prerenal and possibly from GI bleed. See below.renal function continues to improve    Anemia of unknown etiology: I'm strongly concerned about the possibility of GI bleed. Stool noted to be Hemoccult positive. His baseline is around 14 and already low, and given markedly elevated BUN, I suspect that his hemoglobin will drop much further. His blood pressures have been soft. Given little drop since yesterday, more comfortable with moving him upstairs  Chronic systolic heart failure: Gently hydrating  Atrial fibrillation/flutter: Rate controlled. INR subtherapeutic and currently is on heparin.heparin on hold due to concerns for GI bleed, likely will restart tomorrow if hemoglobin does not drop  significantly  Urinary retention: Unable to void. Have placed Foley catheter. This could be another cause of his renal failure   Code Status: DO NOT RESUSCITATE  Family Communication: spoke with daughter by phone  Disposition Plan: transfer to floor. Discharge once confirmed no GI bleed and renal function back to baseline, likely in next few days   Consultants:  none  Procedures:  none  Antibiotics:  none   Objective: BP 111/62 mmHg  Pulse 70  Temp(Src) 98.6 F (37 C) (Oral)  Resp 16  Ht 6\' 1"  (1.854 m)  Wt 98.7 kg (217 lb 9.5 oz)  BMI 28.71 kg/m2  SpO2 100%  Intake/Output Summary (Last 24 hours) at 03/13/14 1242 Last data filed at 03/13/14 1135  Gross per 24 hour  Intake 1965.48 ml  Output   2975 ml  Net -1009.52 ml   Filed Weights   03/11/14 2243 03/13/14 0350  Weight: 99.8 kg (220 lb 0.3 oz) 98.7 kg (217 lb 9.5 oz)    Exam:   General:  Alert and oriented 2, no acute distress  Cardiovascular: irregular rhythm, rate controlled  Respiratory: clear to auscultation bilaterally, better inspiratory effort  Abdomen: soft, obese, nontender, positive bowel sounds  Musculoskeletal: no clubbing or cyanosis, trace edema   Data Reviewed: Basic Metabolic Panel:  Recent Labs Lab 03/11/14 1642 03/12/14 0630 03/13/14 0245  NA 141 143 141  K 5.8* 5.5* 4.8  CL 106 111 113*  CO2 14* 15* 14*  GLUCOSE 83 71 126*  BUN 137* 123* 98*  CREATININE 4.63* 3.93* 2.93*  CALCIUM 8.3* 8.0* 8.1*   Liver Function Tests:  Recent Labs Lab 03/12/14  0630  AST 56*  ALT 39  ALKPHOS 142*  BILITOT 0.4  PROT 6.4  ALBUMIN 2.5*   No results for input(s): LIPASE, AMYLASE in the last 168 hours. No results for input(s): AMMONIA in the last 168 hours. CBC:  Recent Labs Lab 03/11/14 1642 03/12/14 0630 03/13/14 0245  WBC 8.1 6.2 6.1  HGB 12.1* 11.1* 10.8*  HCT 36.8* 33.6* 32.9*  MCV 91.5 93.9 93.2  PLT 167 129* 124*   Cardiac Enzymes:    Recent Labs Lab  03/11/14 1642  TROPONINI <0.30   BNP (last 3 results)  Recent Labs  03/11/14 1642  PROBNP 1497.0*   CBG:  Recent Labs Lab 03/12/14 1922 03/12/14 2308 03/13/14 0434 03/13/14 0743 03/13/14 1210  GLUCAP 108* 112* 119* 115* 124*    Recent Results (from the past 240 hour(s))  Urine culture     Status: None   Collection Time: 03/11/14  5:38 PM  Result Value Ref Range Status   Specimen Description URINE, CATHETERIZED  Final   Special Requests ADDED [phone]  Final   Culture  Setup Time   Final    03/12/2014 01:38 [facility]formed [facility]    Colony Count NO GROWTH [facility]formed at [facility]   Final   Culture NO GROWTH Performed at [facility]   Final   Report Status 03/13/2014 FINAL  Final  MRSA PCR Screening     Status: Abnormal   Collection Time: 03/11/14 11:22 PM  Result Value Ref Range Status   MRSA by PCR POSITIVE (A) NEGATIVE Final    Comment:        The GeneXpert MRSA Assay (FDA approved for NASAL specimens only), is one component of a comprehensive MRSA colonization surveillance program. It is not intended to diagnose MRSA infection nor to guide or monitor treatment for MRSA infections. RESULT CALLED TO, READ BACK BY AND VERIFIED WITH: Ian Bushman. DILLON,RN [id number]      Studies: No results found.  Scheduled Meds: . antiseptic oral rinse  7 mL Mouth Rinse BID  . atorvastatin  20 mg Oral q morning - 10a  . Chlorhexidine Gluconate Cloth  6 each Topical Q0600  . Influenza vac split quadrivalent PF  0.5 mL Intramuscular Tomorrow-1000  . insulin aspart  0-9 Units Subcutaneous 6 times per day  . levETIRAcetam  500 mg Oral BID  . mupirocin ointment  1 application Nasal BID  . oxybutynin  5 mg Oral QHS  . pneumococcal 23 valent vaccine  0.5 mL Intramuscular Tomorrow-1000  . sodium chloride  3 mL Intravenous Q12H    Continuous Infusions: . dextrose 5 % and 0.9% NaCl 75 mL/hr at 03/13/14 [id number]     Time spent: 25  minutes  Hollice EspyKRISHNAN,Kenli Waldo K  [facility] Pager [phone]. If 7PM-7AM, please contact night-coverage at [email or web address], password [email or web address] 03/13/2014, 12:42 PM  LOS: 2 days

## 2014-03-14 DIAGNOSIS — I4891 Unspecified atrial fibrillation: Secondary | ICD-10-CM

## 2014-03-14 DIAGNOSIS — D649 Anemia, unspecified: Secondary | ICD-10-CM

## 2014-03-14 DIAGNOSIS — N179 Acute kidney failure, unspecified: Secondary | ICD-10-CM | POA: Diagnosis not present

## 2014-03-14 DIAGNOSIS — I495 Sick sinus syndrome: Secondary | ICD-10-CM

## 2014-03-14 LAB — CBC
HEMATOCRIT: 32.2 % — AB (ref 39.0–52.0)
HEMOGLOBIN: 10.4 g/dL — AB (ref 13.0–17.0)
MCH: 31 pg (ref 26.0–34.0)
MCHC: 32.3 g/dL (ref 30.0–36.0)
MCV: 95.8 fL (ref 78.0–100.0)
Platelets: 110 10*3/uL — ABNORMAL LOW (ref 150–400)
RBC: 3.36 MIL/uL — AB (ref 4.22–5.81)
RDW: 13.9 % (ref 11.5–15.5)
WBC: 5.4 10*3/uL (ref 4.0–10.5)

## 2014-03-14 LAB — BASIC METABOLIC PANEL
Anion gap: 11 (ref 5–15)
BUN: 65 mg/dL — AB (ref 6–23)
CHLORIDE: 120 meq/L — AB (ref 96–112)
CO2: 18 meq/L — AB (ref 19–32)
CREATININE: 2.2 mg/dL — AB (ref 0.50–1.35)
Calcium: 8.4 mg/dL (ref 8.4–10.5)
GFR calc Af Amer: 30 mL/min — ABNORMAL LOW (ref >90)
GFR calc non Af Amer: 26 mL/min — ABNORMAL LOW (ref >90)
GLUCOSE: 108 mg/dL — AB (ref 70–99)
Potassium: 4.8 mEq/L (ref 3.7–5.3)
Sodium: 149 mEq/L — ABNORMAL HIGH (ref 137–147)

## 2014-03-14 LAB — GLUCOSE, CAPILLARY
GLUCOSE-CAPILLARY: 106 mg/dL — AB (ref 70–99)
GLUCOSE-CAPILLARY: 93 mg/dL (ref 70–99)
GLUCOSE-CAPILLARY: 153 mg/dL — AB (ref 70–99)
Glucose-Capillary: 108 mg/dL — ABNORMAL HIGH (ref 70–99)
Glucose-Capillary: 121 mg/dL — ABNORMAL HIGH (ref 70–99)

## 2014-03-14 MED ORDER — ACETAMINOPHEN 325 MG PO TABS
650.0000 mg | ORAL_TABLET | Freq: Four times a day (QID) | ORAL | Status: DC | PRN
  Administered 2014-03-14: 650 mg via ORAL
  Filled 2014-03-14: qty 2

## 2014-03-14 MED ORDER — NEPRO/CARBSTEADY PO LIQD
237.0000 mL | Freq: Two times a day (BID) | ORAL | Status: DC
  Administered 2014-03-14 – 2014-03-17 (×7): 237 mL via ORAL

## 2014-03-14 MED ORDER — DEXTROSE-NACL 5-0.45 % IV SOLN
INTRAVENOUS | Status: DC
  Administered 2014-03-14 (×2): via INTRAVENOUS

## 2014-03-14 NOTE — Evaluation (Signed)
Occupational Therapy Evaluation Patient Details Name: Taylor Jacobson [name] MRN: [id number] DOB: 02-Sep-1926 Today's Date: 03/14/2014    History of Present Illness Pt is an 80 y/o male with a PMH of DM, CKD, HTN, CHF, obesity, a-fib, CAD s/p CABG. Pt presented to the ED with generalized fatigue and confusion for 2 weeks PTA. He was seen by his PCP and sent to the ED. He was admitted for AKI on CKD.    Clinical Impression   This 80 yo male admitted with above presents to acute OT with increased confusion, decreased ability to communicate effectively, decreased mobility, decreased balance, apparent pain due to grimace with sit<>stand all affecting his ability to care for himself and increasing apparent burden of care on family. Pt will benefit from acute OT with follow up OT at SNF.    Follow Up Recommendations  SNF    Equipment Recommendations   (TBD at next venue)       Precautions / Restrictions Precautions Precautions: Fall Restrictions Weight Bearing Restrictions: No      Mobility Bed Mobility Overal bed mobility: Needs Assistance Bed Mobility: Rolling;Sidelying to Sit Rolling: Min assist Sidelying to sit: Mod assist       General bed mobility comments: Pt up in recliner upon admission  Transfers Overall transfer level: Needs assistance Equipment used: Rolling walker (2 wheeled) Transfers: Sit to/from [employer] CorporationStand;Stand Pivot Transfers Sit to Stand: Mod assist Stand pivot transfers: Mod assist          Balance Overall balance assessment: Needs assistance Sitting-balance support: Feet supported;No upper extremity supported Sitting balance-Leahy Scale: Poor Sitting balance - Comments: Requires UE support to maintain foreward seated balance in recliner   Standing balance support: Bilateral upper extremity supported Standing balance-Leahy Scale: Poor Standing balance comment: Requires UE support and PT assist to maintain standing balance.                              ADL Overall ADL's : Needs assistance/impaired Eating/Feeding: Moderate assistance;Sitting   Grooming: Moderate assistance;Sitting;Cueing for sequencing   Upper Body Bathing: Moderate assistance;Cueing for sequencing;Sitting   Lower Body Bathing: Maximal assistance;Cueing for sequencing (with Mod A sit<>stand)   Upper Body Dressing : Maximal assistance;Cueing for sequencing;Sitting   Lower Body Dressing: Total assistance;Cueing for sequencing (with Mod A sit<>stand)   Toilet Transfer: Moderate assistance;Ambulation;RW (reclienr turn to bed going to pt's left)   Toileting- Clothing Manipulation and Hygiene: Total assistance (with Mod A sit<>stand)                         Pertinent Vitals/Pain Pain Assessment: Faces Faces Pain Scale: Hurts even more Pain Location: Pt leaned forward to start to stand up and grimaced/moaned asked him where it hurt and he pointed to his right knee. As we sat back down he did the same thing so I asked again if he had pain and where, but he did not answer, I tried touching his right knee and asking the same thing and he said no Pain Intervention(s): Monitored during session;Repositioned        Extremity/Trunk Assessment Upper Extremity Assessment Upper Extremity Assessment: RUE deficits/detail;LUE deficits/detail RUE Deficits / Details: Decreased shoulder AROM RUE Coordination: decreased gross motor LUE Deficits / Details: Decreased shoulder AROM LUE Coordination: decreased gross motor      Communication Communication Communication: Expressive difficulties (rambling, tangental, mostly unintelligible thoughts)   Cognition Arousal/Alertness: Awake/alert Behavior During Therapy: Flat affect  Overall Cognitive Status: Impaired/Different from baseline Area of Impairment: Orientation;Memory;Following commands;Safety/judgement;Awareness;Problem solving Orientation Level: Disoriented to;Place;Time;Situation   Memory: Decreased short-term  memory Following Commands: Follows one step commands inconsistently;Follows one step commands with increased time Safety/Judgement: Decreased awareness of safety;Decreased awareness of deficits Awareness: Emergent Problem Solving: Slow processing;Decreased initiation;Difficulty sequencing;Requires verbal cues;Requires tactile cues General Comments: Communication was difficult during session due to unintelligible speech              Home Living Family/patient expects to be discharged to:: Skilled nursing facility Living Arrangements: Spouse/significant other;Children              Prior Functioning/Environment          Comments: PLOF unclear at this time. Pt unable to provide a reliable history.     OT Diagnosis: Generalized weakness;Cognitive deficits   OT Problem List: Decreased strength;Decreased range of motion;Decreased activity tolerance;Impaired balance (sitting and/or standing);Pain;Impaired UE functional use;Decreased cognition;Obesity;Decreased knowledge of use of DME or AE   OT Treatment/Interventions: Self-care/ADL training;Patient/family education;Balance training;Cognitive remediation/compensation;DME and/or AE instruction    OT Goals(Current goals can be found in the care plan section) Acute Rehab OT Goals Patient Stated Goal: Pt unable to state due to cognitive issues OT Goal Formulation: Patient unable to participate in goal setting Time For Goal Achievement: 03/28/14 Potential to Achieve Goals: Fair (depending on cognition clearing)  OT Frequency: Min 2X/week              End of Session Equipment Utilized During Treatment: Gait belt;Rolling walker Nurse Communication:  (Asked if pt could have diet ginger ale)  Activity Tolerance:  (limited by cognition) Patient left: in chair;with call bell/phone within reach;with chair alarm set   Time: [id number] OT Time Calculation (min): 14 min Charges:  OT General Charges $OT Visit: 1 Procedure OT  Evaluation $Initial OT Evaluation Tier I: 1 Procedure OT Treatments $Self Care/Home Management : 8-22 mins  Taylor Jacobson, Taylor Jacobson Eva [phone] 03/14/2014, 11:13 AM

## 2014-03-14 NOTE — Progress Notes (Addendum)
PROGRESS NOTE  Clover Mealyarl Hubert Plazola ZOX:[id number] DOB: [birth year] DOA: 03/11/2014 PCP: Rudi HeapMOORE, DONALD, MD  HPI/Recap of past 5324 hours: 80 year old white male with past medical history of diabetes, systolic congestive heart failure with EF of 25%, atrial fib/flutter and chronic kidney disease and baseline creatinine of 2 admitted on 11/13 for increasing fatigue and confusion for the past few weeks and found to be in acute renal failure with a creatinine of 4.63 and a BUN of 137.  Patient also noted to have a hemoglobin of 12-baseline around 14  After initial day, patient's blood pressures have stabilized. His creatinine continues to improve and his hemoglobin while decreasing, has been at a very slow rate. He was noted to be mildly heme positive and heparin on hold. Patient transferred to floor on 11/15.  In early morning of 11/16, patient had 5 second pause. Asymptomatic. Seen by cardiology who recommended monitoring and no pacemaker at this time  Today patient is doing well. Denies any shortness of breath. He has occasional word finding difficulty, but is rather appropriate and interactive. Does not seem to be in any kind of distress  Assessment/Plan:    Diabetes mellitus:A1c stable and just checked 3 months prior at 7.1. Watching CBGs closely.   Coronary atherosclerosis: Stable   acute encephalopathy: looks to be resolved. Suspect from dehydration and initial uremia    Acute kidney injury in the setting of CKD (chronic kidney disease) stage 3, GFR 30-59 ml/min: Responding to IV fluids. Given markedly elevated BUN, this is prerenal and possibly from GI bleed. See below.renal function continues to improve    Anemia of unknown etiology: I'm strongly concerned about the possibility of GI bleed. Stool noted to be Hemoccult positive. His baseline is around 14 and already low, and given markedly elevated BUN, I suspect that his hemoglobin will somewhat further.after several days, only down to 10.4.  Given positive Hemoccult, have stopped heparin and on SCD. Restart eliquis once renal function at baseline  Chronic systolic heart failure: Gently hydrating  Atrial fibrillation/flutter: Rate controlled. INR subtherapeutic and currently is on heparin.heparin on hold due to concerns for GI bleed, Will start eliquis soon  Urinary retention: Unable to void. Have placed Foley catheter. This could be another cause of his renal failure. Discontinue Foley catheter tomorrow.  Hyponatremia: Changed IV fluids to half-normal saline  Arrhythmia: As mentioned above. Cardiology has seen, avoiding nodal agents. No aggressive measures such as pacemaker. Continue to monitor.   Code Status: DO NOT RESUSCITATE  Family Communication: spoke with daughter by phone  Disposition Plan: continues to improve. We'll discuss with family about PT/OT recommendation for short-term skilled nursing   Consultants:  none  Procedures:  none  Antibiotics:  none   Objective: BP 128/71 mmHg  Pulse 72  Temp(Src) 97.9 F (36.6 C) (Oral)  Resp 19  Ht 6\' 1"  (1.854 m)  Wt 99.8 kg (220 lb 0.3 oz)  BMI 29.03 kg/m2  SpO2 100%  Intake/Output Summary (Last 24 hours) at 03/14/14 1319 Last data filed at 03/14/14 0700  Gross per 24 hour  Intake    140 ml  Output   1350 ml  Net  -1210 ml   Filed Weights   03/11/14 2243 03/13/14 0350 03/13/14 2013  Weight: 99.8 kg (220 lb 0.3 oz) 98.7 kg (217 lb 9.5 oz) 99.8 kg (220 lb 0.3 oz)    Exam:   General:  Alert and oriented 2, no acute distress  Cardiovascular: irregular rhythm, rate controlled  Respiratory: clear  to auscultation bilaterally, no rales  Abdomen: soft, obese, nontender, positive bowel sounds  Musculoskeletal: no clubbing or cyanosis, trace edema   Data Reviewed: Basic Metabolic Panel:  Recent Labs Lab 03/11/14 1642 03/12/14 0630 03/13/14 0245 03/14/14 0448  NA 141 143 141 149*  K 5.8* 5.5* 4.8 4.8  CL 106 111 113* 120*  CO2 14* 15*  14* 18*  GLUCOSE 83 71 126* 108*  BUN 137* 123* 98* 65*  CREATININE 4.63* 3.93* 2.93* 2.20*  CALCIUM 8.3* 8.0* 8.1* 8.4   Liver Function Tests:  Recent Labs Lab 03/12/14 0630  AST 56*  ALT 39  ALKPHOS 142*  BILITOT 0.4  PROT 6.4  ALBUMIN 2.5*   No results for input(s): LIPASE, AMYLASE in the last 168 hours. No results for input(s): AMMONIA in the last 168 hours. CBC:  Recent Labs Lab 03/11/14 1642 03/12/14 0630 03/13/14 0245 03/14/14 0448  WBC 8.1 6.2 6.1 5.4  HGB 12.1* 11.1* 10.8* 10.4*  HCT 36.8* 33.6* 32.9* 32.2*  MCV 91.5 93.9 93.2 95.8  PLT 167 129* 124* 110*   Cardiac Enzymes:    Recent Labs Lab 03/11/14 1642  TROPONINI <0.30   BNP (last 3 results)  Recent Labs  03/11/14 1642  PROBNP 1497.0*   CBG:  Recent Labs Lab 03/13/14 2017 03/14/14 0032 03/14/14 0405 03/14/14 0807 03/14/14 1221  GLUCAP 133* 108* 106* 93 121*    Recent Results (from the past 240 hour(s))  Urine culture     Status: None   Collection Time: 03/11/14  5:38 PM  Result Value Ref Range Status   Specimen Description URINE, CATHETERIZED  Final   Special Requests ADDED [id number]  Final   Culture  Setup Time   Final    03/12/2014 01:38 [facility]    Colony Count NO GROWTH [facility]formed at [facility]   Final   Culture NO GROWTH Performed at [facility]   Final   Report Status 03/13/2014 FINAL  Final  MRSA PCR Screening     Status: Abnormal   Collection Time: 03/11/14 11:22 PM  Result Value Ref Range Status   MRSA by PCR POSITIVE (A) NEGATIVE Final    Comment:        The GeneXpert MRSA Assay (FDA approved for NASAL specimens only), is one component of a comprehensive MRSA colonization surveillance program. It is not intended to diagnose MRSA infection nor to guide or monitor treatment for MRSA infections. RESULT CALLED TO, READ BACK BY AND VERIFIED WITH: Ian Bushman. DILLON,RN [id number] [facility]      Studies: No results  found.  Scheduled Meds: . antiseptic oral rinse  7 mL Mouth Rinse BID  . atorvastatin  20 mg Oral q morning - 10a  . Chlorhexidine Gluconate Cloth  6 each Topical Q0600  . Influenza vac split quadrivalent PF  0.5 mL Intramuscular Tomorrow-1000  . insulin aspart  0-9 Units Subcutaneous 6 times per day  . levETIRAcetam  500 mg Oral BID  . mupirocin ointment  1 application Nasal BID  . oxybutynin  5 mg Oral QHS  . pneumococcal 23 valent vaccine  0.5 mL Intramuscular Tomorrow-1000  . sodium chloride  3 mL Intravenous Q12H    Continuous Infusions: . dextrose 5 % and 0.45% NaCl 75 mL/hr at 03/14/14 0849     Time spent: 25 minutes  Hollice EspyKRISHNAN,Shakora Nordquist K  [facility] Pager [phone]. If 7PM-7AM, please contact night-coverage at [email or web address], password [email or web address] 03/14/2014, 1:19 PM  LOS: 3 days

## 2014-03-14 NOTE — Plan of Care (Signed)
Problem: Phase I Progression Outcomes Goal: Initial discharge plan identified Outcome: Completed/Met Date Met:  03/14/14

## 2014-03-14 NOTE — Care Management Note (Signed)
CARE MANAGEMENT NOTE 03/14/2014  Patient:  Taylor Jacobson,Taylor Jacobson   Account Number:  [id number]  Date Initiated:  03/14/2014  Documentation initiated by:  Johny ShockOYAL,Alyn Riedinger  Subjective/Objective Assessment:   CM following for progression and d/c planning.     Action/Plan:   Noted CM consult, will follow for PT/OT eval and recommendations.   Anticipated DC Date:     Anticipated DC Plan:  SKILLED NURSING FACILITY         Choice offered to / List presented to:             Status of service:  In process, will continue to follow Medicare Important Message given?   (If response is "NO", the following Medicare IM given date fields will be blank) Date Medicare IM given:   Medicare IM given by:   Date Additional Medicare IM given:   Additional Medicare IM given by:    Discharge Disposition:    Per UR Regulation:    If discussed at Long Length of Stay Meetings, dates discussed:    Comments:

## 2014-03-14 NOTE — Progress Notes (Signed)
CARE MANAGEMENT NOTE 03/14/2014  Patient:  Clover MealyJONES,Wells HUBERT   Account Number:  [id number]  Date Initiated:  03/14/2014  Documentation initiated by:  Johny ShockOYAL,CHERYL  Subjective/Objective Assessment:   CM following for progression and d/c planning.     Action/Plan:   Noted CM consult, will follow for PT/OT eval and recommendations.   Anticipated DC Date:  03/16/2014   Anticipated DC Plan:  SKILLED NURSING FACILITY      DC Planning Services  CM consult      Choice offered to / List presented to:             Status of service:  Completed, signed off Medicare Important Message given?  YES (If response is "NO", the following Medicare IM given date fields will be blank) Date Medicare IM given:  03/14/2014 Medicare IM given by:  [facility]AVIS,Princessa Lesmeister Date Additional Medicare IM given:   Additional Medicare IM given by:    Discharge Disposition:  SKILLED NURSING FACILITY  Per UR Regulation:    If discussed at Long Length of Stay Meetings, dates discussed:    Comments:  03/14/2014 Chart reviewed. Isidoro DonningAlesia Helder Crisafulli RN CCM Case Mgmt

## 2014-03-14 NOTE — Progress Notes (Signed)
Pt asystole pause of 5.3 seconds. Pt sleeping; easily aroused. Asymptomatic.  Notified on call, Benedetto Coons. Callahan, NP. No new orders received at this time. Will continue to monitor.

## 2014-03-14 NOTE — Progress Notes (Signed)
INITIAL NUTRITION ASSESSMENT  DOCUMENTATION CODES Per approved criteria  -Not Applicable   INTERVENTION: Provide Nepro Shake po BID, each supplement provides 425 kcal and 19 grams protein.  Encourage adequate PO intake.  NUTRITION DIAGNOSIS: Increased nutrient needs related to acute kidney injury as evidenced by estimated nutrition needs.   Goal: Pt to meet >/= 90% of their estimated nutrition needs   Monitor:  PO intake, weight trends, labs, I/O's  Reason for Assessment: Low Braden Score  80 y.o. male  Admitting Dx: Acute renal failure  ASSESSMENT: hx of DM, CKD, HTN, ischemic CMP and CHF, Obesity, afib on Eliquis, CAD s/p CABG, presented to the ER with generalized fatigue and confusion for the past couple of week. Hospitalist was asked to admit him for AKI on CKD.  Meal completion is 5%. Family was at bedside. Pt and family reports pt has been eating well PTA at home with no difficulties. Family reports pt has been confused since admission. Per Epic weight records, pt's weight has been stable. Pt is agreeable to Nepro Shake to help aid in calorie and protein intake since meal completion has been poor. Will order. Pt was encouraged to eat his food at meals and to drink his supplements.   Pt with no observed significant fat or muscle mass loss.  Labs: Low CO2, and GFR. High sodium, chloride, BUN, and creatinine.  Height: Ht Readings from Last 1 Encounters:  03/11/14 6\' 1"  (1.854 m)    Weight: Wt Readings from Last 1 Encounters:  03/13/14 220 lb 0.3 oz (99.8 kg)    Ideal Body Weight: 184 lbs  % Ideal Body Weight: 120%  Wt Readings from Last 10 Encounters:  03/13/14 220 lb 0.3 oz (99.8 kg)  02/22/14 226 lb (102.513 kg)  01/04/14 229 lb (103.874 kg)  12/28/13 230 lb (104.327 kg)  12/05/13 230 lb (104.327 kg)  11/29/13 237 lb (107.502 kg)  07/14/13 220 lb (99.791 kg)  03/09/13 223 lb (101.152 kg)  12/09/12 221 lb (100.245 kg)  12/07/12 219 lb (99.338 kg)     Usual Body Weight: 225 lbs  % Usual Body Weight: 98%  BMI:  Body mass index is 29.03 kg/(m^2).  Estimated Nutritional Needs: Kcal: 2000-2200 Protein: 110-120 grams Fluid: 1.2 L/day  Skin: Stage I pressure ulcer on sacrum  Diet Order: Diet renal/carb modified with 1200 ml fluid restriction  EDUCATION NEEDS: -Education not appropriate at this time   Intake/Output Summary (Last 24 hours) at 03/14/14 0959 Last data filed at 03/14/14 0700  Gross per 24 hour  Intake    140 ml  Output   2050 ml  Net  -1910 ml    Last BM: 11/15  Labs:   Recent Labs Lab 03/12/14 0630 03/13/14 0245 03/14/14 0448  NA 143 141 149*  K 5.5* 4.8 4.8  CL 111 113* 120*  CO2 15* 14* 18*  BUN 123* 98* 65*  CREATININE 3.93* 2.93* 2.20*  CALCIUM 8.0* 8.1* 8.4  GLUCOSE 71 126* 108*    CBG (last 3)   Recent Labs  03/14/14 0032 03/14/14 0405 03/14/14 0807  GLUCAP 108* 106* 93    Scheduled Meds: . antiseptic oral rinse  7 mL Mouth Rinse BID  . atorvastatin  20 mg Oral q morning - 10a  . Chlorhexidine Gluconate Cloth  6 each Topical Q0600  . Influenza vac split quadrivalent PF  0.5 mL Intramuscular Tomorrow-1000  . insulin aspart  0-9 Units Subcutaneous 6 times per day  . levETIRAcetam  500 mg  Oral BID  . mupirocin ointment  1 application Nasal BID  . oxybutynin  5 mg Oral QHS  . pneumococcal 23 valent vaccine  0.5 mL Intramuscular Tomorrow-1000  . sodium chloride  3 mL Intravenous Q12H    Continuous Infusions: . dextrose 5 % and 0.45% NaCl 75 mL/hr at 03/14/14 0849    Past Medical History  Diagnosis Date  . CAD (coronary artery disease)     a. 1997 s/p CABG x 4: LIMA->LAD, VG->Diag, VG->OM, VG->dRCA;  b. 07/2000 Cath: LM nl, LAD 5573m, RI 99p, LCX 100p, RCA 90, VG->RCA nl, VG->OM nl, VG->Diag nl, LIMA->LAD nl.  . Diabetes   . Chronic systolic CHF (congestive heart failure)     a. 07/2012 Echo: EF 25-30%, diff HK, triv AI, mild MR, mod dil LA, mod reduced RV syst fxn, mildly  dil RA.  . Cardiomyopathy, ischemic   . Obesity   . HTN (hypertension), benign   . Hyperlipidemia   . Subdural hematoma     a. 2009.  Marland Kitchen. OSA (obstructive sleep apnea)     a. uses CPAP  . Atrial fibrillation     a. Eliquis started 06/2012.  Marland Kitchen. A-fib     Past Surgical History  Procedure Laterality Date  . Coronary artery bypass graft      1997 L - LAD, SVG D1, SVG OM, SVG RCA.  Grafts patent on cath 2002  . Hemorrhoid surgery    . Cholecystectomy    . Bilateral total knee replacements    . Nasal septum surgery      Marijean NiemannStephanie La, MS, RD, LDN Pager # [phone] After hours/ weekend pager # [phone]

## 2014-03-14 NOTE — Consult Note (Addendum)
HPI: Taylor Jacobson is an [age]M with chronic systolic heart failure EF 25%, DM, atrial fibrillation/flutter, and CKD (baseline creatinine 2) admitted with acute on chronic renal failure.  Taylor Jacobson is unable to provide any history due to confusion that is thought to be due to his acute renal failure.  Cardiology was consulted for a 5.3 second pause while sleeping.  The nursing staff was able to awaken Taylor Jacobson to his baseline mental state.  Taylor Jacobson has not been on any nodal agents.  His heart rate in atrial fibrillation has been well-controlled.  He denies CP or SOB.      Review of Systems: Unable to obtain     Past Medical History  Diagnosis Date  . CAD (coronary artery disease)     a. 1997 s/p CABG x 4: LIMA->LAD, VG->Diag, VG->OM, VG->dRCA;  b. 07/2000 Cath: LM nl, LAD 1970m, RI 99p, LCX 100p, RCA 90, VG->RCA nl, VG->OM nl, VG->Diag nl, LIMA->LAD nl.  . Diabetes   . Chronic systolic CHF (congestive heart failure)     a. 07/2012 Echo: EF 25-30%, diff HK, triv AI, mild MR, mod dil LA, mod reduced RV syst fxn, mildly dil RA.  . Cardiomyopathy, ischemic   . Obesity   . HTN (hypertension), benign   . Hyperlipidemia   . Subdural hematoma     a. 2009.  Marland Kitchen. OSA (obstructive sleep apnea)     a. uses CPAP  . Atrial fibrillation     a. Eliquis started 06/2012.  Marland Kitchen. A-fib     Medications Prior to Admission  Medication Dose Route Frequency Provider Last Rate Last Dose  . cefTRIAXone (ROCEPHIN) injection 500 mg  500 mg Intramuscular Once Deatra CanterWilliam J Oxford, FNP   500 mg at 12/28/13 1515   Medications Prior to Admission  Medication Sig Dispense Refill  . apixaban (ELIQUIS) 2.5 MG TABS tablet Take 2.5 mg by mouth 2 (two) times daily.    Marland Kitchen. atorvastatin (LIPITOR) 20 MG tablet Take 20 mg by mouth every morning.    Marland Kitchen. doxycycline (VIBRA-TABS) 100 MG tablet Take 1 tablet (100 mg total) by mouth 2 (two) times daily. 20 tablet 0  . furosemide (LASIX) 40 MG tablet Take 40-80 mg by mouth 2 (two) times daily.  Two in the morning and one in the evening    . KEPPRA 500 MG tablet TAKE 1 TABLET TWICE A DAY 180 tablet 0  . KLOR-CON M20 20 MEQ tablet TAKE 1 TABLET DAILY 90 tablet 0  . LANTUS 100 UNIT/ML injection INJECT 40 UNITS UNDER THE SKIN AT BEDTIME 4 vial 1  . lisinopril (PRINIVIL,ZESTRIL) 5 MG tablet Take 5 mg by mouth every morning.    . loratadine (CLARITIN) 10 MG tablet Take 10 mg by mouth every morning.    . mirabegron ER (MYRBETRIQ) 25 MG TB24 tablet Take 25 mg by mouth every morning.    Marland Kitchen. oxybutynin (DITROPAN-XL) 5 MG 24 hr tablet TAKE 1 TABLET DAILY 90 tablet 0  . ELIQUIS 2.5 MG TABS tablet TAKE 1 TABLET TWICE A DAY (Patient not taking: Reported on 03/11/2014) 180 tablet 1  . Vitamin D, Ergocalciferol, (DRISDOL) 50000 UNITS CAPS capsule Take 50,000 Units by mouth every Wednesday.       Marland Kitchen. antiseptic oral rinse  7 mL Mouth Rinse BID  . atorvastatin  20 mg Oral q morning - 10a  . Chlorhexidine Gluconate Cloth  6 each Topical Q0600  . Influenza vac split quadrivalent PF  0.5 mL Intramuscular Tomorrow-1000  .  insulin aspart  0-9 Units Subcutaneous 6 times per day  . levETIRAcetam  500 mg Oral BID  . mupirocin ointment  1 application Nasal BID  . oxybutynin  5 mg Oral QHS  . pneumococcal 23 valent vaccine  0.5 mL Intramuscular Tomorrow-1000  . sodium chloride  3 mL Intravenous Q12H    Infusions: . dextrose 5 % and 0.9% NaCl 75 mL/hr at 03/13/14 0742    No Known Allergies  History   Social History  . Marital Status: Married    Spouse Name: N/A    Number of Children: 3  . Years of Education: N/A   Occupational History  . Retired [employer]    Social History Main Topics  . Smoking status: Former Smoker -- 0.04 packs/day for .5 years    Types: Cigarettes  . Smokeless tobacco: Never Used     Comment: Distant past  . Alcohol Use: No  . Drug Use: No  . Sexual Activity: Not on file   Other Topics Concern  . Not on file   Social History Narrative   Lives with  daughter and wife in [place].    Family History  Problem Relation Age of Onset  . Other Mother     died during childbirth.  . Stroke Father     PHYSICAL EXAM: Filed Vitals:   03/14/14 0535  BP: 109/44  Pulse: 56  Temp: 98.3 F (36.8 C)  Resp: 18     Intake/Output Summary (Last 24 hours) at 03/14/14 0549 Last data filed at 03/14/14 0537  Gross per 24 hour  Intake    299 ml  Output   2050 ml  Net  -1751 ml    General:  Chronically ill-appearing elderly man in no respiratory difficulty HEENT: normal Neck: supple. no JVD. Carotids 2+ bilat; no bruits. No lymphadenopathy or thryomegaly appreciated. Cor: PMI nondisplaced.Irregularly irregular.  No rubs, gallops or murmurs. Lungs: clear Abdomen: soft, nontender, nondistended. No hepatosplenomegaly. No bruits or masses. Good bowel sounds. Extremities: no cyanosis, clubbing, rash, edema Neuro:Confused and disoriented.  Oriented to person only.  Able to follow simple commands.  ECG: Atrial fibrillation, rate 66bpm.  LBBB.  Results for orders placed or performed during the hospital encounter of 03/11/14 (from the past 24 hour(s))  Glucose, capillary     Status: Abnormal   Collection Time: 03/13/14  7:43 AM  Result Value Ref Range   Glucose-Capillary 115 (H) 70 - 99 mg/dL   Comment 1 Documented in Chart    Comment 2 Notify RN   Occult blood card to lab, stool     Status: None   Collection Time: 03/13/14  8:34 AM  Result Value Ref Range   Fecal Occult Bld NEGATIVE NEGATIVE  Glucose, capillary     Status: Abnormal   Collection Time: 03/13/14 12:10 PM  Result Value Ref Range   Glucose-Capillary 124 (H) 70 - 99 mg/dL  Glucose, capillary     Status: Abnormal   Collection Time: 03/13/14  4:37 PM  Result Value Ref Range   Glucose-Capillary 135 (H) 70 - 99 mg/dL  Glucose, capillary     Status: Abnormal   Collection Time: 03/13/14  8:17 PM  Result Value Ref Range   Glucose-Capillary 133 (H) 70 - 99 mg/dL  Glucose, capillary      Status: Abnormal   Collection Time: 03/14/14 12:32 AM  Result Value Ref Range   Glucose-Capillary 108 (H) 70 - 99 mg/dL  Glucose, capillary  Status: Abnormal   Collection Time: 03/14/14  4:05 AM  Result Value Ref Range   Glucose-Capillary 106 (H) 70 - 99 mg/dL   No results found.   ASSESSMENT: 58M with chronic systolic heart failure EF 25%, DM, atrial fibrillation/flutter, and CKD (baseline creatinine 2) admitted with confusion and acute on chronic renal failure now with an asymptomatic pause while sleeping.  He likely has sinus node dysfunction.  Given that he is asymptomatic and it only occurred when sleeping, he doesn't meet indications for a pacemaker.  He has heart failure and cannot tolerate a beta blocker due to bradycardia, which is an indication for a pacemaker, but cannot receive it due to bradycardia. However, he is acutely ill, oriented to himself only and has a DNAR on file.  For these reasons I would not pursue a pacemaker at this time.   PLAN/DISCUSSION:  # Sinus node dysfunction - avoid all nodal agents - continue to monitor heart rate during waking hours.  If he is unable to augment his HR with activity or has symptomatic pauses, could consider pacemaker  # Atrial fibrillation: Rates are well-controlled.  Agree with holding Apixaban given his acute renal failure.  CHADS-Vasc is 5. - Restart anticoagulation when stable - Hold nodal agents as above  # Code: DNAR

## 2014-03-14 NOTE — Progress Notes (Signed)
Utilization review completed. Iridessa Harrow, RN, BSN. 

## 2014-03-14 NOTE — Evaluation (Signed)
Physical Therapy Evaluation Patient Details Name: Taylor Jacobson MRN: [id number] DOB: 12-03-1926 Today's Date: 03/14/2014   History of Present Illness  Pt is an 80 y/o male with a PMH of DM, CKD, HTN, CHF, obesity, a-fib, CAD s/p CABG. Pt presented to the ED with generalized fatigue and confusion for 2 weeks PTA. He was seen by his PCP and sent to the ED. He was admitted for AKI on CKD.   Clinical Impression  Pt admitted with the above. Pt currently with functional limitations due to the deficits listed below (see PT Problem List). At the time of PT eval pt was able to perform transfers with min-mod assist and RW for support. Did not persue gait training at this time due to safety and pt fatigue after SPT to chair. Pt will benefit from skilled PT to increase their independence and safety with mobility to allow discharge to the venue listed below. Due to low tolerance for functional activity and need for heavy mod assist to sit EOB, recommending follow-up with STR at the SNF level at d/c.      Follow Up Recommendations SNF;Supervision/Assistance - 24 hour    Equipment Recommendations  Rolling walker with 5" wheels;3in1 (PT) (Needs may change after more info is gained about home equip)    Recommendations for Other Services       Precautions / Restrictions Precautions Precautions: Fall Restrictions Weight Bearing Restrictions: No      Mobility  Bed Mobility Overal bed mobility: Needs Assistance Bed Mobility: Rolling;Sidelying to Sit Rolling: Min assist Sidelying to sit: Mod assist       General bed mobility comments: Pt required step-by-step cueing for transition to EOB. Assist to bring LE's off EOB and for trunk elevation to full sitting position. Pt with difficulty pushing himself up, and therapist assisted with pt's hand on PT's shoulder for anterior/lateral support.   Transfers Overall transfer level: Needs assistance Equipment used: Rolling walker (2 wheeled) Transfers:  Sit to/from [employer]Stand;Stand Pivot Transfers Sit to Stand: Min assist Stand pivot transfers: Min assist       General transfer comment: Pt required specific verbal and tactile cueing for hand placement on seated surface for support prior to initiating transfer. Pt rocked forward a couple of times to gain momentum, and was able to power-up to full standing with minimal assistance. Once standing, pt was cued for improved posture. Pt indicated he was not sure if he could pivot around to the recliner chair, and was cued to begin with side steps at EOB. Once pt perfrmed x2 side steps he automatically began taking pivotal steps around to the recliner. Min assist for walker placement.   Ambulation/Gait             General Gait Details: Deferred at this time due to   Stairs            Wheelchair Mobility    Modified Rankin (Stroke Patients Only)       Balance Overall balance assessment: Needs assistance Sitting-balance support: Feet supported;No upper extremity supported Sitting balance-Leahy Scale: Poor Sitting balance - Comments: Requires UE support to maintain seated balance.    Standing balance support: Bilateral upper extremity supported;During functional activity Standing balance-Leahy Scale: Poor Standing balance comment: Requires UE support and PT assist to maintain standing balance.                              Pertinent Vitals/Pain Pain Assessment: No/denies pain  Home Living Family/patient expects to be discharged to:: Private residence Living Arrangements: Spouse/significant other;Children               Additional Comments: Pt able to state that he lives at home with his wife, however is unable to provide any other history information. Pt states he has been up and walking every day since he has been in the hospital, however this is questionable considering the amount of assist required for SPT to chair.     Prior Function           Comments:  PLOF unclear at this time. Pt unable to provide a reliable history.      Hand Dominance        Extremity/Trunk Assessment   Upper Extremity Assessment: Defer to OT evaluation           Lower Extremity Assessment: Generalized weakness      Cervical / Trunk Assessment: Kyphotic  Communication   Communication: Expressive difficulties (Unintelligible speech for most of session. )  Cognition Arousal/Alertness: Awake/alert Behavior During Therapy: Flat affect Overall Cognitive Status: Impaired/Different from baseline Area of Impairment: Orientation;Memory;Following commands;Safety/judgement;Awareness;Problem solving Orientation Level: Disoriented to;Place;Time;Situation   Memory: Decreased short-term memory Following Commands: Follows one step commands inconsistently;Follows one step commands with increased time Safety/Judgement: Decreased awareness of safety;Decreased awareness of deficits Awareness: Emergent Problem Solving: Slow processing;Decreased initiation;Difficulty sequencing;Requires verbal cues;Requires tactile cues General Comments: Communication was difficult during session due to unintelligible speech, which made it hard to determine cognitive deficits.    General Comments      Exercises        Assessment/Plan    PT Assessment Patient needs continued PT services  PT Diagnosis Difficulty walking;Generalized weakness   PT Problem List Decreased strength;Decreased range of motion;Decreased activity tolerance;Decreased balance;Decreased mobility;Decreased knowledge of use of DME;Decreased safety awareness;Decreased knowledge of precautions;Decreased cognition  PT Treatment Interventions DME instruction;Gait training;Stair training;Functional mobility training;Therapeutic activities;Therapeutic exercise;Neuromuscular re-education;Patient/family education   PT Goals (Current goals can be found in the Care Plan section) Acute Rehab PT Goals Patient Stated Goal: Pt  unable to state goals at this time.  PT Goal Formulation: Patient unable to participate in goal setting Time For Goal Achievement: 03/28/14 Potential to Achieve Goals: Fair    Frequency Min 2X/week   Barriers to discharge        Co-evaluation               End of Session Equipment Utilized During Treatment: Gait belt Activity Tolerance: Patient limited by fatigue Patient left: in chair;with chair alarm set;with call bell/phone within reach Nurse Communication: Mobility status         Time: 0942-1010 PT Time Calculation (min) (ACUTE ONLY): 28 min   Charges:   PT Evaluation $Initial PT Evaluation Tier I: 1 Procedure PT Treatments $Gait Training: 8-22 mins $Therapeutic Activity: 8-22 mins   PT G Codes:          Conni SlipperKirkman, Harlon Kutner 03/14/2014, 10:53 AM  Conni SlipperLaura Delquan Poucher, PT, DPT Acute Rehabilitation Services Pager: [phone]

## 2014-03-14 NOTE — Progress Notes (Signed)
Dr. Leonides Sakeandolph's consult note from 5:49 AM this morning was reviewed.  Agree with assessment and plan.  Cardiology will continue to follow.

## 2014-03-14 NOTE — Clinical Social Work Psychosocial (Addendum)
Clinical Social Work Department BRIEF PSYCHOSOCIAL ASSESSMENT 03/14/2014  Patient:  Taylor Jacobson,Taylor Jacobson     Account Number:  [id number]     Admit date:  03/11/2014  Clinical Social Worker:  Taylor Jacobson,Taylor Donati, LCSW  Date/Time:  03/14/2014 04:17 AM  Referred by:  Care Management  Date Referred:  03/14/2014 Referred for  SNF Placement   Other Referral:   Interview type:  Family Other interview type:    PSYCHOSOCIAL DATA Living Status:  WIFE Admitted from facility:   Level of care:   Primary support name:  Taylor Jacobson Primary support relationship to patient:  CHILD, ADULT Degree of support available:   Daughter Taylor BraunKaren [phone]-mobile) and wife at the bedside and very supportive. Wife unable to care for patient in the home.    CURRENT CONCERNS Current Concerns  Post-Acute Placement   Other Concerns:    SOCIAL WORK ASSESSMENT / PLAN CSW talked with patient's daughter by phone and at the bedside, along with patient's wife regarding discharge plans and PT/OT's recommendation of ST rehab. Daughter is in agreement and reported that patient [facility]. CSW reviewed SNF search process with Ms. Mayford KnifeWilliams and provided her with a list of skilled facilities in [place]. Per wife, [place].    Assessment/plan status:  Psychosocial Support/Ongoing Assessment of Needs Other assessment/ plan:   Information/referral to community resources:   [place]NF [place] [facility].    PATIENT'S/FAMILY'S RESPONSE TO PLAN OF CARE: Daughter pleasant and very receptive to talking with CSW about discharge plans. She is agreeable to ST rehab for patient.

## 2014-03-14 NOTE — Clinical Documentation Improvement (Signed)
  PN states "confusion that is thought to be due to his acute renal failure". In the [place] [place] "confusion" is considered nonspecific and is low weighted. If possible please clarify the term "confusion" to better illustrate severity of illness and risk of mortality.  . Document the etiology of the altered mental status as: --Confusion/delirium (including drug-induced) --Encephalopathy   Anoxic/hypoxic   Drug-induced/toxic (specify drug)   Hypertensive   Hypoglycemic   Metabolic/septic   Wernicke   Other (specify)  Thank Bonita QuinYou,  Clide CliffLaurie Castin Donaghue RN CDI [phone] HIM department

## 2014-03-15 DIAGNOSIS — N179 Acute kidney failure, unspecified: Secondary | ICD-10-CM | POA: Insufficient documentation

## 2014-03-15 LAB — BASIC METABOLIC PANEL
ANION GAP: 12 (ref 5–15)
BUN: 45 mg/dL — ABNORMAL HIGH (ref 6–23)
CO2: 16 meq/L — AB (ref 19–32)
Calcium: 8 mg/dL — ABNORMAL LOW (ref 8.4–10.5)
Chloride: 116 mEq/L — ABNORMAL HIGH (ref 96–112)
Creatinine, Ser: 1.9 mg/dL — ABNORMAL HIGH (ref 0.50–1.35)
GFR calc Af Amer: 35 mL/min — ABNORMAL LOW (ref >90)
GFR, EST NON AFRICAN AMERICAN: 31 mL/min — AB (ref >90)
Glucose, Bld: 112 mg/dL — ABNORMAL HIGH (ref 70–99)
Potassium: 4.2 mEq/L (ref 3.7–5.3)
SODIUM: 144 meq/L (ref 137–147)

## 2014-03-15 LAB — CBC
HCT: 30.8 % — ABNORMAL LOW (ref 39.0–52.0)
Hemoglobin: 10 g/dL — ABNORMAL LOW (ref 13.0–17.0)
MCH: 31.2 pg (ref 26.0–34.0)
MCHC: 32.5 g/dL (ref 30.0–36.0)
MCV: 96 fL (ref 78.0–100.0)
PLATELETS: 96 10*3/uL — AB (ref 150–400)
RBC: 3.21 MIL/uL — AB (ref 4.22–5.81)
RDW: 13.8 % (ref 11.5–15.5)
WBC: 5.1 10*3/uL (ref 4.0–10.5)

## 2014-03-15 LAB — GLUCOSE, CAPILLARY
GLUCOSE-CAPILLARY: 100 mg/dL — AB (ref 70–99)
GLUCOSE-CAPILLARY: 134 mg/dL — AB (ref 70–99)
GLUCOSE-CAPILLARY: 143 mg/dL — AB (ref 70–99)
Glucose-Capillary: 104 mg/dL — ABNORMAL HIGH (ref 70–99)
Glucose-Capillary: 108 mg/dL — ABNORMAL HIGH (ref 70–99)
Glucose-Capillary: 112 mg/dL — ABNORMAL HIGH (ref 70–99)
Glucose-Capillary: 114 mg/dL — ABNORMAL HIGH (ref 70–99)
Glucose-Capillary: 186 mg/dL — ABNORMAL HIGH (ref 70–99)

## 2014-03-15 MED ORDER — APIXABAN 2.5 MG PO TABS
2.5000 mg | ORAL_TABLET | Freq: Two times a day (BID) | ORAL | Status: DC
  Administered 2014-03-15 – 2014-03-17 (×5): 2.5 mg via ORAL
  Filled 2014-03-15 (×6): qty 1

## 2014-03-15 MED ORDER — FUROSEMIDE 20 MG PO TABS
20.0000 mg | ORAL_TABLET | Freq: Two times a day (BID) | ORAL | Status: DC
  Administered 2014-03-15 – 2014-03-17 (×4): 20 mg via ORAL
  Filled 2014-03-15 (×6): qty 1

## 2014-03-15 NOTE — Progress Notes (Signed)
The patient is in chronic atrial fibrillation. He is not on any AV blocking drugs.Telemetry strips were reviewed.  While he is asleep his heart rate drops to the 30s. During waking hours his heart rate is satisfactory. He denies any symptoms related to his heart rate. No indication for a pacemaker.  Will sign off now. Please call for questions.

## 2014-03-15 NOTE — Care Management Note (Signed)
CARE MANAGEMENT NOTE 03/15/2014  Patient:  Clover MealyJONES,Ralphael HUBERT   Account Number:  [id number]  Date Initiated:  03/14/2014  Documentation initiated by:  Johny ShockOYAL,Luticia Tadros  Subjective/Objective Assessment:   CM following for progression and d/c planning.     Action/Plan:   Noted CM consult, will follow for PT/OT eval and recommendations.   Anticipated DC Date:  03/16/2014   Anticipated DC Plan:  SKILLED NURSING FACILITY      DC Planning Services  CM consult      Choice offered to / List presented to:             Status of service:  Completed, signed off Medicare Important Message given?  YES (If response is "NO", the following Medicare IM given date fields will be blank) Date Medicare IM given:  03/14/2014 Medicare IM given by:  [facility] Date Additional Medicare IM given:   Additional Medicare IM given by:    Discharge Disposition:  SKILLED NURSING FACILITY  Per UR Regulation:    If discussed at Long Length of Stay Meetings, dates discussed:    Comments:  03/14/2014 Chart reviewed. Isidoro DonningAlesia Shavis RN CCM Case Mgmt

## 2014-03-15 NOTE — Progress Notes (Signed)
PROGRESS NOTE  Taylor Jacobson WUJ:[id number] DOB: 08/30/1926 DOA: 03/11/2014 PCP: Rudi HeapMOORE, DONALD, MD  HPI/Recap of past 4724 hours: [age] year old white male with past medical history of diabetes, systolic congestive heart failure with EF of 25%, atrial fib/flutter and chronic kidney disease and baseline creatinine of 2 admitted on 11/13 for increasing fatigue and confusion for the past few weeks and found to be in acute renal failure with a creatinine of 4.63 and a BUN of 137.  Patient also noted to have a hemoglobin of 12-baseline around 14  After initial day, patient's blood pressures have stabilized. His creatinine continues to improve and his hemoglobin while decreasing, has been at a very slow rate. He was noted to be mildly heme positive  Patient's eliquis put on hold due to renal dysfunction and with heme positive stool, heparin stopped.. Patient transferred to floor on 11/15.  In early morning of 11/16, patient had 5 second pause. Asymptomatic. Seen by cardiology who recommended monitoring and no pacemaker at this time.    Patient continues to do well. Seen by PT who recommended short-term skilled nursing, which patient is amenable to. Renal functions now back to near baseline so eliquis restarted  Assessment/Plan:    Diabetes mellitus:A1c stable and just checked 3 months prior at 7.1. Watching CBGs closely.   Coronary atherosclerosis: Stable   acute encephalopathy: looks to be resolved. Suspect from dehydration and initial uremia    Acute kidney injury in the setting of CKD (chronic kidney disease) stage 3, GFR 30-59 ml/min: Responding to IV fluids. Given markedly elevated BUN, this is prerenal and possibly from GI bleed. Repeat Hemoccult of stool negative.renal function back to his baseline    Anemia of unknown etiology: I'm strongly concerned about the possibility of GI bleed. Stool noted to be Hemoccult positive. His baseline is around 14 and already low, and given markedly elevated  BUN, I suspect that his hemoglobin will somewhat further.after several days, only down to 10.4. Given positive Hemoccult, have stopped heparin and on SCD. Restarted eliquis today  Chronic systolic heart failure: have been very slowly hydrating and with renal function now back to his baseline, has stopped IV fluids and started some of his Lasix  Atrial fibrillation/flutter: Rate controlled. INR subtherapeutic and currently is on heparin.heparin on hold due to concerns for GI bleed.  Eliquis restarted and watching closely  Urinary retention: Unable to void. Have placed Foley catheteron hospital day 2. This could be another cause of his renal failure. Foley catheter discontinued today  Hypernatremia: Noted on 11/16, IV fluids changed to half-normal saline. We'll stop fluids today  Arrhythmia: As mentioned above. Cardiology has seen, avoiding nodal agents. No aggressive measures such as pacemaker. Continue to monitor.   Code Status: DO NOT RESUSCITATE  Family Communication: spoke with daughter by phone  Disposition Plan: skilled nursing facility, likely in the next few days, once we are sure her hemoglobin is stable and patient is not volume overloaded   Consultants:  none  Procedures:  none  Antibiotics:  none   Objective: BP 118/62 mmHg  Pulse 64  Temp(Src) 97.4 F (36.3 C) (Oral)  Resp 20  Ht 6\' 1"  (1.854 m)  Wt 102.1 kg (225 lb 1.4 oz)  BMI 29.70 kg/m2  SpO2 98%  Intake/Output Summary (Last 24 hours) at 03/15/14 1649 Last data filed at 03/15/14 1328  Gross per 24 hour  Intake      0 ml  Output   1400 ml  Net  -1400 ml  Filed Weights   03/13/14 2013 03/14/14 2018 03/15/14 0500  Weight: 99.8 kg (220 lb 0.3 oz) 100.5 kg (221 lb 9 oz) 102.1 kg (225 lb 1.4 oz)    Exam:   General:  Alert and oriented 2, no acute distress  Cardiovascular: irregular rhythm, rate controlled  Respiratory: clear to auscultation bilaterally, no rales  Abdomen: soft, obese,  nontender, positive bowel sounds  Musculoskeletal: no clubbing or cyanosis, trace edema   Data Reviewed: Basic Metabolic Panel:  Recent Labs Lab 03/11/14 1642 03/12/14 0630 03/13/14 0245 03/14/14 0448 03/15/14 0640  NA 141 143 141 149* 144  K 5.8* 5.5* 4.8 4.8 4.2  CL 106 111 113* 120* 116*  CO2 14* 15* 14* 18* 16*  GLUCOSE 83 71 126* 108* 112*  BUN 137* 123* 98* 65* 45*  CREATININE 4.63* 3.93* 2.93* 2.20* 1.90*  CALCIUM 8.3* 8.0* 8.1* 8.4 8.0*   Liver Function Tests:  Recent Labs Lab 03/12/14 0630  AST 56*  ALT 39  ALKPHOS 142*  BILITOT 0.4  PROT 6.4  ALBUMIN 2.5*   No results for input(s): LIPASE, AMYLASE in the last 168 hours. No results for input(s): AMMONIA in the last 168 hours. CBC:  Recent Labs Lab 03/11/14 1642 03/12/14 0630 03/13/14 0245 03/14/14 0448 03/15/14 0640  WBC 8.1 6.2 6.1 5.4 5.1  HGB 12.1* 11.1* 10.8* 10.4* 10.0*  HCT 36.8* 33.6* 32.9* 32.2* 30.8*  MCV 91.5 93.9 93.2 95.8 96.0  PLT 167 129* 124* 110* 96*   Cardiac Enzymes:    Recent Labs Lab 03/11/14 1642  TROPONINI <0.30   BNP (last 3 results)  Recent Labs  03/11/14 1642  PROBNP 1497.0*   CBG:  Recent Labs Lab 03/14/14 2017 03/15/14 03/15/14 0430 03/15/14 0837 03/15/14 1232  GLUCAP 104* 112* 114* 108* 143*    Recent Results (from the past 240 hour(s))  Urine culture     Status: None   Collection Time: 03/11/14  5:38 PM  Result Value Ref Range Status   Specimen Description URINE, CATHETERIZED  Final   Special Requests ADDED [id number]  Final   Culture  Setup Time   Final    03/12/2014 01:38 Performed at [facility]    Colony Count NO GROWTH Performed at [facility]   Final   Culture NO GROWTH Performed at [facility]   Final   Report Status 03/13/2014 FINAL  Final  MRSA PCR Screening     Status: Abnormal   Collection Time: 03/11/14 11:22 PM  Result Value Ref Range Status   MRSA by PCR POSITIVE (A) NEGATIVE Final     Comment:        The GeneXpert MRSA Assay (FDA approved for NASAL specimens only), is one component of a comprehensive MRSA colonization surveillance program. It is not intended to diagnose MRSA infection nor to guide or monitor treatment for MRSA infections. RESULT CALLED TO, READ BACK BY AND VERIFIED WITH: Ian Bushman [id number] [facility]      Studies: No results found.  Scheduled Meds: . antiseptic oral rinse  7 mL Mouth Rinse BID  . apixaban  2.5 mg Oral BID  . atorvastatin  20 mg Oral q morning - 10a  . Chlorhexidine Gluconate Cloth  6 each Topical Q0600  . feeding supplement (NEPRO CARB STEADY)  237 mL Oral BID BM  . furosemide  20 mg Oral BID  . insulin aspart  0-9 Units Subcutaneous 6 times per day  . levETIRAcetam  500 mg Oral  BID  . mupirocin ointment  1 application Nasal BID  . oxybutynin  5 mg Oral QHS  . sodium chloride  3 mL Intravenous Q12H    Continuous Infusions:     Time spent: 25 minutes  Hollice EspyKRISHNAN,Shallyn Constancio K  [facility] Pager [phone]. If 7PM-7AM, please contact night-coverage at [email or web address], password [facility] 03/15/2014, 4:49 PM  LOS: 4 days

## 2014-03-15 NOTE — Progress Notes (Signed)
ANTICOAGULATION CONSULT NOTE - Follow Up Consult  Pharmacy Consult for Eliquis Indication: atrial fibrillation  No Known Allergies  Patient Measurements: Height: 6\' 1"  (185.4 cm) Weight: 225 lb 1.4 oz (102.1 kg) IBW/kg (Calculated) : 79.9  Vital Signs: Temp: 97.4 F (36.3 C) (11/17 0425) Temp Source: Oral (11/17 0425) BP: 118/62 mmHg (11/17 0425) Pulse Rate: 64 (11/17 0425)  Labs:  Recent Labs  03/12/14 1917  03/13/14 0245 03/14/14 0448 03/15/14 0640  HGB  --   < > 10.8* 10.4* 10.0*  HCT  --   --  32.9* 32.2* 30.8*  PLT  --   --  124* 110* 96*  APTT 63*  --  156*  --   --   HEPARINUNFRC  --   --  1.60*  --   --   CREATININE  --   --  2.93* 2.20* 1.90*  < > = values in this interval not displayed.  Estimated Creatinine Clearance: 35.7 mL/min (by C-G formula based on Cr of 1.9).  Assessment: Pt is a 80 yo male on Eliquis PTA for afib. Heparin was started on 11/13 and held on 11/15 for suspected GI bleed. Hemoccult on 11/15 later came back negative. Hemoglobin is low but remains stable at 10 today. Per MD: ok to restart Eliquis. Home dose of Eliquis is 2.5 mg BID.   Goal of Therapy:  -Monitor platelets by anticoagulation procotol: Yes   Plan:  1. Eliquis 2.5 mg po BID starting 11/17 2. Monitor CBC, s/s of bleeding  Raymondo BandLiu, Haynes Giannotti, PharmD Candidate 03/15/2014,10:11 AM

## 2014-03-16 LAB — CBC
HCT: 31.8 % — ABNORMAL LOW (ref 39.0–52.0)
Hemoglobin: 10.3 g/dL — ABNORMAL LOW (ref 13.0–17.0)
MCH: 30.9 pg (ref 26.0–34.0)
MCHC: 32.4 g/dL (ref 30.0–36.0)
MCV: 95.5 fL (ref 78.0–100.0)
Platelets: 104 10*3/uL — ABNORMAL LOW (ref 150–400)
RBC: 3.33 MIL/uL — ABNORMAL LOW (ref 4.22–5.81)
RDW: 13.6 % (ref 11.5–15.5)
WBC: 6.3 10*3/uL (ref 4.0–10.5)

## 2014-03-16 LAB — BASIC METABOLIC PANEL
Anion gap: 12 (ref 5–15)
BUN: 30 mg/dL — ABNORMAL HIGH (ref 6–23)
CO2: 15 mEq/L — ABNORMAL LOW (ref 19–32)
Calcium: 8.2 mg/dL — ABNORMAL LOW (ref 8.4–10.5)
Chloride: 115 mEq/L — ABNORMAL HIGH (ref 96–112)
Creatinine, Ser: 1.66 mg/dL — ABNORMAL HIGH (ref 0.50–1.35)
GFR calc Af Amer: 42 mL/min — ABNORMAL LOW (ref >90)
GFR calc non Af Amer: 36 mL/min — ABNORMAL LOW (ref >90)
Glucose, Bld: 97 mg/dL (ref 70–99)
Potassium: 4.3 mEq/L (ref 3.7–5.3)
Sodium: 142 mEq/L (ref 137–147)

## 2014-03-16 LAB — GLUCOSE, CAPILLARY
GLUCOSE-CAPILLARY: 88 mg/dL (ref 70–99)
GLUCOSE-CAPILLARY: 164 mg/dL — AB (ref 70–99)
Glucose-Capillary: 148 mg/dL — ABNORMAL HIGH (ref 70–99)
Glucose-Capillary: 143 mg/dL — ABNORMAL HIGH (ref 70–99)

## 2014-03-16 MED ORDER — CAMPHOR-MENTHOL 0.5-0.5 % EX LOTN
TOPICAL_LOTION | CUTANEOUS | Status: DC | PRN
  Filled 2014-03-16: qty 222

## 2014-03-16 NOTE — Clinical Social Work Placement (Addendum)
Clinical Social Work Department CLINICAL SOCIAL WORK PLACEMENT NOTE 03/16/2014  Patient:  Taylor Jacobson,Taylor Jacobson  Account Number:  [id number] Admit date:  03/11/2014  Clinical Social Worker:  Genelle BalVANESSA Danilo Cappiello, LCSW  Date/time:  03/17/2014 04:14 AM  Clinical Social Work is seeking post-discharge placement for this patient at the following level of care:   SKILLED NURSING   (*CSW will update this form in Epic as items are completed)   03/16/2014  Patient/family provided with [facility] of Clinical Social Work's list of facilities offering this level of care within the geographic area requested by the patient (or if unable, by the patient's family).  03/15/2014  Patient/family informed of their freedom to choose among providers that offer the needed level of care, that participate in Medicare, Medicaid or managed care program needed by the patient, have an available bed and are willing to accept the patient.    Patient/family informed of [facility]' ownership interest in [facility], as well as of the fact that they are under no obligation to receive care at this facility.  PASARR submitted to EDS in 2009 PASARR number received in 2009   FL2 transmitted to all facilities in geographic area requested by pt/family on  03/15/2014 FL2 transmitted to all facilities within larger geographic area on   Patient informed that his/her managed care company has contracts with or will negotiate with  certain facilities, including the following:     Patient/family informed of bed offers received:  03/16/2014 [facility] Physician recommends and patient chooses bed at    [facility]  [facility]/17/2014. See comments section below. Patient to be transferred to facility by ambulance Patient and family notified of transfer on 03/16/14 Name of family member notified: Daughter [name] [phone])    The following physician request were entered in Epic:  Additional Comments: 03/16/14: CSW received insurance authorization from [facility] representative Lerry PatersonRebecca Nathan [phone], [phone]. [phone]). Auth [id number] for 3 days. Next review date 11/20 and RVB level. 03/17/14: [facility] and spoke with [place] in admissions. She rec'd insurance authorization Wednesday, 11/18. She will contact CSW when daughter arrives to complete admissions paperwork. Patient can then be transported to facility.

## 2014-03-16 NOTE — Care Management Note (Signed)
CARE MANAGEMENT NOTE 03/16/2014  Patient:  Taylor Jacobson,Taylor Jacobson   Account Number:  [id number]  Date Initiated:  03/14/2014  Documentation initiated by:  Johny ShockOYAL,Cayce Paschal  Subjective/Objective Assessment:   CM following for progression and d/c planning.     Action/Plan:   Noted CM consult, will follow for PT/OT eval and recommendations.   Anticipated DC Date:  03/16/2014   Anticipated DC Plan:  SKILLED NURSING FACILITY      DC Planning Services  CM consult      Choice offered to / List presented to:             Status of service:  Completed, signed off Medicare Important Message given?  YES (If response is "NO", the following Medicare IM given date fields will be blank) Date Medicare IM given:  03/14/2014 Medicare IM given by:  [facility]ALESIA Date Additional Medicare IM given:   Additional Medicare IM given by:    Discharge Disposition:  SKILLED NURSING FACILITY  Per UR Regulation:    If discussed at Long Length of Stay Meetings, dates discussed:    Comments:  03/14/2014 Chart reviewed. Isidoro DonningAlesia Shavis RN CCM Case Mgmt

## 2014-03-16 NOTE — Progress Notes (Signed)
Physical Therapy Treatment Patient Details Name: Taylor Jacobson MRN: [id number] DOB: 04-Dec-1926 Today's Date: 03/16/2014    History of Present Illness Pt is an 80 y/o male with a PMH of DM, CKD, HTN, CHF, obesity, a-fib, CAD s/p CABG. Pt presented to the ED with generalized fatigue and confusion for 2 weeks PTA. He was seen by his PCP and sent to the ED. He was admitted for AKI on CKD.     PT Comments    Pt progressing towards physical therapy goals. Pt was able to perform transfers with +2 assist this session. Per nursing, pt has been NPO ~2 days and is now just beginning to eat again. This may be impacting pt's strength and overall energy level. Recommended use of Stedy for back to bed with nursing staff. Will continue to follow and progress as able per POC.   Follow Up Recommendations  SNF;Supervision/Assistance - 24 hour     Equipment Recommendations  Rolling walker with 5" wheels;3in1 (PT)    Recommendations for Other Services       Precautions / Restrictions Precautions Precautions: Fall Restrictions Weight Bearing Restrictions: No    Mobility  Bed Mobility Overal bed mobility: Needs Assistance Bed Mobility: Rolling;Sidelying to Sit Rolling: Min assist Sidelying to sit: Mod assist;+2 for physical assistance       General bed mobility comments: Pt required +2 assist this session with LE movement and trunk support when transitioning to EOB. Once sitting, pt able to maintain seated balance well.   Transfers Overall transfer level: Needs assistance Equipment used: Rolling walker (2 wheeled) Transfers: Sit to/from Stand Sit to Stand: Mod assist;+2 physical assistance Stand pivot transfers: Mod assist;+2 physical assistance       General transfer comment: Pt was able to power up to full standing position with +2 assist for balance. Knees buckling initially, however was able to maintain standing with assistance. Pt was able to take a few pivotal steps around to the  recliner chair with +2 assist for walker placement and general support. Specific VC's required for sequencing and technique.   Ambulation/Gait             General Gait Details: Deferred due to pt fatigue.    Stairs            Wheelchair Mobility    Modified Rankin (Stroke Patients Only)       Balance Overall balance assessment: Needs assistance Sitting-balance support: Feet supported;No upper extremity supported Sitting balance-Leahy Scale: Poor Sitting balance - Comments: Requires UE support to maintain foreward seated balance in recliner   Standing balance support: Bilateral upper extremity supported;During functional activity Standing balance-Leahy Scale: Poor Standing balance comment: Requires assist to maintain standing balance.                     Cognition Arousal/Alertness: Awake/alert Behavior During Therapy: Flat affect Overall Cognitive Status: Impaired/Different from baseline Area of Impairment: Orientation;Memory;Following commands;Safety/judgement;Awareness;Problem solving Orientation Level: Disoriented to;Place;Time;Situation   Memory: Decreased short-term memory Following Commands: Follows one step commands inconsistently;Follows one step commands with increased time Safety/Judgement: Decreased awareness of safety;Decreased awareness of deficits Awareness: Emergent Problem Solving: Slow processing;Decreased initiation;Difficulty sequencing;Requires verbal cues;Requires tactile cues      Exercises      General Comments        Pertinent Vitals/Pain Pain Assessment: No/denies pain    Home Living Family/patient expects to be discharged to:: Skilled nursing facility  Prior Function            PT Goals (current goals can now be found in the care plan section) Acute Rehab PT Goals Patient Stated Goal: Pt unable to state due to cognitive issues PT Goal Formulation: Patient unable to participate in goal  setting Time For Goal Achievement: 03/28/14 Potential to Achieve Goals: Fair Progress towards PT goals: Progressing toward goals    Frequency  Min 2X/week    PT Plan Current plan remains appropriate    Co-evaluation             End of Session Equipment Utilized During Treatment: Gait belt Activity Tolerance: Patient limited by fatigue Patient left: in chair;with chair alarm set;with call bell/phone within reach     Time: 1006-1030 PT Time Calculation (min) (ACUTE ONLY): 24 min  Charges:  $Gait Training: 8-22 mins $Therapeutic Activity: 8-22 mins                    G Codes:      Conni SlipperKirkman, Joniqua Sidle 03/16/2014, 11:24 AM   Conni SlipperLaura Rondalyn Belford, PT, DPT Acute Rehabilitation Services Pager: [phone]

## 2014-03-16 NOTE — Progress Notes (Signed)
PROGRESS NOTE  Clover Mealyarl Hubert Terrill WUJ:[id number] DOB: 1926-10-25 DOA: 03/11/2014 PCP: Rudi HeapMOORE, DONALD, MD  HPI/Recap of past 7924 hours: 80 year old white male with past medical history of diabetes, systolic congestive heart failure with EF of 25%, atrial fib/flutter and chronic kidney disease and baseline creatinine of 2 admitted on 11/13 for increasing fatigue and confusion for the past few weeks and found to be in acute renal failure with a creatinine of 4.63 and a BUN of 137.  Patient also noted to have a hemoglobin of 12-baseline around 14  After initial day, patient's blood pressures have stabilized. His creatinine continues to improve and his hemoglobin while decreasing, has been at a very slow rate. He was noted to be mildly heme positive  Patient's eliquis put on hold due to renal dysfunction and with heme positive stool, heparin stopped.. Patient transferred to floor on 11/15.  In early morning of 11/16, patient had 5 second pause. Asymptomatic. Seen by cardiology who recommended monitoring and no pacemaker at this time.    Patient continues to do well. Seen by PT who recommended short-term skilled nursing, which patient is amenable to. Renal functions now back to near baseline so eliquis restarted  Assessment/Plan:    Acute encephalopathy:  -improving but confused -likely due to dehydration/uremia -may have some cognitive dysfunction at baseline    Acute kidney injury in the setting of CKD (chronic kidney disease) stage 3, GFR 30-59 ml/min:   -improved with IVF  -creatinine back to baseline -Po lasix resumed, bmet in am    Diabetes mellitus:A1c stable and just checked 3 months prior at 7.1.  -stable, SSI    Anemia-multifactorial -no overt bleeding, initially hemoccult trace positive, repeat negative -drop in hb from 12 to 10 likely due to hemodilution, stable at 10 now -restarted eliquis 11/17  Chronic systolic heart failure: have been very slowly hydrating and with renal  function now back to his baseline,  -hence IVF stopped and lasix resumed  Atrial fibrillation/flutter: Rate controlled.  - Eliquis restarted and watching closely  Urinary retention:  -Required  Foley catheter on hospital day 2 - Foley catheter discontinued 11/17 per Dr.Krishnan  Hypernatremia: Noted on 11/16 -resolved  Arrhythmia/bradycardia with pause: - noted early this admission - Cardiology has seen, avoiding nodal agents. No aggressive measures such as pacemaker. Continue to monitor.  Code Status: DO NOT RESUSCITATE Family Communication: none at bedside Disposition Plan: skilled nursing facility, likely in the next few days, once we are sure her hemoglobin is stable and acidosis, mentation stable  Consultants:  Cards  Procedures:  none  Antibiotics:  none   Objective: BP 112/87 mmHg  Pulse 76  Temp(Src) 98 F (36.7 C) (Oral)  Resp 18  Ht 6\' 1"  (1.854 m)  Wt 102 kg (224 lb 13.9 oz)  BMI 29.67 kg/m2  SpO2 97%  Intake/Output Summary (Last 24 hours) at 03/16/14 1603 Last data filed at 03/15/14 1856  Gross per 24 hour  Intake     60 ml  Output      0 ml  Net     60 ml   Filed Weights   03/14/14 2018 03/15/14 0500 03/15/14 1953  Weight: 100.5 kg (221 lb 9 oz) 102.1 kg (225 lb 1.4 oz) 102 kg (224 lb 13.9 oz)    Exam:   General:  Alert and oriented  to self only, some what confused, no acute distress  Cardiovascular: irregular rhythm, rate controlled  Respiratory: clear to auscultation bilaterally, no rales  Abdomen: soft,  obese, nontender, positive bowel sounds  Musculoskeletal: no clubbing or cyanosis, trace edema   Neuro: moves all extremities, no localizing signs  Data Reviewed: Basic Metabolic Panel:  Recent Labs Lab 03/12/14 0630 03/13/14 0245 03/14/14 0448 03/15/14 0640 03/16/14 0630  NA 143 141 149* 144 142  K 5.5* 4.8 4.8 4.2 4.3  CL 111 113* 120* 116* 115*  CO2 15* 14* 18* 16* 15*  GLUCOSE 71 126* 108* 112* 97  BUN 123*  98* 65* 45* 30*  CREATININE 3.93* 2.93* 2.20* 1.90* 1.66*  CALCIUM 8.0* 8.1* 8.4 8.0* 8.2*   Liver Function Tests:  Recent Labs Lab 03/12/14 0630  AST 56*  ALT 39  ALKPHOS 142*  BILITOT 0.4  PROT 6.4  ALBUMIN 2.5*   No results for input(s): LIPASE, AMYLASE in the last 168 hours. No results for input(s): AMMONIA in the last 168 hours. CBC:  Recent Labs Lab 03/12/14 0630 03/13/14 0245 03/14/14 0448 03/15/14 0640 03/16/14 0630  WBC 6.2 6.1 5.4 5.1 6.3  HGB 11.1* 10.8* 10.4* 10.0* 10.3*  HCT 33.6* 32.9* 32.2* 30.8* 31.8*  MCV 93.9 93.2 95.8 96.0 95.5  PLT 129* 124* 110* 96* 104*   Cardiac Enzymes:    Recent Labs Lab 03/11/14 1642  TROPONINI <0.30   BNP (last 3 results)  Recent Labs  03/11/14 1642  PROBNP 1497.0*   CBG:  Recent Labs Lab 03/15/14 1635 03/15/14 1955 03/15/14 2355 03/16/14 0358 03/16/14 1154  GLUCAP 100* 186* 134* 88 148*    Recent Results (from the past 240 hour(s))  Urine culture     Status: None   Collection Time: 03/11/14  5:38 PM  Result Value Ref Range Status   Specimen Description URINE, CATHETERIZED  Final   Special Requests ADDED [id number]  Final   Culture  Setup Time   Final    03/12/2014 01:38 Performed at [facility]    Colony Count NO GROWTH Performed at [facility]   Final   Culture NO GROWTH Performed at [facility]   Final   Report Status 03/13/2014 FINAL  Final  MRSA PCR Screening     Status: Abnormal   Collection Time: 03/11/14 11:22 PM  Result Value Ref Range Status   MRSA by PCR POSITIVE (A) NEGATIVE Final    Comment:        The GeneXpert MRSA Assay (FDA approved for NASAL specimens only), is one component of a comprehensive MRSA colonization surveillance program. It is not intended to diagnose MRSA infection nor to guide or monitor treatment for MRSA infections. RESULT CALLED TO, READ BACK BY AND VERIFIED WITH: Ian Bushman. DILLON,RN [id number] [facility]      Studies: No  results found.  Scheduled Meds: . antiseptic oral rinse  7 mL Mouth Rinse BID  . apixaban  2.5 mg Oral BID  . atorvastatin  20 mg Oral q morning - 10a  . Chlorhexidine Gluconate Cloth  6 each Topical Q0600  . feeding supplement (NEPRO CARB STEADY)  237 mL Oral BID BM  . furosemide  20 mg Oral BID  . insulin aspart  0-9 Units Subcutaneous 6 times per day  . levETIRAcetam  500 mg Oral BID  . mupirocin ointment  1 application Nasal BID  . sodium chloride  3 mL Intravenous Q12H    Continuous Infusions:     Time spent: 25 minutes  [facility] Medical CenterJOSEPH,Eljay Lave  [facility] Pager [phone]. If 7PM-7AM, please contact night-coverage at [email or web address], password [email or web address] 03/16/2014, 4:03 PM  LOS:  5 days

## 2014-03-17 LAB — BASIC METABOLIC PANEL
ANION GAP: 15 (ref 5–15)
BUN: 30 mg/dL — ABNORMAL HIGH (ref 6–23)
CALCIUM: 8.1 mg/dL — AB (ref 8.4–10.5)
CHLORIDE: 110 meq/L (ref 96–112)
CO2: 16 mEq/L — ABNORMAL LOW (ref 19–32)
CREATININE: 1.74 mg/dL — AB (ref 0.50–1.35)
GFR calc non Af Amer: 34 mL/min — ABNORMAL LOW (ref >90)
GFR, EST AFRICAN AMERICAN: 39 mL/min — AB (ref >90)
Glucose, Bld: 94 mg/dL (ref 70–99)
Potassium: 3.9 mEq/L (ref 3.7–5.3)
SODIUM: 141 meq/L (ref 137–147)

## 2014-03-17 LAB — GLUCOSE, CAPILLARY
GLUCOSE-CAPILLARY: 99 mg/dL (ref 70–99)
Glucose-Capillary: 110 mg/dL — ABNORMAL HIGH (ref 70–99)
Glucose-Capillary: 187 mg/dL — ABNORMAL HIGH (ref 70–99)
Glucose-Capillary: 92 mg/dL (ref 70–99)

## 2014-03-17 LAB — CBC
HCT: 29.6 % — ABNORMAL LOW (ref 39.0–52.0)
Hemoglobin: 9.5 g/dL — ABNORMAL LOW (ref 13.0–17.0)
MCH: 29.8 pg (ref 26.0–34.0)
MCHC: 32.1 g/dL (ref 30.0–36.0)
MCV: 92.8 fL (ref 78.0–100.0)
PLATELETS: 104 10*3/uL — AB (ref 150–400)
RBC: 3.19 MIL/uL — ABNORMAL LOW (ref 4.22–5.81)
RDW: 13.6 % (ref 11.5–15.5)
WBC: 5.7 10*3/uL (ref 4.0–10.5)

## 2014-03-17 MED ORDER — FUROSEMIDE 40 MG PO TABS: 20.0000 mg | ORAL_TABLET | Freq: Two times a day (BID) | ORAL | Status: DC

## 2014-03-17 MED ORDER — SENNOSIDES-DOCUSATE SODIUM 8.6-50 MG PO TABS
1.0000 | ORAL_TABLET | Freq: Once | ORAL | Status: AC
  Administered 2014-03-17: 1 via ORAL
  Filled 2014-03-17: qty 1

## 2014-03-17 NOTE — Plan of Care (Signed)
Problem: Consults Goal: Nutrition Consult-if indicated Outcome: Completed/Met Date Met:  03/17/14

## 2014-03-17 NOTE — Plan of Care (Signed)
Problem: Phase I Progression Outcomes Goal: OOB as tolerated unless otherwise ordered Outcome: Completed/Met Date Met:  03/17/14  Problem: Phase II Progression Outcomes Goal: Progress activity as tolerated unless otherwise ordered Outcome: Completed/Met Date Met:  03/17/14 Goal: Discharge plan established Outcome: Completed/Met Date Met:  03/17/14 Goal: Vital signs remain stable Outcome: Completed/Met Date Met:  03/17/14 Goal: IV changed to normal saline lock Outcome: Completed/Met Date Met:  03/17/14 Goal: Obtain order to discontinue catheter if appropriate Outcome: Completed/Met Date Met:  03/17/14 Goal: Other Phase II Outcomes/Goals Outcome: Not Applicable Date Met:  44/58/48

## 2014-03-17 NOTE — Discharge Summary (Addendum)
Physician Discharge Summary  Daxen Lanum ZOX:[id number] DOB: 19-Oct-1926 DOA: 03/11/2014  PCP: Rudi Heap, MD  Admit date: 03/11/2014 Discharge date: 03/17/2014  Time spent: 45 minutes  Recommendations for Outpatient Follow-up:  1. PCP in 1 week, could consider evaluation for colonoscopy  2. Please adjust dose lasix depending on volume status/weights etc, dose was decreased this admission 3. For DM: His Lantus was stopped, CBgs have been controlled on sliding scale alone   Discharge Diagnoses:  Principal Problem:   Acute renal failure   Anemia   Bradycardia   Sinus pause   DM type 2 causing renal disease   Coronary atherosclerosis   Altered mental status   CKD (chronic kidney disease) stage 3, GFR 30-59 ml/min   AKI (acute kidney injury)   Anemia of unknown etiology   Overweight (BMI 25.0-29.9)   Chronic systolic CHF (congestive heart failure)   A-fib   Acute renal failure syndrome   Discharge Condition: stable  Diet recommendation: diabetic low sodium  Filed Weights   03/15/14 0500 03/15/14 1953 03/16/14 2029  Weight: 102.1 kg (225 lb 1.4 oz) 102 kg (224 lb 13.9 oz) 104.5 kg (230 lb 6.1 oz)    History of present illness:  Taylor Jacobson is an 80 y.o. male wih hx of DM, CKD, HTN, ischemic CMP and CHF, Obesity, afib on Eliquis, CAD s/p CABG, presented to the ER with generalized fatigue and confusion for the past couple of week. He was seen by his PCP and was sent here. Evalaution included an EKG showing afib/flutter with controlled rate. His Cr was found to be 4.6, with baseline around 2, and BUN of 137. His CXR showed cardiomegaly but no CHF, and his BNP was 1400s, with troponin of 0.3. He has been on Lasix and Lisinopril. An I/O showed no urinary retention, and UA showed no cell casts nor WBCs. Hospitalist was asked to admit him for AKI on CKD  Hospital Course:  Acute encephalopathy:  -improved -likely due to dehydration/uremia -may have some  cognitive dysfunction at baseline   Acute kidney injury in the setting of CKD (chronic kidney disease) stage 3, GFR 30-59 ml/min:  -improved with IVF and holding lasix and ACE -creatinine back to baseline -Po lasix resumed, creatinine stable   Diabetes mellitus:A1c stable and just checked 3 months prior at 7.1.  -stable, SSI   Anemia-multifactorial -no overt bleeding, initially hemoccult trace positive while in Step down unit, repeat negative -drop in hb from 12 to 10 likely due to hemodilution, stable at 10 now -restarted eliquis 11/17 -discussed colonoscopy with patient and family, due to ongoing cardiac issues plan to defer this for now and re-consider this when stable on an outpatient basis  Chronic systolic heart failure: have been very slowly hydrating and with renal function now back to his baseline,  -hence IVF stopped and lasix resumed  Atrial fibrillation/flutter: Rate controlled.  - Eliquis restarted and stable  Urinary retention:  -resolved -Required Foley catheter on hospital day 2 - Foley catheter discontinued 11/17, able to void   Hypernatremia: Noted on 11/16 -resolved  Arrhythmia/bradycardia with pause: - noted early this admission - Cardiology has seen him and recommended avoiding nodal agents. No aggressive measures such as pacemaker.  -remained stable since  Code Status: DO NOT RESUSCITATE  Consultations:  Cardiology  Discharge Exam: Filed Vitals:   03/17/14 0814  BP: 116/54  Pulse: 58  Temp: 99.2 F (37.3 C)  Resp: 23    General: AAo to self, place Cardiovascular:  S1S2/RRR Respiratory: CTAB  Discharge Instructions You were cared for by a hospitalist during your hospital stay. If you have any questions about your discharge medications or the care you received while you were in the hospital after you are discharged, you can call the unit and asked to speak with the hospitalist on call if the hospitalist that took care of you is not  available. Once you are discharged, your primary care physician will handle any further medical issues. Please note that NO REFILLS for any discharge medications will be authorized once you are discharged, as it is imperative that you return to your primary care physician (or establish a relationship with a primary care physician if you do not have one) for your aftercare needs so that they can reassess your need for medications and monitor your lab values.  Discharge Instructions    Diet - low sodium heart healthy    Complete by:  As directed      Diet Carb Modified    Complete by:  As directed      Increase activity slowly    Complete by:  As directed           Current Discharge Medication List    CONTINUE these medications which have CHANGED   Details  furosemide (LASIX) 40 MG tablet Take 0.5 tablets (20 mg total) by mouth 2 (two) times daily. Qty: 30 tablet      CONTINUE these medications which have NOT CHANGED   Details  apixaban (ELIQUIS) 2.5 MG TABS tablet Take 2.5 mg by mouth 2 (two) times daily.    atorvastatin (LIPITOR) 20 MG tablet Take 20 mg by mouth every morning.    KEPPRA 500 MG tablet TAKE 1 TABLET TWICE A DAY Qty: 180 tablet, Refills: 0    KLOR-CON M20 20 MEQ tablet TAKE 1 TABLET DAILY Qty: 90 tablet, Refills: 0    oxybutynin (DITROPAN-XL) 5 MG 24 hr tablet TAKE 1 TABLET DAILY Qty: 90 tablet, Refills: 0    Vitamin D, Ergocalciferol, (DRISDOL) 50000 UNITS CAPS capsule Take 50,000 Units by mouth every Wednesday.      STOP taking these medications     doxycycline (VIBRA-TABS) 100 MG tablet      LANTUS 100 UNIT/ML injection      lisinopril (PRINIVIL,ZESTRIL) 5 MG tablet      loratadine (CLARITIN) 10 MG tablet      mirabegron ER (MYRBETRIQ) 25 MG TB24 tablet        No Known Allergies Follow-up Information    Follow up with Rudi HeapMOORE, DONALD, MD. Schedule an appointment as soon as possible for a visit in 1 week.   Specialty:  Family Medicine   Contact  information:   [address] [place] [id number] [phone]        The results of significant diagnostics from this hospitalization (including imaging, microbiology, ancillary and laboratory) are listed below for reference.    Significant Diagnostic Studies: Dg Chest 1 View  03/11/2014   CLINICAL DATA:  Cough  EXAM: CHEST - 1 VIEW  COMPARISON:  November 03, 2012  FINDINGS: There is atelectatic change in the left mid lung. There is stable interstitial prominence in the right mid lung, likely representing localized fibrosis. No frank edema or consolidation. Heart is enlarged with pulmonary vascularity within normal limits. No adenopathy. Patient is status post coronary artery bypass grafting.  IMPRESSION: Probable interstitial fibrosis right mid lung, stable. Mild atelectasis left mid lung. No edema or consolidation. Stable cardiomegaly.  Electronically Signed   By: Bretta BangWilliam  Woodruff M.D.   On: 03/11/2014 15:13   [place] Renal  03/11/2014   CLINICAL DATA:  AKI (acute kidney injury) N17.9 (ICD-10-CM).  EXAM: RENAL/URINARY TRACT ULTRASOUND COMPLETE  COMPARISON:  CT 09/03/2012  FINDINGS: Right Kidney: 12.5 cm. Interpolar 2.1 cm cyst. Mildly increased echogenicity and decreased cortical thickness. No hydronephrosis.  Left Kidney: 10.9 cm. No hydronephrosis. Mild renal cortical thinning and increased echogenicity.  Bladder:  Within normal limits.  IMPRESSION: 1.  No hydronephrosis. 2. Mildly increased renal echogenicity and decreased cortical thickness, suggesting medical renal disease.   Electronically Signed   By: Jeronimo GreavesKyle  Talbot M.D.   On: 03/11/2014 21:31   Dg Chest Portable 1 View  03/11/2014   CLINICAL DATA:  Shortness of breath, altered mental status, diaphoresis  EXAM: PORTABLE CHEST - 1 VIEW  COMPARISON:  03/11/2014  FINDINGS: Evidence of CABG reidentified. Moderate enlargement of the cardiac silhouette is reidentified. Diffusely prominent interstitial reticular opacities are noted. No new focal  opacity. Lungs are hypoaerated with crowding of the bronchovascular markings. Cardiac leads are present. Trace pleural fluid.  IMPRESSION: Chronically prominent interstitial markings with low lung volumes in a pattern that may suggest usual interstitial pneumonitis but is nonspecific and would be better evaluated at dedicated high-resolution chest CT performed on a nonemergent basis.   Electronically Signed   By: Christiana PellantGretchen  Green M.D.   On: 03/11/2014 17:06    Microbiology: Recent Results (from the past 240 hour(s))  Urine culture     Status: None   Collection Time: 03/11/14  5:38 PM  Result Value Ref Range Status   Specimen Description URINE, CATHETERIZED  Final   Special Requests ADDED [id number]  Final   Culture  Setup Time   Final    03/12/2014 01:38 Performed at [facility]    Colony Count NO GROWTH Performed at [facility]   Final   Culture NO GROWTH Performed at [facility]   Final   Report Status 03/13/2014 FINAL  Final  MRSA PCR Screening     Status: Abnormal   Collection Time: 03/11/14 11:22 PM  Result Value Ref Range Status   MRSA by PCR POSITIVE (A) NEGATIVE Final    Comment:        The GeneXpert MRSA Assay (FDA approved for NASAL specimens only), is one component of a comprehensive MRSA colonization surveillance program. It is not intended to diagnose MRSA infection nor to guide or monitor treatment for MRSA infections. RESULT CALLED TO, READ BACK BY AND VERIFIED WITH: Glory Rosebush. DILLON,RN [id number] [facility]      Labs: Basic Metabolic Panel:  Recent Labs Lab 03/13/14 0245 03/14/14 0448 03/15/14 0640 03/16/14 0630 03/17/14 0440  NA 141 149* 144 142 141  K 4.8 4.8 4.2 4.3 3.9  CL 113* 120* 116* 115* 110  CO2 14* 18* 16* 15* 16*  GLUCOSE 126* 108* 112* 97 94  BUN 98* 65* 45* 30* 30*  CREATININE 2.93* 2.20* 1.90* 1.66* 1.74*  CALCIUM 8.1* 8.4 8.0* 8.2* 8.1*   Liver Function Tests:  Recent Labs Lab 03/12/14 0630  AST 56*   ALT 39  ALKPHOS 142*  BILITOT 0.4  PROT 6.4  ALBUMIN 2.5*   No results for input(s): LIPASE, AMYLASE in the last 168 hours. No results for input(s): AMMONIA in the last 168 hours. CBC:  Recent Labs Lab 03/13/14 0245 03/14/14 0448 03/15/14 0640 03/16/14 0630 03/17/14 0440  WBC 6.1 5.4 5.1 6.3 5.7  HGB  10.8* 10.4* 10.0* 10.3* 9.5*  HCT 32.9* 32.2* 30.8* 31.8* 29.6*  MCV 93.2 95.8 96.0 95.5 92.8  PLT 124* 110* 96* 104* 104*   Cardiac Enzymes:  Recent Labs Lab 03/11/14 1642  TROPONINI <0.30   BNP: BNP (last 3 results)  Recent Labs  03/11/14 1642  PROBNP 1497.0*   CBG:  Recent Labs Lab 03/16/14 2020 03/17/14 03/17/14 0457 03/17/14 0811 03/17/14 1129  GLUCAP 164* 110* 99 92 187*       Signed:  Dutch Ing  [facility] 03/17/2014, 4:01 PM

## 2014-03-17 NOTE — Plan of Care (Signed)
Problem: Phase I Progression Outcomes Goal: Voiding-avoid urinary catheter unless indicated Outcome: Completed/Met Date Met:  03/17/14 Goal: Hemodynamically stable Outcome: Not Applicable Date Met:  16/10/96 Goal: Other Phase I Outcomes/Goals Outcome: Not Applicable Date Met:  04/54/09

## 2014-03-17 NOTE — Progress Notes (Signed)
Patient Discharge: Disposition: [facility], hemodynamically stable condition. Education: Patient little confused, called and given [facility]. IV: Discontinued before discharge. Telemetry: Discontinued before discharge. Follow-up appointments:  Transferred to SNF. Transportation: Transferred via EMS. Belongings: Patient took all his belongings with him.

## 2014-03-17 NOTE — Plan of Care (Signed)
Problem: Consults Goal: General Medical Patient Education See Patient Education Module for specific education. Outcome: Completed/Met Date Met:  03/17/14     

## 2014-03-17 NOTE — Plan of Care (Signed)
Problem: Discharge Progression Outcomes Goal: Discharge plan in place and appropriate Outcome: Completed/Met Date Met:  03/17/14 Goal: Pain controlled with appropriate interventions Outcome: Completed/Met Date Met:  03/17/14 Goal: Hemodynamically stable Outcome: Completed/Met Date Met:  97/74/14 Goal: Complications resolved/controlled Outcome: Completed/Met Date Met:  03/17/14 Goal: Tolerating diet Outcome: Completed/Met Date Met:  03/17/14 Goal: Activity appropriate for discharge plan Outcome: Completed/Met Date Met:  03/17/14 Goal: Other Discharge Outcomes/Goals Outcome: Not Applicable Date Met:  23/95/32

## 2014-03-17 NOTE — Plan of Care (Signed)
Problem: Phase III Progression Outcomes Goal: Pain controlled on oral analgesia Outcome: Completed/Met Date Met:  03/17/14 Goal: Activity at appropriate level-compared to baseline (UP IN CHAIR FOR HEMODIALYSIS)  Outcome: Not Applicable Date Met:  61/44/31 Goal: Voiding independently Outcome: Completed/Met Date Met:  03/17/14 Goal: IV/normal saline lock discontinued Outcome: Completed/Met Date Met:  03/17/14 Goal: Foley discontinued Outcome: Completed/Met Date Met:  03/17/14 Goal: Discharge plan remains appropriate-arrangements made Outcome: Completed/Met Date Met:  03/17/14 Goal: Other Phase III Outcomes/Goals Outcome: Not Applicable Date Met:  54/00/86

## 2014-04-06 ENCOUNTER — Other Ambulatory Visit: Payer: Self-pay | Admitting: Nurse Practitioner

## 2014-04-13 ENCOUNTER — Other Ambulatory Visit: Payer: Self-pay | Admitting: Cardiology

## 2014-05-21 ENCOUNTER — Other Ambulatory Visit: Payer: Self-pay | Admitting: Nurse Practitioner

## 2014-06-17 ENCOUNTER — Other Ambulatory Visit: Payer: Self-pay | Admitting: Nurse Practitioner

## 2014-06-27 ENCOUNTER — Other Ambulatory Visit: Payer: Self-pay | Admitting: Nurse Practitioner

## 2014-07-01 ENCOUNTER — Emergency Department (HOSPITAL_COMMUNITY)
Admission: EM | Admit: 2014-07-01 | Discharge: 2014-07-02 | Disposition: A | Discharge status: Home / Self Care | Payer: Medicare Other | Attending: Emergency Medicine | Admitting: Emergency Medicine

## 2014-07-01 ENCOUNTER — Encounter (HOSPITAL_COMMUNITY): Payer: Self-pay | Admitting: *Deleted

## 2014-07-01 DIAGNOSIS — Z8669 Personal history of other diseases of the nervous system and sense organs: Secondary | ICD-10-CM | POA: Insufficient documentation

## 2014-07-01 DIAGNOSIS — I5022 Chronic systolic (congestive) heart failure: Secondary | ICD-10-CM | POA: Insufficient documentation

## 2014-07-01 DIAGNOSIS — Z87891 Personal history of nicotine dependence: Secondary | ICD-10-CM | POA: Diagnosis not present

## 2014-07-01 DIAGNOSIS — Z7901 Long term (current) use of anticoagulants: Secondary | ICD-10-CM | POA: Diagnosis not present

## 2014-07-01 DIAGNOSIS — Z79899 Other long term (current) drug therapy: Secondary | ICD-10-CM | POA: Diagnosis not present

## 2014-07-01 DIAGNOSIS — I2510 Atherosclerotic heart disease of native coronary artery without angina pectoris: Secondary | ICD-10-CM | POA: Insufficient documentation

## 2014-07-01 DIAGNOSIS — E785 Hyperlipidemia, unspecified: Secondary | ICD-10-CM | POA: Insufficient documentation

## 2014-07-01 DIAGNOSIS — I10 Essential (primary) hypertension: Secondary | ICD-10-CM | POA: Diagnosis not present

## 2014-07-01 DIAGNOSIS — E669 Obesity, unspecified: Secondary | ICD-10-CM | POA: Diagnosis not present

## 2014-07-01 DIAGNOSIS — E119 Type 2 diabetes mellitus without complications: Secondary | ICD-10-CM | POA: Diagnosis not present

## 2014-07-01 DIAGNOSIS — R4182 Altered mental status, unspecified: Secondary | ICD-10-CM | POA: Insufficient documentation

## 2014-07-01 DIAGNOSIS — R404 Transient alteration of awareness: Secondary | ICD-10-CM

## 2014-07-01 NOTE — ED Notes (Signed)
Pt arrived from home by EMS. Reports family called out for AMS after he woke up from a nap about 4 hours ago. Per EMS pt denies any pain & able to answer questions.
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
## 2014-07-02 ENCOUNTER — Emergency Department (HOSPITAL_COMMUNITY): Payer: Medicare Other

## 2014-07-02 DIAGNOSIS — R4182 Altered mental status, unspecified: Secondary | ICD-10-CM | POA: Diagnosis not present

## 2014-07-02 LAB — COMPREHENSIVE METABOLIC PANEL
ALT: 10 U/L (ref 0–53)
AST: 17 U/L (ref 0–37)
Albumin: 3.1 g/dL — ABNORMAL LOW (ref 3.5–5.2)
Alkaline Phosphatase: 67 U/L (ref 39–117)
Anion gap: 8 (ref 5–15)
BILIRUBIN TOTAL: 0.3 mg/dL (ref 0.3–1.2)
BUN: 24 mg/dL — ABNORMAL HIGH (ref 6–23)
CALCIUM: 8.5 mg/dL (ref 8.4–10.5)
CO2: 29 mmol/L (ref 19–32)
Chloride: 100 mmol/L (ref 96–112)
Creatinine, Ser: 1.83 mg/dL — ABNORMAL HIGH (ref 0.50–1.35)
GFR calc Af Amer: 37 mL/min — ABNORMAL LOW (ref >90)
GFR, EST NON AFRICAN AMERICAN: 32 mL/min — AB (ref >90)
Glucose, Bld: 116 mg/dL — ABNORMAL HIGH (ref 70–99)
Potassium: 4.3 mmol/L (ref 3.5–5.1)
Sodium: 137 mmol/L (ref 135–145)
Total Protein: 7.5 g/dL (ref 6.0–8.3)

## 2014-07-02 LAB — CBC WITH DIFFERENTIAL/PLATELET
BASOS ABS: 0.1 10*3/uL (ref 0.0–0.1)
BASOS PCT: 1 % (ref 0–1)
EOS ABS: 0.3 10*3/uL (ref 0.0–0.7)
Eosinophils Relative: 4 % (ref 0–5)
HCT: 34.9 % — ABNORMAL LOW (ref 39.0–52.0)
Hemoglobin: 11.2 g/dL — ABNORMAL LOW (ref 13.0–17.0)
LYMPHS ABS: 2 10*3/uL (ref 0.7–4.0)
Lymphocytes Relative: 35 % (ref 12–46)
MCH: 29.1 pg (ref 26.0–34.0)
MCHC: 32.1 g/dL (ref 30.0–36.0)
MCV: 90.6 fL (ref 78.0–100.0)
MONO ABS: 0.4 10*3/uL (ref 0.1–1.0)
Monocytes Relative: 8 % (ref 3–12)
Neutro Abs: 3 10*3/uL (ref 1.7–7.7)
Neutrophils Relative %: 52 % (ref 43–77)
Platelets: 224 10*3/uL (ref 150–400)
RBC: 3.85 MIL/uL — ABNORMAL LOW (ref 4.22–5.81)
RDW: 15.5 % (ref 11.5–15.5)
WBC: 5.8 10*3/uL (ref 4.0–10.5)

## 2014-07-02 LAB — TROPONIN I: TROPONIN I: 0.03 ng/mL (ref <0.031)

## 2014-07-02 LAB — ETHANOL: Alcohol, Ethyl (B): <5 mg/dL (ref 0–9)

## 2014-07-02 LAB — APTT: aPTT: 40 seconds — ABNORMAL HIGH (ref 24–37)

## 2014-07-02 MED ORDER — SODIUM CHLORIDE 0.9 % IV BOLUS (SEPSIS)
500.0000 mL | Freq: Once | INTRAVENOUS | Status: AC
  Administered 2014-07-02: 500 mL via INTRAVENOUS

## 2014-07-02 MED ORDER — SODIUM CHLORIDE 0.9 % IV SOLN
100.0000 mL/h | INTRAVENOUS | Status: DC
  Administered 2014-07-02: 100 mL/h via INTRAVENOUS

## 2014-07-02 NOTE — Discharge Instructions (Signed)
As discussed, your evaluation today has been largely reassuring.  But, it is important that you monitor your condition carefully, and do not hesitate to return to the ED if you develop new, or concerning changes in your condition. ? ?Otherwise, please follow-up with your physician for appropriate ongoing care. ? ?

## 2014-07-02 NOTE — ED Notes (Signed)
Patient and spouse verbalize understanding of discharge instructions, home care and follow up care. Patient out of department at this time.

## 2014-07-12 ENCOUNTER — Telehealth: Payer: Self-pay | Admitting: Nurse Practitioner

## 2014-07-12 NOTE — Telephone Encounter (Signed)
Please call [facility] regarding pts

## 2014-09-22 ENCOUNTER — Other Ambulatory Visit: Payer: Self-pay | Admitting: Nurse Practitioner

## 2014-09-23 ENCOUNTER — Other Ambulatory Visit: Payer: Self-pay | Admitting: Cardiology

## 2014-09-23 NOTE — Telephone Encounter (Signed)
Last seen 03/11/14 B Oxford  Last lipid 11/29/13  This is for mail order

## 2014-10-06 ENCOUNTER — Other Ambulatory Visit: Payer: Self-pay | Admitting: Cardiology

## 2014-11-02 ENCOUNTER — Other Ambulatory Visit: Payer: Self-pay

## 2014-11-02 MED ORDER — APIXABAN 2.5 MG PO TABS: 2.5000 mg | ORAL_TABLET | Freq: Two times a day (BID) | ORAL | Status: DC

## 2014-11-02 NOTE — Telephone Encounter (Signed)
x

## 2014-11-02 NOTE — Telephone Encounter (Signed)
Last seen 03/11/14 B [place]  No upcoming appt scheduled

## 2014-11-09 ENCOUNTER — Other Ambulatory Visit: Payer: Self-pay

## 2014-11-09 MED ORDER — MIRABEGRON ER 25 MG PO TB24: 25.0000 mg | ORAL_TABLET | Freq: Every day | ORAL | Status: DC

## 2014-11-09 NOTE — Telephone Encounter (Signed)
Last seen 03/11/14 B [place]  This med not on EPIC list

## 2014-11-09 NOTE — Telephone Encounter (Signed)
This is okay 1. For future refills the patient will need to make an appointment to be seen as he has not been seen since in the fall.

## 2014-12-21 ENCOUNTER — Other Ambulatory Visit: Payer: Self-pay | Admitting: Cardiology

## 2014-12-24 ENCOUNTER — Other Ambulatory Visit: Payer: Self-pay | Admitting: Family Medicine

## 2014-12-26 NOTE — Telephone Encounter (Signed)
Last seen 03/11/14  B Oxford  Last lipid 03/09/13

## 2014-12-26 NOTE — Telephone Encounter (Signed)
Patient has difficulty leaving his home. Explained to wife that we need to see him at least once a year in order to continue to prescribe medications.  She stated understanding abut did not schedule an appointment at this time.

## 2014-12-26 NOTE — Telephone Encounter (Signed)
.  nybs

## 2015-01-12 ENCOUNTER — Other Ambulatory Visit: Payer: Self-pay | Admitting: Family

## 2015-01-12 NOTE — Telephone Encounter (Signed)
Last seen 03/11/14 B Oxford 

## 2015-01-20 ENCOUNTER — Other Ambulatory Visit: Payer: Self-pay | Admitting: Family Medicine

## 2015-01-20 NOTE — Telephone Encounter (Signed)
Last seen 11/2013 for check up

## 2015-03-07 ENCOUNTER — Other Ambulatory Visit: Payer: Self-pay | Admitting: Nurse Practitioner

## 2015-03-07 NOTE — Telephone Encounter (Signed)
I do not see a Vitamin D level in epic

## 2015-03-08 NOTE — Telephone Encounter (Signed)
vitd rx denied- patinet has not been seen in office in over a year

## 2015-03-08 NOTE — Telephone Encounter (Signed)
Detailed message left that he will need to be seen.

## 2015-03-13 ENCOUNTER — Telehealth: Payer: Self-pay | Admitting: *Deleted

## 2015-03-13 NOTE — Telephone Encounter (Signed)
Patient has not been seen within the past year. Spoke with his wife this morning and she said that he isn't able to leave the home because he uses oxygen.  He is in need of medication refills and needs to be evaluated for Beta Blocker use due to heart failure.  Contacted [facility] to see if he is a candidate to receive help from them. I don't think he is because he hasn't had a hospital admission within the past year.  He does have multiple complicating medical conditions though.  I also left a message for his daughter to return my call. I'm hoping she can provide more answers so that we can see how to best serve him.

## 2015-03-25 ENCOUNTER — Other Ambulatory Visit: Payer: Self-pay | Admitting: Nurse Practitioner

## 2015-03-25 NOTE — Telephone Encounter (Signed)
Last seen 03/11/14  B Oxford last lipid 11/29/13

## 2015-04-14 ENCOUNTER — Other Ambulatory Visit: Payer: Self-pay | Admitting: Family Medicine

## 2015-04-21 ENCOUNTER — Other Ambulatory Visit: Payer: Self-pay | Admitting: Nurse Practitioner

## 2015-05-17 ENCOUNTER — Telehealth: Payer: Self-pay | Admitting: Family Medicine

## 2015-07-17 ENCOUNTER — Other Ambulatory Visit: Payer: Self-pay

## 2015-07-17 ENCOUNTER — Encounter: Payer: Self-pay | Admitting: Nurse Practitioner

## 2015-07-17 ENCOUNTER — Ambulatory Visit (INDEPENDENT_AMBULATORY_CARE_PROVIDER_SITE_OTHER): Payer: BLUE CROSS/BLUE SHIELD | Admitting: Nurse Practitioner

## 2015-07-17 VITALS — BP 152/70 | HR 58 | Temp 97.1°F

## 2015-07-17 DIAGNOSIS — Z8669 Personal history of other diseases of the nervous system and sense organs: Secondary | ICD-10-CM

## 2015-07-17 DIAGNOSIS — I482 Chronic atrial fibrillation, unspecified: Secondary | ICD-10-CM

## 2015-07-17 DIAGNOSIS — E876 Hypokalemia: Secondary | ICD-10-CM

## 2015-07-17 DIAGNOSIS — E669 Obesity, unspecified: Secondary | ICD-10-CM | POA: Diagnosis not present

## 2015-07-17 DIAGNOSIS — I10 Essential (primary) hypertension: Secondary | ICD-10-CM

## 2015-07-17 DIAGNOSIS — D696 Thrombocytopenia, unspecified: Secondary | ICD-10-CM | POA: Diagnosis not present

## 2015-07-17 DIAGNOSIS — E785 Hyperlipidemia, unspecified: Secondary | ICD-10-CM | POA: Diagnosis not present

## 2015-07-17 DIAGNOSIS — R3915 Urgency of urination: Secondary | ICD-10-CM

## 2015-07-17 DIAGNOSIS — R531 Weakness: Secondary | ICD-10-CM

## 2015-07-17 DIAGNOSIS — R001 Bradycardia, unspecified: Secondary | ICD-10-CM

## 2015-07-17 DIAGNOSIS — N179 Acute kidney failure, unspecified: Secondary | ICD-10-CM

## 2015-07-17 DIAGNOSIS — E119 Type 2 diabetes mellitus without complications: Secondary | ICD-10-CM | POA: Diagnosis not present

## 2015-07-17 DIAGNOSIS — Z87828 Personal history of other (healed) physical injury and trauma: Secondary | ICD-10-CM | POA: Diagnosis not present

## 2015-07-17 DIAGNOSIS — R4182 Altered mental status, unspecified: Secondary | ICD-10-CM | POA: Diagnosis not present

## 2015-07-17 LAB — BAYER DCA HB A1C WAIVED: HB A1C: 7.3 % — AB (ref <7.0)

## 2015-07-17 MED ORDER — APIXABAN 2.5 MG PO TABS: 2.5000 mg | ORAL_TABLET | Freq: Two times a day (BID) | ORAL | Status: DC

## 2015-07-17 MED ORDER — LEVETIRACETAM 500 MG PO TABS: 500.0000 mg | ORAL_TABLET | Freq: Two times a day (BID) | ORAL | Status: DC

## 2015-07-17 MED ORDER — POTASSIUM CHLORIDE CRYS ER 20 MEQ PO TBCR: EXTENDED_RELEASE_TABLET | ORAL | Status: DC

## 2015-07-17 MED ORDER — MIRABEGRON ER 25 MG PO TB24: 25.0000 mg | ORAL_TABLET | Freq: Every day | ORAL | Status: DC

## 2015-07-17 MED ORDER — FUROSEMIDE 40 MG PO TABS: 20.0000 mg | ORAL_TABLET | Freq: Two times a day (BID) | ORAL | Status: DC
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
## 2015-07-17 NOTE — Patient Instructions (Signed)
Fall Prevention in the Home  Falls can cause injuries and can affect people from all age groups. There are many simple things that you can do to make your home safe and to help prevent falls. WHAT CAN I DO ON THE OUTSIDE OF MY HOME?  Regularly repair the edges of walkways and driveways and fix any cracks.  Remove high doorway thresholds.  Trim any shrubbery on the main path into your home.  Use bright outdoor lighting.  Clear walkways of debris and clutter, including tools and rocks.  Regularly check that handrails are securely fastened and in good repair. Both sides of any steps should have handrails.  Install guardrails along the edges of any raised decks or porches.  Have leaves, snow, and ice cleared regularly.  Use sand or salt on walkways during winter months.  In the garage, clean up any spills right away, including grease or oil spills. WHAT CAN I DO IN THE BATHROOM?  Use night lights.  Install grab bars by the toilet and in the tub and shower. Do not use towel bars as grab bars.  Use non-skid mats or decals on the floor of the tub or shower.  If you need to sit down while you are in the shower, use a plastic, non-slip stool..  Keep the floor dry. Immediately clean up any water that spills on the floor.  Remove soap buildup in the tub or shower on a regular basis.  Attach bath mats securely with double-sided non-slip rug tape.  Remove throw rugs and other tripping hazards from the floor. WHAT CAN I DO IN THE BEDROOM?  Use night lights.  Make sure that a bedside light is easy to reach.  Do not use oversized bedding that drapes onto the floor.  Have a firm chair that has side arms to use for getting dressed.  Remove throw rugs and other tripping hazards from the floor. WHAT CAN I DO IN THE KITCHEN?   Clean up any spills right away.  Avoid walking on wet floors.  Place frequently used items in easy-to-reach places.  If you need to reach for something  above you, use a sturdy step stool that has a grab bar.  Keep electrical cables out of the way.  Do not use floor polish or wax that makes floors slippery. If you have to use wax, make sure that it is non-skid floor wax.  Remove throw rugs and other tripping hazards from the floor. WHAT CAN I DO IN THE STAIRWAYS?  Do not leave any items on the stairs.  Make sure that there are handrails on both sides of the stairs. Fix handrails that are broken or loose. Make sure that handrails are as long as the stairways.  Check any carpeting to make sure that it is firmly attached to the stairs. Fix any carpet that is loose or worn.  Avoid having throw rugs at the top or bottom of stairways, or secure the rugs with carpet tape to prevent them from moving.  Make sure that you have a light switch at the top of the stairs and the bottom of the stairs. If you do not have them, have them installed. WHAT ARE SOME OTHER FALL PREVENTION TIPS?  Wear closed-toe shoes that fit well and support your feet. Wear shoes that have rubber soles or low heels.  When you use a stepladder, make sure that it is completely opened and that the sides are firmly locked. Have someone hold the ladder while you   are using it. Do not climb a closed stepladder.  Add color or contrast paint or tape to grab bars and handrails in your home. Place contrasting color strips on the first and last steps.  Use mobility aids as needed, such as canes, walkers, scooters, and crutches.  Turn on lights if it is dark. Replace any light bulbs that burn out.  Set up furniture so that there are clear paths. Keep the furniture in the same spot.  Fix any uneven floor surfaces.  Choose a carpet design that does not hide the edge of steps of a stairway.  Be aware of any and all pets.  Review your medicines with your healthcare provider. Some medicines can cause dizziness or changes in blood pressure, which increase your risk of falling. Talk  with your health care provider about other ways that you can decrease your risk of falls. This may include working with a physical therapist or trainer to improve your strength, balance, and endurance.   This information is not intended to replace advice given to you by your health care provider. Make sure you discuss any questions you have with your health care provider.   Document Released: 04/05/2002 Document Revised: 08/30/2014 Document Reviewed: 05/20/2014 Elsevier Interactive Patient Education 2016 Elsevier Inc.  

## 2015-07-18 LAB — LIPID PANEL
Chol/HDL Ratio: 4.6 ratio units (ref 0.0–5.0)
Cholesterol, Total: 208 mg/dL — ABNORMAL HIGH (ref 100–199)
HDL: 45 mg/dL (ref >39)
LDL Calculated: 133 mg/dL — ABNORMAL HIGH (ref 0–99)
TRIGLYCERIDES: 152 mg/dL — AB (ref 0–149)
VLDL Cholesterol Cal: 30 mg/dL (ref 5–40)

## 2015-07-18 LAB — CMP14+EGFR
A/G RATIO: 0.9 — AB (ref 1.2–2.2)
ALK PHOS: 89 IU/L (ref 39–117)
ALT: 17 IU/L (ref 0–44)
AST: 15 IU/L (ref 0–40)
Albumin: 3.5 g/dL (ref 3.5–4.7)
BILIRUBIN TOTAL: 0.3 mg/dL (ref 0.0–1.2)
BUN/Creatinine Ratio: 12 (ref 10–22)
BUN: 18 mg/dL (ref 8–27)
CALCIUM: 8.8 mg/dL (ref 8.6–10.2)
CHLORIDE: 100 mmol/L (ref 96–106)
CO2: 22 mmol/L (ref 18–29)
Creatinine, Ser: 1.5 mg/dL — ABNORMAL HIGH (ref 0.76–1.27)
GFR calc Af Amer: 48 mL/min/{1.73_m2} — ABNORMAL LOW (ref >59)
GFR, EST NON AFRICAN AMERICAN: 41 mL/min/{1.73_m2} — AB (ref >59)
Globulin, Total: 3.8 g/dL (ref 1.5–4.5)
Glucose: 150 mg/dL — ABNORMAL HIGH (ref 65–99)
POTASSIUM: 4.3 mmol/L (ref 3.5–5.2)
SODIUM: 139 mmol/L (ref 134–144)
Total Protein: 7.3 g/dL (ref 6.0–8.5)

## 2015-07-22 ENCOUNTER — Other Ambulatory Visit: Payer: Self-pay | Admitting: Family Medicine

## 2015-07-27 ENCOUNTER — Encounter: Payer: Self-pay | Admitting: *Deleted

## 2015-08-01 ENCOUNTER — Encounter: Payer: Self-pay | Admitting: Physical Therapy

## 2015-08-01 ENCOUNTER — Ambulatory Visit: Payer: Medicare Other | Attending: Nurse Practitioner | Admitting: Physical Therapy

## 2015-08-01 DIAGNOSIS — R2681 Unsteadiness on feet: Secondary | ICD-10-CM | POA: Diagnosis present

## 2015-08-01 DIAGNOSIS — M6281 Muscle weakness (generalized): Secondary | ICD-10-CM | POA: Insufficient documentation

## 2015-08-01 NOTE — Therapy (Signed)
[facility] [address] [place], [place], [address] Phone: [phone]   Fax:  [phone]  Physical Therapy Evaluation  Patient Details  Name: Taylor Jacobson MRN: [id number] Date of Birth: 1927-04-16 Referring Provider: Bennie PieriniMary-Margaret Martin FNP  Encounter Date: 08/01/2015      PT End of Session - 08/01/15 1316    Visit Number 1   Number of Visits 12   Date for PT Re-Evaluation 09/12/15   PT Start Time 1316   PT Stop Time 1351   PT Time Calculation (min) 35 min   Activity Tolerance Patient tolerated treatment well   Behavior During Therapy [facility] for tasks assessed/performed      Past Medical History  Diagnosis Date  . CAD (coronary artery disease)     a. 1997 s/p CABG x 4: LIMA->LAD, VG->Diag, VG->OM, VG->dRCA;  b. 07/2000 Cath: LM nl, LAD 6963m, RI 99p, LCX 100p, RCA 90, VG->RCA nl, VG->OM nl, VG->Diag nl, LIMA->LAD nl.  . Diabetes (HCC)   . Chronic systolic CHF (congestive heart failure) (HCC)     a. 07/2012 Echo: EF 25-30%, diff HK, triv AI, mild MR, mod dil LA, mod reduced RV syst fxn, mildly dil RA.  . Cardiomyopathy, ischemic   . Obesity   . HTN (hypertension), benign   . Hyperlipidemia   . Subdural hematoma (HCC)     a. 2009.  Marland Kitchen. OSA (obstructive sleep apnea)     a. uses CPAP  . Atrial fibrillation (HCC)     a. Eliquis started 06/2012.  Marland Kitchen. A-fib [facility](HCC)     Past Surgical History  Procedure Laterality Date  . Coronary artery bypass graft      1997 L - LAD, SVG D1, SVG OM, SVG RCA.  Grafts patent on cath 2002  . Hemorrhoid surgery    . Cholecystectomy    . Bilateral total knee replacements    . Nasal septum surgery    . Brain surgery      There were no vitals filed for this visit.  Visit Diagnosis:  Muscle weakness (generalized) - Plan: PT plan of care cert/re-cert  Unsteadiness on feet - Plan: PT plan of care cert/re-cert      Subjective Assessment - 08/01/15 1321    Subjective Patient reports two falls in  the past six months. All were in the bathroom. His wife was present. Patient says doctors office is aware. His daughter reports that he has one step down into the bathroom. She also reports that the patient's wife encourages patient to sit all day rather than walk around.   Patient is accompained by: Family member   Pertinent History Oxygen 2.0L/min, Afib, HTN, cardiomyopathy, DM, B TKR   Patient Stated Goals to move more   Currently in Pain? No/denies            [facility] PT Assessment - 08/01/15 0001    Assessment   Medical Diagnosis weakness; physical deconditioning, gait instability   Referring Provider Mary-Margaret Daphine DeutscherMartin FNP   Onset Date/Surgical Date 04/30/15   Next MD Visit not scheduled   Precautions   Precautions Fall   Balance Screen   Has the patient fallen in the past 6 months Yes   How many times? 2   Has the patient had a decrease in activity level because of a fear of falling?  No   Is the patient reluctant to leave their home because of a fear of falling?  Yes   Home [employer]   Living Environment Private  residence   Living Arrangements Spouse/significant other   Type of Home House   Home Layout One level   Additional Comments one step into bathroom   Prior Function   Level of Independence Independent with household mobility with device;Needs assistance with transfers   Vocation Retired   Functional Tests   Functional tests Step up;Sit to Stand   Step Up   Comments 4 inch step BUE support; LOB with R step up   Sit to Stand   Comments requires min A x 1   ROM / Strength   AROM / PROM / Strength Strength   Strength   Overall Strength Comments strength tested in sitting; able to perform 5 bridges in supine   Strength Assessment Site Hip;Knee   Right/Left Hip Right;Left   Right Hip Flexion 5/5   Right Hip ABduction 5/5   Left Hip Flexion 4/5   Left Hip ABduction 5/5   Right/Left Knee Right;Left   Right Knee Flexion 5/5   Right Knee Extension 5/5   Left  Knee Flexion 5/5   Left Knee Extension 4+/5   Ambulation/Gait   Ambulation/Gait Yes   Ambulation/Gait Assistance 6: Modified independent (Device/Increase time)   Ambulation Distance (Feet) 50 Feet   Assistive device Rolling walker   Gait Pattern Within Functional Limits   Ambulation Surface Level   Gait Comments adjusted RW to fit patient   Standardized Balance Assessment   Standardized Balance Assessment Five Times Sit to Stand   Five times sit to stand comments  30 sec                           PT Education - 08/01/15 1529    Education provided Yes   Education Details HEP   Person(s) Educated Patient;Child(ren)   Methods Explanation;Demonstration;Handout   Comprehension Verbalized understanding;Returned demonstration          PT Short Term Goals - 08/01/15 1534    PT SHORT TERM GOAL #1   Title I with initial HEP   Time 2   Period Weeks   Status New   PT SHORT TERM GOAL #2   Time --   Period --   Status --   PT SHORT TERM GOAL #3   Title --   Time --   Period --   Status --           PT Long Term Goals - 08/01/15 1535    PT LONG TERM GOAL #1   Title improved 5 times sit to stand to 20 seconds without assist   Time 6   Period Weeks   Status New   PT LONG TERM GOAL #2   Title Patient able to climb stairs with reciprocal gait and one UE support   Time 6   Period Weeks   Status New               Plan - 08/01/15 1354    Clinical Impression Statement Patient presents with functional weakness with transfers and stair climbing. He requires VCs to push up from chair with sit to stand and requires min A most of the time. At home he uses a lift chair. He also has weakness and instability with steps.   Pt will benefit from skilled therapeutic intervention in order to improve on the following deficits Decreased activity tolerance;Decreased balance;Decreased strength;Postural dysfunction   Rehab Potential Good   PT Frequency 3x / week    PT Duration  6 weeks   PT Treatment/Interventions ADLs/Self Care Home Management;Balance training;Patient/family education;Manual techniques;Therapeutic exercise;Stair training;Gait training   PT Next Visit Plan sit to stand, bridges, steps, balance   PT Home Exercise Plan bridging   Consulted and Agree with Plan of Care Patient          G-Codes - 19-Aug-2015 1538    Functional Assessment Tool Used Clinical Judgement   Functional Limitation Changing and maintaining body position   Changing and Maintaining Body Position Current Status (Z6109) At least 40 percent but less than 60 percent impaired, limited or restricted   Changing and Maintaining Body Position Goal Status (U0454) At least 40 percent but less than 60 percent impaired, limited or restricted       Problem List Patient Active Problem List   Diagnosis Date Noted  . Acute renal failure syndrome (HCC)   . Anemia of unknown etiology 03/12/2014  . Chronic systolic CHF (congestive heart failure) (HCC) 03/12/2014  . A-fib (HCC) 03/12/2014  . Altered mental status 03/11/2014  . Thrombocytopenia, unspecified (HCC) 08/27/2012  . Bradycardia 08/27/2012  . Heart failure, acute systolic, first episode (HCC) 09/81/1914  . CAD (coronary artery disease)   . Cardiomyopathy, ischemic   . Obesity   . Hyperlipidemia   . history of HEMORRHAGE, SUBDURAL 07/05/2008  . DIVERTICULOSIS, COLON 09/14/2007  . HEMORRHOIDS, HX OF 09/14/2007  . Essential hypertension 06/21/2007  . CEREBRAL ATROPHY 06/09/2007  . DEGENERATIVE JOINT DISEASE 06/09/2007  . 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 OBSTRUCTIVE SLEEP APNEA 06/08/2007  . CARDIAC ARRHYTHMIA 06/08/2007  . SINUSITIS 06/08/2007  . ALLERGIC RHINITIS 06/08/2007  . COLONIC POLYPS, HX OF 06/08/2007    Solon Palm PT  2015/08/19, 3:41 PM  [facility] [address] [place], [place], [address] Phone: [phone]   Fax:   [phone]  Name: Taylor Jacobson MRN: [id number] Date of Birth: 1927-02-16

## 2015-08-01 NOTE — Patient Instructions (Signed)
  Bridge Toys ''R'' UsPose   Press small of back into mat, maintain pelvic tilt, roll up one vertebrae at a time. Focus on engaging posterior hip muscles. Hold for _3 seconds. Repeat _5___ times. Do 1-3 sets of 5 repetitions.  Do up to 30 reps as you get stronger. 1-2 times per day.  Solon PalmJulie Fatin Bachicha, PT 08/01/2015 1:51 PM [facility] [address] [place], [place], [address] Phone: [phone]   Fax:  [phone]

## 2015-08-03 ENCOUNTER — Ambulatory Visit: Payer: Medicare Other | Admitting: Physical Therapy

## 2015-08-03 DIAGNOSIS — R2681 Unsteadiness on feet: Secondary | ICD-10-CM

## 2015-08-03 DIAGNOSIS — M6281 Muscle weakness (generalized): Secondary | ICD-10-CM

## 2015-08-03 NOTE — Therapy (Signed)
[facility] [address] [place], [place], [address] Phone: [phone]   Fax:  [phone]  Physical Therapy Treatment  Patient Details  Name: Taylor Jacobson MRN: [id number] Date of Birth: 11-29-1926 Referring Provider: Bennie Pierini FNP  Encounter Date: 08/03/2015      PT End of Session - 08/03/15 1424    Visit Number 2   Number of Visits 12   Date for PT Re-Evaluation 09/12/15   PT Start Time 1426   PT Stop Time 1515   PT Time Calculation (min) 49 min   Activity Tolerance Patient limited by fatigue;Patient limited by pain   Behavior During Therapy [facility] for tasks assessed/performed      Past Medical History  Diagnosis Date  . CAD (coronary artery disease)     a. 1997 s/p CABG x 4: LIMA->LAD, VG->Diag, VG->OM, VG->dRCA;  b. 07/2000 Cath: LM nl, LAD 7m, RI 99p, LCX 100p, RCA 90, VG->RCA nl, VG->OM nl, VG->Diag nl, LIMA->LAD nl.  . Diabetes (HCC)   . Chronic systolic CHF (congestive heart failure) (HCC)     a. 07/2012 Echo: EF 25-30%, diff HK, triv AI, mild MR, mod dil LA, mod reduced RV syst fxn, mildly dil RA.  . Cardiomyopathy, ischemic   . Obesity   . HTN (hypertension), benign   . Hyperlipidemia   . Subdural hematoma (HCC)     a. 2009.  Marland Kitchen OSA (obstructive sleep apnea)     a. uses CPAP  . Atrial fibrillation (HCC)     a. Eliquis started 06/2012.  Marland Kitchen A-fib [facility])     Past Surgical History  Procedure Laterality Date  . Coronary artery bypass graft      1997 L - LAD, SVG D1, SVG OM, SVG RCA.  Grafts patent on cath 2002  . Hemorrhoid surgery    . Cholecystectomy    . Bilateral total knee replacements    . Nasal septum surgery    . Brain surgery      There were no vitals filed for this visit.  Visit Diagnosis:  Unsteadiness on feet  Muscle weakness (generalized)      Subjective Assessment - 08/03/15 1429    Subjective Patient's daugther requests that patient get HEP that he can do in his chair as the  patient's wife won't let him do any others.   Patient is accompained by: Family member   Pertinent History Oxygen 2.0L/min, Afib, HTN, cardiomyopathy, DM, B TKR   Patient Stated Goals to move more   Currently in Pain? No/denies                         [facility] Adult PT Treatment/Exercise - 08/03/15 0001    Exercises   Exercises Knee/Hip   Knee/Hip Exercises: Aerobic   Nustep L2 x 5 min   Knee/Hip Exercises: Standing   Forward Step Up 10 reps;Right;Hand Hold: 2;Step Height: 6";Left   Knee/Hip Exercises: Seated   Long Arc Quad Strengthening;Both;2 sets;10 reps   Marching Strengthening;Both;2 sets;10 reps   Abduction/Adduction  Strengthening;Both;2 sets;10 reps  green band   Sit to [employer] 2 sets;5 reps   Knee/Hip Exercises: Supine   Straight Leg Raises Strengthening;5 reps;1 set;Both   Other Supine Knee/Hip Exercises seated ankle pumps 2x10   Other Supine Knee/Hip Exercises seated OH lift with cane x 8; chest press 2x5  stopped OH flexion due to R shoulder pain  PT Education - 08/03/15 1526    Education provided Yes   Education Details hep   Starwood HotelsPerson(s) Educated Patient;Child(ren)   Methods Explanation;Demonstration;Handout   Comprehension Verbalized understanding;Returned demonstration          PT Short Term Goals - 08/01/15 1534    PT SHORT TERM GOAL #1   Title I with initial HEP   Time 2   Period Weeks   Status New   PT SHORT TERM GOAL #2   Time --   Period --   Status --   PT SHORT TERM GOAL #3   Title --   Time --   Period --   Status --           PT Long Term Goals - 08/01/15 1535    PT LONG TERM GOAL #1   Title improved 5 times sit to stand to 20 seconds without assist   Time 6   Period Weeks   Status New   PT LONG TERM GOAL #2   Title Patient able to climb stairs with reciprocal gait and one UE support   Time 6   Period Weeks   Status New               Plan - 08/03/15 1527    Clinical Impression  Statement Patient did fair with TE today. He fatigues cardiovascularly quickly with step ups requiring rest in between sets.  O2 and HR monitored throughout session - after stairs O2 dropped to 90; remained at 96 with seated TE. HR highest with step ups 94bpm; with seated TE 81 bpm.   PT Next Visit Plan sit to stand, bridges, steps, seated LE/UE strengthening. O2/HR should be monitored throughout.        Problem List Patient Active Problem List   Diagnosis Date Noted  . Acute renal failure syndrome (HCC)   . Anemia of unknown etiology 03/12/2014  . Chronic systolic CHF (congestive heart failure) (HCC) 03/12/2014  . A-fib (HCC) 03/12/2014  . Altered mental status 03/11/2014  . Thrombocytopenia, unspecified (HCC) 08/27/2012  . Bradycardia 08/27/2012  . Heart failure, acute systolic, first episode (HCC) 14/78/295604/30/2014  . CAD (coronary artery disease)   . Cardiomyopathy, ischemic   . Obesity   . Hyperlipidemia   . history of HEMORRHAGE, SUBDURAL 07/05/2008  . DIVERTICULOSIS, COLON 09/14/2007  . HEMORRHOIDS, HX OF 09/14/2007  . Essential hypertension 06/21/2007  . CEREBRAL ATROPHY 06/09/2007  . DEGENERATIVE JOINT DISEASE 06/09/2007  . 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 OBSTRUCTIVE SLEEP APNEA 06/08/2007  . CARDIAC ARRHYTHMIA 06/08/2007  . SINUSITIS 06/08/2007  . ALLERGIC RHINITIS 06/08/2007  . COLONIC POLYPS, HX OF 06/08/2007   Solon PalmJulie Anja Neuzil PT  08/03/2015, 3:33 PM  [facility] [address] [place], [place], [address] Phone: [phone]   Fax:  [phone]  Name: Taylor Jacobson MRN: [id number] Date of Birth: [birth year]

## 2015-08-08 ENCOUNTER — Ambulatory Visit: Payer: Medicare Other | Admitting: *Deleted

## 2015-08-08 VITALS — HR 73

## 2015-08-08 DIAGNOSIS — R2681 Unsteadiness on feet: Secondary | ICD-10-CM

## 2015-08-08 DIAGNOSIS — M6281 Muscle weakness (generalized): Secondary | ICD-10-CM | POA: Diagnosis not present

## 2015-08-08 NOTE — Therapy (Signed)
[facility] [address] [place], [place], [address] Phone: [phone]   Fax:  [phone]  Physical Therapy Treatment  Patient Details  Name: Taylor Jacobson MRN: [id number] Date of Birth: 1926-11-07 Referring Provider: Bennie Pierini FNP  Encounter Date: 08/08/2015      PT End of Session - 08/08/15 1738    Visit Number 3   Number of Visits 12   Date for PT Re-Evaluation 09/12/15   PT Start Time 1430   PT Stop Time 1519   PT Time Calculation (min) 49 min      Past Medical History  Diagnosis Date  . CAD (coronary artery disease)     a. 1997 s/p CABG x 4: LIMA->LAD, VG->Diag, VG->OM, VG->dRCA;  b. 07/2000 Cath: LM nl, LAD 33m, RI 99p, LCX 100p, RCA 90, VG->RCA nl, VG->OM nl, VG->Diag nl, LIMA->LAD nl.  . Diabetes (HCC)   . Chronic systolic CHF (congestive heart failure) (HCC)     a. 07/2012 Echo: EF 25-30%, diff HK, triv AI, mild MR, mod dil LA, mod reduced RV syst fxn, mildly dil RA.  . Cardiomyopathy, ischemic   . Obesity   . HTN (hypertension), benign   . Hyperlipidemia   . Subdural hematoma (HCC)     a. 2009.  Marland Kitchen OSA (obstructive sleep apnea)     a. uses CPAP  . Atrial fibrillation (HCC)     a. Eliquis started 06/2012.  Marland Kitchen A-fib [facility])     Past Surgical History  Procedure Laterality Date  . Coronary artery bypass graft      1997 L - LAD, SVG D1, SVG OM, SVG RCA.  Grafts patent on cath 2002  . Hemorrhoid surgery    . Cholecystectomy    . Bilateral total knee replacements    . Nasal septum surgery    . Brain surgery      Filed Vitals:   08/08/15 1438  Pulse: 73  SpO2: 92%        Subjective Assessment - 08/08/15 1437    Subjective Patient's daugther requests that patient get HEP that he can do in his chair as the patient's wife won't let him do any others.   Patient is accompained by: Family member   Pertinent History Oxygen 2.0L/min, Afib, HTN, cardiomyopathy, DM, B TKR   Patient Stated Goals to move  more                         [facility] Adult PT Treatment/Exercise - 08/08/15 0001    Exercises   Exercises Knee/Hip   Knee/Hip Exercises: Aerobic   Nustep L1 x 10 min, rested , x  O2 92, HR 72 BPM at start,  After  O2 93% and HR 92   Knee/Hip Exercises: Seated   Long Arc Quad Strengthening;Both;10 reps;3 sets   [employer] Weight 2 lbs.   Marching Strengthening;Both;10 reps;3 sets   Abduction/Adduction  Strengthening;Both;20 reps;3 sets  green band   Sit to Sand 2 sets;5 reps;with UE support  and CGA   Knee/Hip Exercises: Supine   Other Supine Knee/Hip Exercises seated ankle pumps 2x10   Other Supine Knee/Hip Exercises seate UE diagonal reaching 3x20 each side                  PT Short Term Goals - 08/01/15 1534    PT SHORT TERM GOAL #1   Title I with initial HEP   Time 2   Period Weeks  Status New   PT SHORT TERM GOAL #2   Time --   Period --   Status --   PT SHORT TERM GOAL #3   Title --   Time --   Period --   Status --           PT Long Term Goals - 08/01/15 1535    PT LONG TERM GOAL #1   Title improved 5 times sit to stand to 20 seconds without assist   Time 6   Period Weeks   Status New   PT LONG TERM GOAL #2   Title Patient able to climb stairs with reciprocal gait and one UE support   Time 6   Period Weeks   Status New               Plan - 08/08/15 1449    Clinical Impression Statement Pt did fairly well today and was able to perform all exs and act's with O2 above 90%  and HR at good levels. He needs some rest breaks between exs, but did well.    Rehab Potential Good   PT Frequency 3x / week   PT Duration 6 weeks   PT Treatment/Interventions ADLs/Self Care Home Management;Balance training;Patient/family education;Manual techniques;Therapeutic exercise;Stair training;Gait training   PT Next Visit Plan sit to stand, bridges, steps, seated LE/UE strengthening. O2/HR should be monitored throughout.   PT Home  Exercise Plan bridging      Patient will benefit from skilled therapeutic intervention in order to improve the following deficits and impairments:  Decreased activity tolerance, Decreased balance, Decreased strength, Postural dysfunction  Visit Diagnosis: Unsteadiness on feet  Muscle weakness (generalized)     Problem List Patient Active Problem List   Diagnosis Date Noted  . Acute renal failure syndrome (HCC)   . Anemia of unknown etiology 03/12/2014  . Chronic systolic CHF (congestive heart failure) (HCC) 03/12/2014  . A-fib (HCC) 03/12/2014  . Altered mental status 03/11/2014  . Thrombocytopenia, unspecified (HCC) 08/27/2012  . Bradycardia 08/27/2012  . Heart failure, acute systolic, first episode (HCC) 16/10/960404/30/2014  . CAD (coronary artery disease)   . Cardiomyopathy, ischemic   . Obesity   . Hyperlipidemia   . history of HEMORRHAGE, SUBDURAL 07/05/2008  . DIVERTICULOSIS, COLON 09/14/2007  . HEMORRHOIDS, HX OF 09/14/2007  . Essential hypertension 06/21/2007  . CEREBRAL ATROPHY 06/09/2007  . DEGENERATIVE JOINT DISEASE 06/09/2007  . 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 OBSTRUCTIVE SLEEP APNEA 06/08/2007  . CARDIAC ARRHYTHMIA 06/08/2007  . SINUSITIS 06/08/2007  . ALLERGIC RHINITIS 06/08/2007  . COLONIC POLYPS, HX OF 06/08/2007    Binyomin Brann,CHRIS , PTA 08/08/2015, 5:51 PM  [facility] [address] [place], [place], [address] Phone: [phone]   Fax:  [phone]  Name: Taylor Jacobson MRN: [id number] Date of Birth: 09/25/1926

## 2015-08-10 ENCOUNTER — Encounter: Payer: TRICARE For Life (TFL) | Admitting: *Deleted

## 2015-08-14 ENCOUNTER — Ambulatory Visit: Payer: Medicare Other | Admitting: Physical Therapy

## 2015-08-14 VITALS — BP 146/74 | HR 68

## 2015-08-14 DIAGNOSIS — M6281 Muscle weakness (generalized): Secondary | ICD-10-CM | POA: Diagnosis not present

## 2015-08-14 DIAGNOSIS — R2681 Unsteadiness on feet: Secondary | ICD-10-CM

## 2015-08-14 NOTE — Therapy (Signed)
[facility] [address] [place], [place], [address] Phone: [phone]   Fax:  [phone]  Physical Therapy Treatment  Patient Details  Name: Taylor Jacobson MRN: [id number] Date of Birth: 09-Sep-1926 Referring Provider: Bennie PieriniMary-Margaret Martin FNP  Encounter Date: 08/14/2015      PT End of Session - 08/14/15 1432    Visit Number 4   Number of Visits 12   Date for PT Re-Evaluation 09/12/15   PT Start Time 1430   PT Stop Time 1513   PT Time Calculation (min) 43 min   Activity Tolerance Patient limited by fatigue;Patient tolerated treatment well   Behavior During Therapy [facility] for tasks assessed/performed      Past Medical History  Diagnosis Date  . CAD (coronary artery disease)     a. 1997 s/p CABG x 4: LIMA->LAD, VG->Diag, VG->OM, VG->dRCA;  b. 07/2000 Cath: LM nl, LAD 6466m, RI 99p, LCX 100p, RCA 90, VG->RCA nl, VG->OM nl, VG->Diag nl, LIMA->LAD nl.  . Diabetes (HCC)   . Chronic systolic CHF (congestive heart failure) (HCC)     a. 07/2012 Echo: EF 25-30%, diff HK, triv AI, mild MR, mod dil LA, mod reduced RV syst fxn, mildly dil RA.  . Cardiomyopathy, ischemic   . Obesity   . HTN (hypertension), benign   . Hyperlipidemia   . Subdural hematoma (HCC)     a. 2009.  Marland Kitchen. OSA (obstructive sleep apnea)     a. uses CPAP  . Atrial fibrillation (HCC)     a. Eliquis started 06/2012.  Marland Kitchen. A-fib [facility](HCC)     Past Surgical History  Procedure Laterality Date  . Coronary artery bypass graft      1997 L - LAD, SVG D1, SVG OM, SVG RCA.  Grafts patent on cath 2002  . Hemorrhoid surgery    . Cholecystectomy    . Bilateral total knee replacements    . Nasal septum surgery    . Brain surgery      Filed Vitals:   08/14/15 1427  BP: 146/74  Pulse: 68  SpO2: 97%        Subjective Assessment - 08/14/15 1432    Subjective No new complaints from patient today.   Patient is accompained by: Family member  Daughter and granddaughter   Pertinent  History Oxygen 2.0L/min, Afib, HTN, cardiomyopathy, DM, B TKR   Patient Stated Goals to move more   Currently in Pain? No/denies            [facility] - 08/14/15 0001    Assessment   Medical Diagnosis weakness; physical deconditioning, gait instability   Onset Date/Surgical Date 04/30/15   Next MD Visit not scheduled   Precautions   Precautions Fall                     [facility] Adult PT Treatment/Exercise - 08/14/15 0001    Knee/Hip Exercises: Aerobic   Nustep L1, seat 10 x21 min total, x4 min rest break  81 bpm and 94% after 15 min; 93%, 83 bpm after nustep   Knee/Hip Exercises: Seated   Long Arc Quad Strengthening;Both;10 reps;3 sets   Con-wayLong Arc Quad Weight 2 lbs.   Clamshell with TheraBand Green  x20 reps   Marching Strengthening;Both;10 reps;3 sets   Abduction/Adduction  Strengthening;Both;3 sets;10 reps  green theraband   Sit to Sand 2 sets;5 reps;with UE support  VCs for use of UE to push off arm rests   Knee/Hip Exercises: Supine  Other Supine Knee/Hip Exercises Seated B heel/toe raises x30 reps   Other Supine Knee/Hip Exercises seate UE diagonal reaching x20 reps; seated B horizontal abduction x10 reps                  PT Short Term Goals - 08/14/15 1433    PT SHORT TERM GOAL #1   Title I with initial HEP   Time 2   Period Weeks   Status Achieved           PT Long Term Goals - 08/01/15 1535    PT LONG TERM GOAL #1   Title improved 5 times sit to stand to 20 seconds without assist   Time 6   Period Weeks   Status New   PT LONG TERM GOAL #2   Title Patient able to climb stairs with reciprocal gait and one UE support   Time 6   Period Weeks   Status New               Plan - 08/14/15 1559    Clinical Impression Statement Patient tolerated today's treatment fairly well as patient was eager to progress repititions of exercises. Patient requested to continue NuStep for up to twenty minutes today. Patient's O2 sats  maintained above 90% during today's treatment and patient required short rest breaks and for water breaks. Patient required VCs for UE to push off arm rests on chair to allow for ease of transfer and also required CGA. Patient continues to use O2 and FWW for ambulation.    Rehab Potential Good   PT Frequency 3x / week   PT Duration 6 weeks   PT Treatment/Interventions ADLs/Self Care Home Management;Balance training;Patient/family education;Manual techniques;Therapeutic exercise;Stair training;Gait training   PT Next Visit Plan Continue seated LE/UE strengthening with vitals monitored throughout treatment per MPT POC.   Consulted and Agree with Plan of Care Patient      Patient will benefit from skilled therapeutic intervention in order to improve the following deficits and impairments:  Decreased activity tolerance, Decreased balance, Decreased strength, Postural dysfunction  Visit Diagnosis: Unsteadiness on feet  Muscle weakness (generalized)     Problem List Patient Active Problem List   Diagnosis Date Noted  . Acute renal failure syndrome (HCC)   . Anemia of unknown etiology 03/12/2014  . Chronic systolic CHF (congestive heart failure) (HCC) 03/12/2014  . A-fib (HCC) 03/12/2014  . Altered mental status 03/11/2014  . Thrombocytopenia, unspecified (HCC) 08/27/2012  . Bradycardia 08/27/2012  . Heart failure, acute systolic, first episode (HCC) 04/54/0981  . CAD (coronary artery disease)   . Cardiomyopathy, ischemic   . Obesity   . Hyperlipidemia   . history of HEMORRHAGE, SUBDURAL 07/05/2008  . DIVERTICULOSIS, COLON 09/14/2007  . HEMORRHOIDS, HX OF 09/14/2007  . Essential hypertension 06/21/2007  . CEREBRAL ATROPHY 06/09/2007  . DEGENERATIVE JOINT DISEASE 06/09/2007  . 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 OBSTRUCTIVE SLEEP APNEA 06/08/2007  . CARDIAC ARRHYTHMIA 06/08/2007  . SINUSITIS 06/08/2007  . ALLERGIC RHINITIS 06/08/2007  . COLONIC POLYPS, HX OF  06/08/2007    Evelene Croon, PTA 08/14/2015, 4:04 PM  [facility] [address] [place], [place], [address] Phone: [phone]   Fax:  [phone]  Name: Otis Portal MRN: [id number] Date of Birth: 10/19/1926

## 2015-08-16 ENCOUNTER — Encounter: Payer: Self-pay | Admitting: Physical Therapy

## 2015-08-16 ENCOUNTER — Ambulatory Visit: Payer: Medicare Other | Admitting: Physical Therapy

## 2015-08-16 DIAGNOSIS — R2681 Unsteadiness on feet: Secondary | ICD-10-CM

## 2015-08-16 DIAGNOSIS — M6281 Muscle weakness (generalized): Secondary | ICD-10-CM | POA: Diagnosis not present

## 2015-08-16 NOTE — Therapy (Addendum)
[facility] [place], [place], [address] Phone: [phone]   Fax:  [phone]  Physical Therapy Treatment  Patient Details  Name: Taylor Jacobson MRN: [id number] Date of Birth: 22-Jun-1926 Referring Provider: Chevis Pretty FNP  Encounter Date: 08/16/2015      PT End of Session - 08/16/15 1411    Visit Number 5   Number of Visits 12   Date for PT Re-Evaluation 09/12/15   PT Start Time 1351   PT Stop Time 1433   PT Time Calculation (min) 42 min   Activity Tolerance Patient limited by fatigue;Patient tolerated treatment well   Behavior During Therapy [facility] for tasks assessed/performed      Past Medical History  Diagnosis Date  . CAD (coronary artery disease)     a. 1997 s/p CABG x 4: LIMA->LAD, VG->Diag, VG->OM, VG->dRCA;  b. 07/2000 Cath: LM nl, LAD 44m RI 99p, LCX 100p, RCA 90, VG->RCA nl, VG->OM nl, VG->Diag nl, LIMA->LAD nl.  . Diabetes ([place]   . Chronic systolic CHF (congestive heart failure) ([place]     a. 07/2012 Echo: EF 25-30%, diff HK, triv AI, mild MR, mod dil LA, mod reduced RV syst fxn, mildly dil RA.  . Cardiomyopathy, ischemic   . Obesity   . HTN (hypertension), benign   . Hyperlipidemia   . Subdural hematoma ([place]     a. 2009.  .Marland KitchenOSA (obstructive sleep apnea)     a. uses CPAP  . Atrial fibrillation ([place]     a. Eliquis started 06/2012.  .Marland KitchenA-fib ([facility]     Past Surgical History  Procedure Laterality Date  . Coronary artery bypass graft      1997 L - LAD, SVG D1, SVG OM, SVG RCA.  Grafts patent on cath 2002  . Hemorrhoid surgery    . Cholecystectomy    . Bilateral total knee replacements    . Nasal septum surgery    . Brain surgery      There were no vitals filed for this visit.      Subjective Assessment - 08/16/15 1355    Subjective No new complaints from patient today. Pre tx vitals BP 123/69 , HR 75,  O2 98%   Patient is accompained by: Family member   Pertinent History Oxygen  2.0L/min, Afib, HTN, cardiomyopathy, DM, B TKR   Patient Stated Goals to move more   Currently in Pain? No/denies                         [facility] Adult PT Treatment/Exercise - 08/16/15 0001    Knee/Hip Exercises: Aerobic   Nustep L1 x129m UE/LE, monitored for progression Vitals after BP 121/71, HR 74, O2 96   Knee/Hip Exercises: Seated   Long Arc Quad Strengthening;Both;10 reps;3 sets   [employer] 2 lbs.   Clamshell with TheraBand Green  3x10   Marching Strengthening;Both;10 reps;2 sets  2# bil   Abduction/Adduction  Strengthening;Both;3 sets;10 reps  add squeeze with ball   Sit to Sand 2 sets;5 reps;with UE support   Knee/Hip Exercises: Supine   Other Supine Knee/Hip Exercises Seated B heel/toe raises x30 reps   Other Supine Knee/Hip Exercises seate UE diagonal reaching x20 reps; seated B horizontal abduction x10 reps                  PT Short Term Goals - 08/14/15 1433    PT SHORT TERM GOAL #1  Title I with initial HEP   Time 2   Period Weeks   Status Achieved           PT Long Term Goals - 08/16/15 1410    PT LONG TERM GOAL #1   Title improved 5 times sit to stand to 20 seconds without assist   Time 6   Period Weeks   Status On-going   PT LONG TERM GOAL #2   Title Patient able to climb stairs with reciprocal gait and one UE support   Time 6   Period Weeks   Status On-going               Plan - 08/16/15 1424    Clinical Impression Statement Patient progressing with treatment and able to progress with exercises yet cues required with some rest breaks. Patient vitals WNL pre, during and post treament.BP 123/70 HR 75 O2 98% patient only required CGA with transfer and sit to stand. Goals ongoing due to balance  and mobility deficits.   Rehab Potential Good   PT Frequency 3x / week   PT Duration 6 weeks   PT Treatment/Interventions ADLs/Self Care Home Management;Balance training;Patient/family education;Manual  techniques;Therapeutic exercise;Stair training;Gait training   PT Next Visit Plan Continue seated LE/UE strengthening with vitals monitored throughout treatment per MPT POC.   Consulted and Agree with Plan of Care Patient      Patient will benefit from skilled therapeutic intervention in order to improve the following deficits and impairments:  Decreased activity tolerance, Decreased balance, Decreased strength, Postural dysfunction  Visit Diagnosis: Unsteadiness on feet  Muscle weakness (generalized)     Problem List Patient Active Problem List   Diagnosis Date Noted  . Acute renal failure syndrome ([place])   . Anemia of unknown etiology 03/12/2014  . Chronic systolic CHF (congestive heart failure) ([place]) 03/12/2014  . A-fib ([place]) 03/12/2014  . Altered mental status 03/11/2014  . Thrombocytopenia, unspecified ([place]) 08/27/2012  . Bradycardia 08/27/2012  . Heart failure, acute systolic, first episode ([place]) 08/26/2012  . CAD (coronary artery disease)   . Cardiomyopathy, ischemic   . Obesity   . Hyperlipidemia   . history of HEMORRHAGE, SUBDURAL 07/05/2008  . DIVERTICULOSIS, COLON 09/14/2007  . HEMORRHOIDS, HX OF 09/14/2007  . Essential hypertension 06/21/2007  . CEREBRAL ATROPHY 06/09/2007  . DEGENERATIVE JOINT DISEASE 06/09/2007  . DM type 2 causing renal disease ([place]) 06/08/2007  . OBSTRUCTIVE SLEEP APNEA 06/08/2007  . CARDIAC ARRHYTHMIA 06/08/2007  . SINUSITIS 06/08/2007  . ALLERGIC RHINITIS 06/08/2007  . COLONIC POLYPS, HX OF 06/08/2007    Emeri Estill P, PTA 08/16/2015, 2:33 PM  [facility] [place], [place], [address] Phone: [phone]   Fax:  [phone]  Name: Taylor Jacobson MRN: [id number] Date of Birth: Nov 14, 1926  PHYSICAL THERAPY DISCHARGE SUMMARY  Visits from Start of Care: 5.  Current functional level related to goals / functional outcomes: See above.   Remaining  deficits: Continued gait instability.   Education / Equipment: HEP. Plan: Patient agrees to discharge.  Patient goals were not met. Patient is being discharged due to not returning since the last visit.  ?????       Mali Applegate MPT

## 2015-08-21 ENCOUNTER — Telehealth: Payer: Self-pay | Admitting: *Deleted

## 2015-08-21 NOTE — Telephone Encounter (Signed)
Sent patient to ER for evaluation

## 2015-08-22 ENCOUNTER — Encounter: Payer: TRICARE For Life (TFL) | Admitting: Physical Therapy

## 2015-08-24 ENCOUNTER — Encounter: Payer: TRICARE For Life (TFL) | Admitting: Physical Therapy

## 2015-08-25 ENCOUNTER — Other Ambulatory Visit: Payer: Self-pay | Admitting: Nurse Practitioner

## 2015-08-25 ENCOUNTER — Other Ambulatory Visit: Payer: Self-pay

## 2015-08-25 NOTE — Telephone Encounter (Signed)
Last seen 07/17/15 MMM  No  Vit D level in EPIC

## 2015-08-28 MED ORDER — VITAMIN D (ERGOCALCIFEROL) 1.25 MG (50000 UNIT) PO CAPS: 50000.0000 [IU] | ORAL_CAPSULE | ORAL | Status: DC

## 2015-09-20 ENCOUNTER — Telehealth: Payer: Self-pay | Admitting: Nurse Practitioner

## 2015-09-20 NOTE — Telephone Encounter (Signed)
Pt given appt 09/26/15 at 2:00.

## 2015-09-22 ENCOUNTER — Ambulatory Visit: Payer: TRICARE For Life (TFL) | Admitting: Nurse Practitioner

## 2015-09-26 ENCOUNTER — Encounter: Payer: Self-pay | Admitting: Nurse Practitioner

## 2015-09-26 ENCOUNTER — Ambulatory Visit (INDEPENDENT_AMBULATORY_CARE_PROVIDER_SITE_OTHER): Payer: Medicare Other | Admitting: Nurse Practitioner

## 2015-09-26 VITALS — BP 90/49 | HR 65 | Temp 96.9°F | Ht 69.0 in | Wt 200.0 lb

## 2015-09-26 DIAGNOSIS — N390 Urinary tract infection, site not specified: Secondary | ICD-10-CM | POA: Diagnosis not present

## 2015-09-26 DIAGNOSIS — Z978 Presence of other specified devices: Secondary | ICD-10-CM

## 2015-09-26 DIAGNOSIS — Z09 Encounter for follow-up examination after completed treatment for conditions other than malignant neoplasm: Secondary | ICD-10-CM

## 2015-09-26 DIAGNOSIS — I6300 Cerebral infarction due to thrombosis of unspecified precerebral artery: Secondary | ICD-10-CM

## 2015-09-26 DIAGNOSIS — Z9289 Personal history of other medical treatment: Secondary | ICD-10-CM | POA: Diagnosis not present

## 2015-09-26 DIAGNOSIS — I639 Cerebral infarction, unspecified: Secondary | ICD-10-CM | POA: Insufficient documentation

## 2015-09-26 DIAGNOSIS — T83511A Infection and inflammatory reaction due to indwelling urethral catheter, initial encounter: Secondary | ICD-10-CM

## 2015-09-26 DIAGNOSIS — Z960 Presence of urogenital implants: Secondary | ICD-10-CM

## 2015-09-26 LAB — URINALYSIS, COMPLETE
BILIRUBIN UA: NEGATIVE
GLUCOSE, UA: NEGATIVE
Ketones, UA: NEGATIVE
Nitrite, UA: NEGATIVE
Specific Gravity, UA: 1.025 (ref 1.005–1.030)
UUROB: 0.2 mg/dL (ref 0.2–1.0)
pH, UA: 7 (ref 5.0–7.5)

## 2015-09-26 LAB — MICROSCOPIC EXAMINATION: Epithelial Cells (non renal): >10 /hpf — AB (ref 0–10)

## 2015-09-26 MED ORDER — CIPROFLOXACIN HCL 500 MG PO TABS: 500.0000 mg | ORAL_TABLET | Freq: Two times a day (BID) | ORAL | Status: DC

## 2015-09-26 NOTE — Progress Notes (Addendum)
   Subjective:    Patient ID: Taylor Jacobson, male    DOB: 01-Aug-1926, 82 y.o.   MRN: [id number]  HPI Patient brought in by daughter today for nursing home discharge follow up. He has multiple medical problems and is on multiple medications, which are listed below.  He had a stroke in April and was [facility]. Stroke was on right side of brain- was in hospital for around 4-5 days and then was discharged [facility] for [facility]. He was to get PT there. WHile in PT they did not walk him at all but he has been walking since he got home. He was discharged home with a foley catheter in place.DAughter does not know why they did not take foley out before discharge home. Home health is coming i to continue therapy, and they told daughter to ask us about catheter. He is doing quite well- eating good- walking with walker in home.   Review of Systems  Constitutional: Negative.   HENT: Negative.   Respiratory: Negative.   Cardiovascular: Negative.   Genitourinary: Negative.   Neurological: Negative.   Psychiatric/Behavioral: Negative.   All other systems reviewed and are negative.      Objective:   Physical Exam  Constitutional: He is oriented to person, place, and time. He appears well-developed and well-nourished. No distress.  Eyes: Conjunctivae are normal. Pupils are equal, round, and reactive to light.  Neck: Normal range of motion. Neck supple.  Cardiovascular: Normal rate, regular rhythm and normal heart sounds.   Pulmonary/Chest: Effort normal and breath sounds normal.  O2 via cannulla  Abdominal: Soft. Bowel sounds are normal.  Genitourinary:  Foley bag contains 100cc amber urine with lots of sedament.  Musculoskeletal:  Left sided hand weakness.  Neurological: He is alert and oriented to person, place, and time. He has normal reflexes. No cranial nerve deficit.  Skin: Skin is warm.  Psychiatric: He has a normal mood and affect. His  behavior is normal. Judgment and thought content normal.   BP 90/49 mmHg  Pulse 65  Temp(Src) 96.9 F (36.1 C) (Oral)  Ht 5\' 9"  (1.753 m)  Wt 200 lb (90.719 kg)  BMI 29.52 kg/m2  SpO2 93%        Assessment & Plan:   1. Hospital discharge follow-up   2. Cerebrovascular accident (CVA) due to thrombosis of precerebral artery [facility](HCC)    Hospital records reviewed Will remove catheter- if does not void in 8 hours may need to go to ER to reinsert.- family understands Continue all meds-hold K+ until get BMP results RTO in 1 month follow up  3. UTI -cipro 500 1 po BID X10 #20 Culture ordered  Mary-Margaret Daphine DeutscherMartin, FNP

## 2015-09-26 NOTE — Addendum Note (Signed)
Addended by: Bennie PieriniMARTIN, MARY-MARGARET on: 09/26/2015 02:50 PM   Modules accepted: Orders

## 2015-09-26 NOTE — Addendum Note (Signed)
Addended by: Bennie PieriniMARTIN, MARY-MARGARET on: 09/26/2015 03:07 PM   Modules accepted: Orders

## 2015-09-27 ENCOUNTER — Telehealth: Payer: Self-pay | Admitting: Nurse Practitioner

## 2015-09-28 LAB — URINE CULTURE

## 2015-10-02 ENCOUNTER — Other Ambulatory Visit: Payer: Self-pay | Admitting: Nurse Practitioner

## 2015-10-02 MED ORDER — SULFAMETHOXAZOLE-TRIMETHOPRIM 800-160 MG PO TABS: 1.0000 | ORAL_TABLET | Freq: Two times a day (BID) | ORAL | Status: DC

## 2015-10-13 ENCOUNTER — Other Ambulatory Visit: Payer: Self-pay | Admitting: Nurse Practitioner

## 2015-10-13 NOTE — Telephone Encounter (Signed)
Last seen MMM   No Vit D level in EPIC

## 2015-10-13 NOTE — Telephone Encounter (Signed)
Patient needs a vitamin D level checked before we continue these high doses. In the meantime he can take [phone] units of vitamin D daily

## 2015-10-24 ENCOUNTER — Ambulatory Visit: Payer: TRICARE For Life (TFL) | Admitting: Nurse Practitioner

## 2015-10-25 ENCOUNTER — Encounter: Payer: Self-pay | Admitting: Nurse Practitioner

## 2015-10-25 ENCOUNTER — Telehealth: Payer: Self-pay | Admitting: Pediatrics

## 2015-10-25 ENCOUNTER — Telehealth: Payer: Self-pay | Admitting: *Deleted

## 2015-10-25 ENCOUNTER — Other Ambulatory Visit: Payer: Self-pay | Admitting: *Deleted

## 2015-10-25 DIAGNOSIS — J189 Pneumonia, unspecified organism: Secondary | ICD-10-CM

## 2015-10-25 MED ORDER — LEVOFLOXACIN 750 MG PO TABS: 750.0000 mg | ORAL_TABLET | ORAL | Status: DC

## 2015-10-25 NOTE — Telephone Encounter (Signed)
Pt home bound with increased sputum, on 4L O2 at home, O2 sats low 90s. Increased green sputum, feels "under the weather" still, temp 99.8 earlier today. Home CXR with bilateral opacities. Will treat with levofloxacin q48h due to renal insuffieincy, family to call tomorrow with update on progress.

## 2015-10-25 NOTE — Telephone Encounter (Signed)
Pt has a low grade temp of 99.8, thick greenish sputum and bilateral rales in both lungs. Dr. Oswaldo DoneVincent gave V.O. For a mobile CXR

## 2015-10-26 ENCOUNTER — Telehealth: Payer: Self-pay | Admitting: Nurse Practitioner

## 2015-10-26 MED ORDER — BENZONATATE 100 MG PO CAPS: 100.0000 mg | ORAL_CAPSULE | Freq: Three times a day (TID) | ORAL | Status: DC | PRN

## 2015-10-26 NOTE — Telephone Encounter (Signed)
tessalon perle rx sent to pharmacy

## 2015-10-26 NOTE — Telephone Encounter (Signed)
Patient aware.

## 2015-10-27 ENCOUNTER — Other Ambulatory Visit: Payer: Self-pay | Admitting: Nurse Practitioner

## 2015-10-27 MED ORDER — LISINOPRIL 10 MG PO TABS: 10.0000 mg | ORAL_TABLET | Freq: Every day | ORAL | Status: DC

## 2015-11-10 ENCOUNTER — Encounter: Payer: Self-pay | Admitting: Family Medicine

## 2015-11-29 ENCOUNTER — Ambulatory Visit: Payer: TRICARE For Life (TFL) | Admitting: Pharmacist

## 2015-12-12 ENCOUNTER — Encounter: Payer: Self-pay | Admitting: Pharmacist

## 2015-12-12 ENCOUNTER — Ambulatory Visit (INDEPENDENT_AMBULATORY_CARE_PROVIDER_SITE_OTHER): Payer: Medicare Other | Admitting: Pharmacist

## 2015-12-12 VITALS — BP 101/58 | HR 60 | Ht 69.0 in | Wt 197.0 lb

## 2015-12-12 DIAGNOSIS — Z23 Encounter for immunization: Secondary | ICD-10-CM | POA: Diagnosis not present

## 2015-12-12 DIAGNOSIS — Z0000 Encounter for general adult medical examination without abnormal findings: Secondary | ICD-10-CM | POA: Diagnosis not present

## 2015-12-12 MED ORDER — LISINOPRIL 5 MG PO TABS: 5.0000 mg | ORAL_TABLET | Freq: Every day | ORAL | 3 refills | Status: DC

## 2015-12-12 NOTE — Progress Notes (Signed)
Patient ID: Clover Mealyarl Hubert Gayle, male   DOB: 07-28-1926, 82 y.o.   MRN: [id number]    Subjective:   Clover Mealyarl Hubert Escalera is a 82 y.o. male who presents for an Initial Medicare Annual Wellness Visit.  Mr. Yetta BarreJones is married.  He [place], [place]  His daughter is present with him today and is his primary care giver.    Mr. Yetta BarreJones is concerned about his teeth - his daughter states he has a partial plate that he doesn't wear most days.   She is concerned about 4 to 5 areas on his back and arms of irregular skin growth.  She states he has seen a dermatologist in past but has been several years.  She also states that the pressure sore on his buttocks has not gotten worse but is not improving since home health is not coming out for wound care.  Review of Systems  Review of Systems  Constitutional: Positive for weight loss.  HENT: Positive for hearing loss (has hearing aid but does not wear regularly).   Eyes:       Has cataracts but refuses surgery to correct   Respiratory: Negative.        Using supplemental O2  Cardiovascular: Negative.   Gastrointestinal: Negative.   Genitourinary: Negative.   Musculoskeletal: Positive for joint pain.  Skin:       Several areas on back of increased pigmentation with crusting and abnormal growth  Neurological: Positive for dizziness.  Endo/Heme/Allergies: Negative.   Psychiatric/Behavioral: Positive for memory loss.       Current Medications (verified) Outpatient Encounter Prescriptions as of 12/12/2015  Medication Sig  . apixaban (ELIQUIS) 2.5 MG TABS tablet Take 1 tablet (2.5 mg total) by mouth 2 (two) times daily.  Marland Kitchen. Dextromethorphan Polistirex (ROBITUSSIN 12 HOUR COUGH PO) Take by mouth at bedtime.  . furosemide (LASIX) 40 MG tablet TAKE (1/2) TABLET TWICE DAILY.  Marland Kitchen. levETIRAcetam (KEPPRA) 500 MG tablet Take 1 tablet (500 mg total) by mouth 2 (two) times daily.  Marland Kitchen. loratadine (CLARITIN) 10 MG tablet TAKE 1 TABLET DAILY  .  Multiple Vitamin (MULTIVITAMIN) tablet Take 1 tablet by mouth daily.  . potassium chloride SA (KLOR-CON M20) 20 MEQ tablet TAKE 1 TABLET DAILY (APPOINTMENT NEEDED FOR FUTURE REFILLS)  . [DISCONTINUED] lisinopril (PRINIVIL,ZESTRIL) 10 MG tablet Take 1 tablet (10 mg total) by mouth daily.  Marland Kitchen. lisinopril (PRINIVIL,ZESTRIL) 5 MG tablet Take 1 tablet (5 mg total) by mouth daily.  . [DISCONTINUED] benzonatate (TESSALON PERLES) 100 MG capsule Take 1 capsule (100 mg total) by mouth 3 (three) times daily as needed for cough. (Patient not taking: Reported on 12/12/2015)  . [DISCONTINUED] calcium citrate-vitamin D (CITRACAL+D) 315-200 MG-UNIT tablet Take 1 tablet by mouth 2 (two) times daily.  . [DISCONTINUED] levofloxacin (LEVAQUIN) 750 MG tablet Take 1 tablet (750 mg total) by mouth every other day. (Patient not taking: Reported on 12/12/2015)  . [DISCONTINUED] mirabegron ER (MYRBETRIQ) 25 MG TB24 tablet Take 1 tablet (25 mg total) by mouth daily. (Patient not taking: Reported on 12/12/2015)  . [DISCONTINUED] sulfamethoxazole-trimethoprim (BACTRIM DS) 800-160 MG tablet Take 1 tablet by mouth 2 (two) times daily. (Patient not taking: Reported on 12/12/2015)  . [DISCONTINUED] Vitamin D, Ergocalciferol, (DRISDOL) 50000 units CAPS capsule Take 1 capsule (50,000 Units total) by mouth every Wednesday. (Patient not taking: Reported on 12/12/2015)   No facility-administered encounter medications on file as of 12/12/2015.     Allergies (verified) Review of patient's allergies indicates  no known allergies.   History: Past Medical History:  Diagnosis Date  . A-fib (HCC)   . Atrial fibrillation (HCC)    a. Eliquis started 06/2012.  Marland Kitchen CAD (coronary artery disease)    a. 1997 s/p CABG x 4: LIMA->LAD, VG->Diag, VG->OM, VG->dRCA;  b. 07/2000 Cath: LM nl, LAD 55m, RI 99p, LCX 100p, RCA 90, VG->RCA nl, VG->OM nl, VG->Diag nl, LIMA->LAD nl.  . Cardiomyopathy, ischemic   . Cataract   . Chronic systolic CHF (congestive heart  failure) (HCC)    a. 07/2012 Echo: EF 25-30%, diff HK, triv AI, mild MR, mod dil LA, mod reduced RV syst fxn, mildly dil RA.  . Diabetes (HCC)   . HTN (hypertension), benign   . Hyperlipidemia   . Obesity   . OSA (obstructive sleep apnea)    a. uses CPAP  . Stroke (HCC)   . Subdural hematoma (HCC)    a. 2009.   Past Surgical History:  Procedure Laterality Date  . Bilateral total knee replacements    . BRAIN SURGERY    . CHOLECYSTECTOMY    . CORONARY ARTERY BYPASS GRAFT     1997 L - LAD, SVG D1, SVG OM, SVG RCA.  Grafts patent on cath 2002  . great toe nail removed - bilaterally Bilateral   . HEMORRHOID SURGERY    . NASAL SEPTUM SURGERY     Family History  Problem Relation Age of Onset  . Other Mother     died during childbirth.  . Stroke Father   . Cancer Daughter     colon   Social History   Occupational History  . Retired Soil scientist    Social History Main Topics  . Smoking status: Former Smoker    Packs/day: 0.04    Years: 0.50    Types: Cigarettes    Quit date: 52  . Smokeless tobacco: Never Used     Comment: Distant past  . Alcohol use No  . Drug use: No  . Sexual activity: Not on file    Do you feel safe at home?  Yes Are there smokers in your home (other than you)? No  Dietary issues and exercise activities discussed: Current Exercise Habits: The patient does not participate in regular exercise at present, Exercise limited by: orthopedic condition(s);respiratory conditions(s)  Current Dietary habits:  Patient is not following any specific diet at this times    Cardiac Risk Factors include: advanced age (>68men, >43 women);diabetes mellitus;dyslipidemia;male gender;sedentary lifestyle  Objective:    Today's Vitals   12/12/15 1512  BP: (!) 101/58  Pulse: 60  Weight: 197 lb (89.4 kg)  Height: 5\' 9"  (1.753 m)  PainSc: 0-No pain   Body mass index is 29.09 kg/m.   Diabetic Foot Form - Detailed   Diabetic Foot Exam -  detailed Diabetic Foot exam was performed with the following findings:  Yes 12/12/2015  9:49 PM  Visual Foot Exam completed.:  Yes  Is there a history of foot ulcer?:  No Can the patient see the bottom of their feet?:  No Are the shoes appropriate in style and fit?:  Yes Is there swelling or and abnormal foot shape?:  No Are the toenails long?:  Yes Are the toenails thick?:  Yes Do you have pain in calf while walking?:  No Is there a claw toe deformity?:  Yes Is there elevated skin temparature?:  No Is there limited skin dorsiflexion?:  No Is there foot or ankle muscle weakness?:  Yes Are the toenails ingrown?:  No Normal Range of Motion:  Yes Pulse Foot Exam completed.:  Yes  Right posterior Tibialias:  Diminished Left posterior Tibialias:  Diminished  Right Dorsalis Pedis:  Diminished Left Dorsalis Pedis:  Diminished  Swelling:  No Semmes-Weinstein Monofilament Test R Foot Test Control:  Pos L Foot Test Control:  Pos  R Site 1-Great Toe:  Pos L Site 1-Great Toe:  Pos  R Site 4:  Pos L Site 4:  Pos  R Site 5:  Pos L Site 5:  Pos          Activities of Daily Living In your present state of health, do you have any difficulty performing the following activities: 12/12/2015  Hearing? Y  Vision? Y  Difficulty concentrating or making decisions? Y  Walking or climbing stairs? Y  Dressing or bathing? Y  Doing errands, shopping? Y  Preparing Food and eating ? Y  Using the Toilet? Y  In the past six months, have you accidently leaked urine? Y  Do you have problems with loss of bowel control? N  Managing your Medications? Y  Managing your Finances? Y  Housekeeping or managing your Housekeeping? Y  Some recent data might be hidden     Depression Screen PHQ 2/9 Scores 12/12/2015 09/26/2015 07/17/2015 02/22/2014  PHQ - 2 Score 2 0 0 0  PHQ- 9 Score 4 - - -  Some recent data might be hidden     Fall Risk Fall Risk  12/12/2015 09/26/2015 07/17/2015 02/22/2014 01/04/2014  Falls in the  past year? No No Yes No No  Number falls in past yr: - - 2 or more - -  Injury with Fall? - - No - -  Risk Factor Category  - - - - -  Risk for fall due to : - Impaired balance/gait;Impaired mobility - - -  Some recent data might be hidden    Cognitive Function: MMSE - Mini Mental State Exam 12/12/2015  Not completed: Unable to complete  Orientation to time 2  Orientation to Place 2  Registration 2  Attention/ Calculation 2  Recall 1  Language- name 2 objects 2  Language- repeat 1  Language- follow 3 step command 0  Language- read & follow direction 0  Write a sentence 0  Copy design 0  Total score 12  Some recent data might be hidden    Immunizations and Health Maintenance Immunization History  Administered Date(s) Administered  . Influenza Whole 05/11/2007  . Influenza,inj,Quad PF,36+ Mos 03/09/2013, 03/14/2014  . Pneumococcal Conjugate-13 12/12/2015  . Pneumococcal Polysaccharide-23 03/09/2013, 03/14/2014   Health Maintenance Due  Topic Date Due  . TETANUS/TDAP  06/17/1947  . ZOSTAVAX  06/16/1988  . OPHTHALMOLOGY EXAM  02/27/2014  . FOOT EXAM  11/30/2014  . PNA vac Low Risk Adult (2 of 2 - PCV13) 03/15/2015  . INFLUENZA VACCINE  11/28/2015    Patient Care Team: Bennie PieriniMary-Margaret Martin, FNP as PCP - General (Family Medicine)  Adam Phenixrake, [place] - podiatrist Has dermatologist but does not recall name Hypotension - BP has been low last two checks and patient his high fall risk  Indicate any recent Medical Services you may have received from other than [facility] providers in the past year (date may be approximate).    Assessment:    Annual Wellness Visit  Food care, diabetic - nails in need of care   Screening Tests Health Maintenance  Topic Date Due  . TETANUS/TDAP  06/17/1947  . ZOSTAVAX  06/16/1988  . OPHTHALMOLOGY EXAM  02/27/2014  . FOOT EXAM  11/30/2014  . PNA vac Low Risk Adult (2 of 2 - PCV13) 03/15/2015  . INFLUENZA VACCINE  11/28/2015  . HEMOGLOBIN A1C   01/17/2016        Plan:   During the course of the visit Ary was educated and counseled about the following appropriate screening and preventive services:   Vaccines to include Pneumoccal, Influenza,  Td, Zostavax - Prevnar 13 given in office today.  Declined Td and Zostavax.  Reminded to get influenza vaccine in 1 month  Colorectal cancer screening - no longer required  Cardiovascular disease screening - UTD  BP low today with instability.  Recommended decrease lisinopril to 5mg  qd. Continue to monitor BP  Diabetes - last A1c was at goal.  Does not check BG at home  Glaucoma screening / Diabetic Eye Exam - due now, reminded to make appt  Nutrition counseling - discussed limiting sugar and high CHO foods.  Advanced Directives - asked to bring in copy  Physical Activity - chair exercises as able   Recommended patient make appt for podiatrist, dermatologist.  Offered to send referral but daughter said she would call when she could get dermatologist's name at home and she need appt for mother with Dr Ulice Brilliant so will call to make appt for both her father and mother.   Declined referral to audiologist to hearing aid check  Discussed that patient may qualify for care from [place] in [place].  They will check into this.   Will also look into wound care from home health again  Patient Instructions (the written plan) were given to the patient.   Henrene Pastor, PharmD   12/12/2015

## 2015-12-12 NOTE — Patient Instructions (Addendum)
Mr. Taylor Jacobson , Thank you for taking time to come for your Medicare Wellness Visit. I appreciate your ongoing commitment to your health goals. Please review the following plan we discussed and let me know if I can assist you in the future.   These are the goals we discussed:  Consult with [facility] ([place] in [place] clinic, dermatologist, hearing specialist and foot care  Otherwise can use current specialist:    Recommend diabetic eye exam  Recommend see dermatologist  Recommend nail care with Dr  Irving Shows.   Decrease lisinopril to '5mg'$  daily or 1/2 tablet of '10mg'$    This is a list of the screening recommended for you and due dates:  Health Maintenance  Topic Date Due  . Tetanus Vaccine  06/17/1947  . Shingles Vaccine  06/16/1988  . Eye exam for diabetics  02/27/2014  . Complete foot exam   11/30/2014  . Pneumonia vaccines (2 of 2 - PCV13) Completed today  . Flu Shot  11/28/2015  . Hemoglobin A1C  01/17/2016   Diabetes and Standards of Medical Care   Diabetes is complicated. You may find that your diabetes team includes a dietitian, nurse, diabetes educator, eye doctor, and more. To help everyone know what is going on and to help you get the care you deserve, the following schedule of care was developed to help keep you on track. Below are the tests, exams, vaccines, medicines, education, and plans you will need.  Blood Glucose Goals Prior to meals = 80 - 130 Within 2 hours of the start of a meal = less than 180  HbA1c test (goal is less than 7.0% - your last value was 7.2%) This test shows how well you have controlled your glucose over the past 2 to 3 months. It is used to see if your diabetes management plan needs to be adjusted.   It is performed at least 2 times a year if you are meeting treatment goals.  It is performed 4 times a year if therapy has changed or if you are not meeting treatment goals.  Blood pressure test  This test is performed at every  routine medical visit. The goal is less than 140/90 mmHg for most people, but 130/80 mmHg in some cases. Ask your health care provider about your goal.  Dental exam  Follow up with the dentist regularly.  Eye exam  If you are diagnosed with type 1 diabetes as a child, get an exam upon reaching the age of 72 years or older and have had diabetes for 3 to 5 years. Yearly eye exams are recommended after that initial eye exam.  If you are diagnosed with type 1 diabetes as an adult, get an exam within 5 years of diagnosis and then yearly.  If you are diagnosed with type 2 diabetes, get an exam as soon as possible after the diagnosis and then yearly.  Foot care exam  Visual foot exams are performed at every routine medical visit. The exams check for cuts, injuries, or other problems with the feet.  A comprehensive foot exam should be done yearly. This includes visual inspection as well as assessing foot pulses and testing for loss of sensation.  Check your feet nightly for cuts, injuries, or other problems with your feet. Tell your health care provider if anything is not healing.  Kidney function test (urine microalbumin)  This test is performed once a year.  Type 1 diabetes: The first test is performed 5 years after diagnosis.  Type 2 diabetes: The first test is performed at the time of diagnosis.  A serum creatinine and estimated glomerular filtration rate (eGFR) test is done once a year to assess the level of chronic kidney disease (CKD), if present.  Lipid profile (cholesterol, HDL, LDL, triglycerides)  Performed every 5 years for most people.  The goal for LDL is less than 100 mg/dL. If you are at high risk, the goal is less than 70 mg/dL.  The goal for HDL is 40 mg/dL to 50 mg/dL for men and 50 mg/dL to 60 mg/dL for women. An HDL cholesterol of 60 mg/dL or higher gives some protection against heart disease.  The goal for triglycerides is less than 150 mg/dL.  Influenza  vaccine, pneumococcal vaccine, and hepatitis B vaccine  The influenza vaccine is recommended yearly.  The pneumococcal vaccine is generally given once in a lifetime. However, there are some instances when another vaccination is recommended. Check with your health care provider.  The hepatitis B vaccine is also recommended for adults with diabetes.  Diabetes self-management education  Education is recommended at diagnosis and ongoing as needed.  Treatment plan  Your treatment plan is reviewed at every medical visit.  Document Released: 02/10/2009 Document Revised: 12/16/2012 Document Reviewed: 09/15/2012 [facility] Patient Information 2014 [place].

## 2015-12-13 ENCOUNTER — Other Ambulatory Visit: Payer: TRICARE For Life (TFL)

## 2015-12-13 ENCOUNTER — Other Ambulatory Visit: Payer: Self-pay | Admitting: Pharmacist

## 2015-12-13 DIAGNOSIS — E119 Type 2 diabetes mellitus without complications: Secondary | ICD-10-CM

## 2015-12-14 LAB — MICROALBUMIN / CREATININE URINE RATIO
Creatinine, Urine: 64.1 mg/dL
MICROALB/CREAT RATIO: 114.5 mg/g{creat} — AB (ref 0.0–30.0)
MICROALBUM., U, RANDOM: 73.4 ug/mL

## 2015-12-21 LAB — HM DIABETES EYE EXAM

## 2015-12-29 ENCOUNTER — Ambulatory Visit: Payer: Self-pay | Admitting: Nurse Practitioner

## 2016-01-02 ENCOUNTER — Encounter: Payer: Self-pay | Admitting: Nurse Practitioner

## 2016-01-03 ENCOUNTER — Other Ambulatory Visit: Payer: Self-pay | Admitting: Nurse Practitioner

## 2016-01-20 ENCOUNTER — Other Ambulatory Visit: Payer: Self-pay | Admitting: Nurse Practitioner

## 2016-01-20 DIAGNOSIS — R3915 Urgency of urination: Secondary | ICD-10-CM

## 2016-01-20 DIAGNOSIS — E876 Hypokalemia: Secondary | ICD-10-CM

## 2016-01-20 DIAGNOSIS — Z8669 Personal history of other diseases of the nervous system and sense organs: Secondary | ICD-10-CM

## 2016-01-20 DIAGNOSIS — Z87828 Personal history of other (healed) physical injury and trauma: Secondary | ICD-10-CM

## 2016-01-31 ENCOUNTER — Telehealth: Payer: Self-pay | Admitting: Nurse Practitioner

## 2016-02-01 ENCOUNTER — Other Ambulatory Visit: Payer: Self-pay | Admitting: Nurse Practitioner

## 2016-02-01 ENCOUNTER — Telehealth: Payer: Self-pay | Admitting: Nurse Practitioner

## 2016-02-01 MED ORDER — TRAZODONE HCL 50 MG PO TABS: 25.0000 mg | ORAL_TABLET | Freq: Every evening | ORAL | 3 refills | Status: DC | PRN

## 2016-02-01 NOTE — Telephone Encounter (Signed)
The only thing I would be willing to try would be trazadone- will send in rx.

## 2016-02-01 NOTE — Telephone Encounter (Signed)
Aware Rx sent to pharmacy & to let us know how this helps

## 2016-02-19 ENCOUNTER — Ambulatory Visit (INDEPENDENT_AMBULATORY_CARE_PROVIDER_SITE_OTHER): Payer: Medicare Other | Admitting: Nurse Practitioner

## 2016-02-19 ENCOUNTER — Encounter: Payer: Self-pay | Admitting: Nurse Practitioner

## 2016-02-19 VITALS — BP 115/61 | HR 64 | Temp 96.8°F

## 2016-02-19 DIAGNOSIS — E1122 Type 2 diabetes mellitus with diabetic chronic kidney disease: Secondary | ICD-10-CM

## 2016-02-19 DIAGNOSIS — I2510 Atherosclerotic heart disease of native coronary artery without angina pectoris: Secondary | ICD-10-CM | POA: Diagnosis not present

## 2016-02-19 DIAGNOSIS — I6930 Unspecified sequelae of cerebral infarction: Secondary | ICD-10-CM

## 2016-02-19 DIAGNOSIS — E785 Hyperlipidemia, unspecified: Secondary | ICD-10-CM | POA: Diagnosis not present

## 2016-02-19 DIAGNOSIS — I10 Essential (primary) hypertension: Secondary | ICD-10-CM | POA: Diagnosis not present

## 2016-02-19 DIAGNOSIS — I482 Chronic atrial fibrillation, unspecified: Secondary | ICD-10-CM

## 2016-02-19 DIAGNOSIS — Z87828 Personal history of other (healed) physical injury and trauma: Secondary | ICD-10-CM | POA: Diagnosis not present

## 2016-02-19 DIAGNOSIS — I5022 Chronic systolic (congestive) heart failure: Secondary | ICD-10-CM

## 2016-02-19 DIAGNOSIS — N289 Disorder of kidney and ureter, unspecified: Secondary | ICD-10-CM

## 2016-02-19 DIAGNOSIS — N183 Chronic kidney disease, stage 3 unspecified: Secondary | ICD-10-CM

## 2016-02-19 DIAGNOSIS — L89321 Pressure ulcer of left buttock, stage 1: Secondary | ICD-10-CM

## 2016-02-19 DIAGNOSIS — IMO0001 Reserved for inherently not codable concepts without codable children: Secondary | ICD-10-CM

## 2016-02-19 DIAGNOSIS — F5101 Primary insomnia: Secondary | ICD-10-CM | POA: Diagnosis not present

## 2016-02-19 DIAGNOSIS — E876 Hypokalemia: Secondary | ICD-10-CM | POA: Diagnosis not present

## 2016-02-19 DIAGNOSIS — E782 Mixed hyperlipidemia: Secondary | ICD-10-CM | POA: Diagnosis not present

## 2016-02-19 DIAGNOSIS — E6609 Other obesity due to excess calories: Secondary | ICD-10-CM | POA: Diagnosis not present

## 2016-02-19 DIAGNOSIS — Z8669 Personal history of other diseases of the nervous system and sense organs: Secondary | ICD-10-CM

## 2016-02-19 DIAGNOSIS — Z6841 Body Mass Index (BMI) 40.0 and over, adult: Secondary | ICD-10-CM

## 2016-02-19 DIAGNOSIS — R3915 Urgency of urination: Secondary | ICD-10-CM | POA: Diagnosis not present

## 2016-02-19 DIAGNOSIS — Z23 Encounter for immunization: Secondary | ICD-10-CM | POA: Diagnosis not present

## 2016-02-19 LAB — BAYER DCA HB A1C WAIVED: HB A1C: 6 % (ref <7.0)

## 2016-02-19 MED ORDER — APIXABAN 2.5 MG PO TABS: 2.5000 mg | ORAL_TABLET | Freq: Two times a day (BID) | ORAL | 1 refills | Status: AC

## 2016-02-19 MED ORDER — FUROSEMIDE 40 MG PO TABS: ORAL_TABLET | ORAL | 5 refills | Status: AC

## 2016-02-19 MED ORDER — TRAZODONE HCL 50 MG PO TABS: 25.0000 mg | ORAL_TABLET | Freq: Every evening | ORAL | 5 refills | Status: AC | PRN

## 2016-02-19 MED ORDER — LEVETIRACETAM 500 MG PO TABS: 500.0000 mg | ORAL_TABLET | Freq: Two times a day (BID) | ORAL | 1 refills | Status: AC

## 2016-02-19 MED ORDER — POTASSIUM CHLORIDE CRYS ER 20 MEQ PO TBCR: EXTENDED_RELEASE_TABLET | ORAL | 1 refills | Status: AC

## 2016-02-19 MED ORDER — LISINOPRIL 5 MG PO TABS: 5.0000 mg | ORAL_TABLET | Freq: Every day | ORAL | 3 refills | Status: AC

## 2016-02-19 MED ORDER — LORATADINE 10 MG PO TABS: 10.0000 mg | ORAL_TABLET | Freq: Every day | ORAL | 1 refills | Status: AC

## 2016-02-19 MED ORDER — MIRABEGRON ER 25 MG PO TB24: 25.0000 mg | ORAL_TABLET | Freq: Every day | ORAL | 1 refills | Status: DC

## 2016-02-19 NOTE — Patient Instructions (Signed)
Fall Prevention in the Home  Falls can cause injuries and can affect people from all age groups. There are many simple things that you can do to make your home safe and to help prevent falls. WHAT CAN I DO ON THE OUTSIDE OF MY HOME?  Regularly repair the edges of walkways and driveways and fix any cracks.  Remove high doorway thresholds.  Trim any shrubbery on the main path into your home.  Use bright outdoor lighting.  Clear walkways of debris and clutter, including tools and rocks.  Regularly check that handrails are securely fastened and in good repair. Both sides of any steps should have handrails.  Install guardrails along the edges of any raised decks or porches.  Have leaves, snow, and ice cleared regularly.  Use sand or salt on walkways during winter months.  In the garage, clean up any spills right away, including grease or oil spills. WHAT CAN I DO IN THE BATHROOM?  Use night lights.  Install grab bars by the toilet and in the tub and shower. Do not use towel bars as grab bars.  Use non-skid mats or decals on the floor of the tub or shower.  If you need to sit down while you are in the shower, use a plastic, non-slip stool..  Keep the floor dry. Immediately clean up any water that spills on the floor.  Remove soap buildup in the tub or shower on a regular basis.  Attach bath mats securely with double-sided non-slip rug tape.  Remove throw rugs and other tripping hazards from the floor. WHAT CAN I DO IN THE BEDROOM?  Use night lights.  Make sure that a bedside light is easy to reach.  Do not use oversized bedding that drapes onto the floor.  Have a firm chair that has side arms to use for getting dressed.  Remove throw rugs and other tripping hazards from the floor. WHAT CAN I DO IN THE KITCHEN?   Clean up any spills right away.  Avoid walking on wet floors.  Place frequently used items in easy-to-reach places.  If you need to reach for something  above you, use a sturdy step stool that has a grab bar.  Keep electrical cables out of the way.  Do not use floor polish or wax that makes floors slippery. If you have to use wax, make sure that it is non-skid floor wax.  Remove throw rugs and other tripping hazards from the floor. WHAT CAN I DO IN THE STAIRWAYS?  Do not leave any items on the stairs.  Make sure that there are handrails on both sides of the stairs. Fix handrails that are broken or loose. Make sure that handrails are as long as the stairways.  Check any carpeting to make sure that it is firmly attached to the stairs. Fix any carpet that is loose or worn.  Avoid having throw rugs at the top or bottom of stairways, or secure the rugs with carpet tape to prevent them from moving.  Make sure that you have a light switch at the top of the stairs and the bottom of the stairs. If you do not have them, have them installed. WHAT ARE SOME OTHER FALL PREVENTION TIPS?  Wear closed-toe shoes that fit well and support your feet. Wear shoes that have rubber soles or low heels.  When you use a stepladder, make sure that it is completely opened and that the sides are firmly locked. Have someone hold the ladder while you   are using it. Do not climb a closed stepladder.  Add color or contrast paint or tape to grab bars and handrails in your home. Place contrasting color strips on the first and last steps.  Use mobility aids as needed, such as canes, walkers, scooters, and crutches.  Turn on lights if it is dark. Replace any light bulbs that burn out.  Set up furniture so that there are clear paths. Keep the furniture in the same spot.  Fix any uneven floor surfaces.  Choose a carpet design that does not hide the edge of steps of a stairway.  Be aware of any and all pets.  Review your medicines with your healthcare provider. Some medicines can cause dizziness or changes in blood pressure, which increase your risk of falling. Talk  with your health care provider about other ways that you can decrease your risk of falls. This may include working with a physical therapist or trainer to improve your strength, balance, and endurance.   This information is not intended to replace advice given to you by your health care provider. Make sure you discuss any questions you have with your health care provider.   Document Released: 04/05/2002 Document Revised: 08/30/2014 Document Reviewed: 05/20/2014 Elsevier Interactive Patient Education 2016 Elsevier Inc.  

## 2016-02-19 NOTE — Progress Notes (Signed)
Subjective:    Patient ID: Taylor Jacobson, male    DOB: Aug 14, 1926, 82 y.o.   MRN: [id number]   Patient here today for follow up of chronic medical problems. Patient recently had cataract surgery and is doing well. Daughter says that he has a sore placeon left buttocks area.  Outpatient Encounter Prescriptions as of 02/19/2016  Medication Sig  . apixaban (ELIQUIS) 2.5 MG TABS tablet Take 1 tablet (2.5 mg total) by mouth 2 (two) times daily.  Marland Kitchen Dextromethorphan Polistirex (ROBITUSSIN 12 HOUR COUGH PO) Take by mouth at bedtime.  . furosemide (LASIX) 40 MG tablet TAKE (1/2) TABLET TWICE DAILY.  Marland Kitchen KLOR-CON M20 20 MEQ tablet TAKE AS INSTRUCTED BY YOUR PRESCRIBER  . levETIRAcetam (KEPPRA) 500 MG tablet TAKE 1 TABLET TWICE A DAY  . lisinopril (PRINIVIL,ZESTRIL) 5 MG tablet Take 1 tablet (5 mg total) by mouth daily.  Marland Kitchen loratadine (CLARITIN) 10 MG tablet TAKE 1 TABLET DAILY  . Multiple Vitamin (MULTIVITAMIN) tablet Take 1 tablet by mouth daily.  Marland Kitchen MYRBETRIQ 25 MG TB24 tablet TAKE 1 TABLET DAILY  . traZODone (DESYREL) 50 MG tablet Take 0.5-1 tablets (25-50 mg total) by mouth at bedtime as needed for sleep.   No facility-administered encounter medications on file as of 02/19/2016.      Hypertension  This is a chronic problem. The current episode started more than 1 year ago. The problem is unchanged. The problem is controlled. Pertinent negatives include no anxiety, peripheral edema or shortness of breath. Risk factors for coronary artery disease include diabetes mellitus, dyslipidemia, male gender and obesity. Past treatments include ACE inhibitors. There are no compliance problems.  Hypertensive end-organ damage includes CAD/MI, CVA and heart failure. There is no history of angina or kidney disease.  Hyperlipidemia  This is a chronic problem. The current episode started more than 1 year ago. Recent lipid tests were reviewed and are variable. Exacerbating diseases include diabetes and obesity.  Pertinent negatives include no shortness of breath. Current antihyperlipidemic treatment includes statins. The current treatment provides mild improvement of lipids. There are no compliance problems.  Risk factors for coronary artery disease include diabetes mellitus, hypertension, male sex and obesity.  Diabetes  He presents for his follow-up diabetic visit. He has type 2 diabetes mellitus. MedicAlert identification noted. His disease course has been fluctuating. There are no hypoglycemic associated symptoms. Pertinent negatives for diabetes include no polydipsia, no polyphagia, no polyuria, no weakness and no weight loss. There are no hypoglycemic complications. Diabetic complications include a CVA. When asked about current treatments, none were reported. He is compliant with treatment most of the time. When asked about meal planning, he reported none. He has not had a previous visit with a dietitian. He never participates in exercise. Home blood sugar record trend: does not check blood sugars at home. An ACE inhibitor/angiotensin II receptor blocker is being taken. He does not see a podiatrist.Eye exam is not current.  CHF Lasix daily helps keep edema under control hypokalemia Takes chlor con daily- no c/o lower ext cramps Cerebral atrophy/ hx subdural hematoma/ seizures Is on Keppra- has not had any recent seizures. Chronic atrial fib On eliquis daily- no c/o bleeding problems. insomnia trazadone  Nightly for sleep- helps him relax better. Urinary urgency myrbetriq helps keep him from going to the restroom so often. Late effects CVA Doing well but has remained weak- had in house physical therapy for 3 weeks then they said insurance would no longer pay for more.   Review of  Systems  Constitutional: Negative.  Negative for weight loss.  HENT: Negative.   Respiratory: Negative.  Negative for shortness of breath.   Cardiovascular: Negative.   Endocrine: Negative for polydipsia, polyphagia and  polyuria.  Neurological: Negative.  Negative for weakness.  Psychiatric/Behavioral: Negative.   All other systems reviewed and are negative.      Objective:   Physical Exam  Constitutional: He is oriented to person, place, and time. He appears well-developed and well-nourished.  HENT:  Head: Normocephalic.  Right Ear: External ear normal.  Left Ear: External ear normal.  Nose: Nose normal.  Mouth/Throat: Oropharynx is clear and moist.  Eyes: EOM are normal. Pupils are equal, round, and reactive to light.  Neck: Normal range of motion. Neck supple. No JVD present. No thyromegaly present.  Cardiovascular: Normal rate, regular rhythm, normal heart sounds and intact distal pulses.  Exam reveals no gallop and no friction rub.   No murmur heard. Pulmonary/Chest: Effort normal and breath sounds normal. No respiratory distress. He has no wheezes. He has no rales. He exhibits no tenderness.  Abdominal: Soft. Bowel sounds are normal. He exhibits no mass. There is no tenderness.  Genitourinary: Prostate normal and penis normal.  Musculoskeletal: Normal range of motion. He exhibits no edema.  Lymphadenopathy:    He has no cervical adenopathy.  Neurological: He is alert and oriented to person, place, and time. No cranial nerve deficit.  Skin: Skin is warm and dry.  Erythematous 5cm lesion on left buttocks- no drianage  Psychiatric: He has a normal mood and affect. His behavior is normal. Judgment and thought content normal.    BP 115/61   Pulse 64   Temp (!) 96.8 F (36 C) (Oral)   hgba1c 6.0%       Assessment & Plan:  1. Chronic atrial fibrillation (HCC) - apixaban (ELIQUIS) 2.5 MG TABS tablet; Take 1 tablet (2.5 mg total) by mouth 2 (two) times daily.  Dispense: 180 tablet; Refill: 1  2. Coronary artery disease involving native heart without angina pectoris, unspecified vessel or lesion type Keep follow up with cardiology  3. Chronic systolic CHF (congestive heart failure)  (HCC) - furosemide (LASIX) 40 MG tablet; TAKE (1/2) TABLET TWICE DAILY.  Dispense: 30 tablet; Refill: 5  4. Essential hypertension Do not add salt to diet - CMP14+EGFR - lisinopril (PRINIVIL,ZESTRIL) 5 MG tablet; Take 1 tablet (5 mg total) by mouth daily.  Dispense: 90 tablet; Refill: 3  5. Type 2 diabetes mellitus with stage 3 chronic kidney disease, without long-term current use of insulin (HCC) Continue  To watch carbs in diet No meds needed at this time - Bayer DCA Hb A1c Waived - Lipid panel  6. Miatd hyperlipidemia Low fat diet  7. Class 3 obesity due to excess calories with serious comorbidity and body mass index (BMI) of 45.0 to 49.9 in adult [facility]) Discussed diet and exercise for person with BMI >25 Will recheck weight in 3-6 months  8. Hyperlipidemia, unspecified hyperlipidemia type  9. Hx of post-traumatic headache - levETIRAcetam (KEPPRA) 500 MG tablet; Take 1 tablet (500 mg total) by mouth 2 (two) times daily.  Dispense: 180 tablet; Refill: 1  10. Hypokalemia - potassium chloride SA (KLOR-CON M20) 20 MEQ tablet; TAKE AS INSTRUCTED BY YOUR PRESCRIBER  Dispense: 90 tablet; Refill: 1  11. Urinary urgency  12. Primary insomnia beditme routine - traZODone (DESYREL) 50 MG tablet; Take 0.5-1 tablets (25-50 mg total) by mouth at bedtime as needed for sleep.  Dispense: 30 tablet;  Refill: 5  13. Late effect of cerebrovascular accident (CVA) Fall precautions  14. Decubitus on buttocks duoderm daily Donut ring to seat on.  Labs pending Health maintenance reviewed Diet and exercise encouraged Continue all meds Follow up  In 3 months   [place], FNP

## 2016-02-20 LAB — CMP14+EGFR
A/G RATIO: 1 — AB (ref 1.2–2.2)
ALK PHOS: 60 IU/L (ref 39–117)
ALT: 13 IU/L (ref 0–44)
AST: 16 IU/L (ref 0–40)
Albumin: 3.7 g/dL (ref 3.5–4.7)
BILIRUBIN TOTAL: 0.2 mg/dL (ref 0.0–1.2)
BUN/Creatinine Ratio: 19 (ref 10–24)
BUN: 44 mg/dL — ABNORMAL HIGH (ref 8–27)
CHLORIDE: 103 mmol/L (ref 96–106)
CO2: 27 mmol/L (ref 18–29)
Calcium: 9.5 mg/dL (ref 8.6–10.2)
Creatinine, Ser: 2.36 mg/dL — ABNORMAL HIGH (ref 0.76–1.27)
GFR calc Af Amer: 28 mL/min/{1.73_m2} — ABNORMAL LOW (ref >59)
GFR calc non Af Amer: 24 mL/min/{1.73_m2} — ABNORMAL LOW (ref >59)
GLOBULIN, TOTAL: 3.8 g/dL (ref 1.5–4.5)
Glucose: 126 mg/dL — ABNORMAL HIGH (ref 65–99)
POTASSIUM: 5 mmol/L (ref 3.5–5.2)
SODIUM: 144 mmol/L (ref 134–144)
Total Protein: 7.5 g/dL (ref 6.0–8.5)

## 2016-02-20 LAB — LIPID PANEL
CHOLESTEROL TOTAL: 174 mg/dL (ref 100–199)
Chol/HDL Ratio: 4.1 ratio units (ref 0.0–5.0)
HDL: 42 mg/dL (ref >39)
LDL Calculated: 113 mg/dL — ABNORMAL HIGH (ref 0–99)
TRIGLYCERIDES: 94 mg/dL (ref 0–149)
VLDL Cholesterol Cal: 19 mg/dL (ref 5–40)

## 2016-02-20 NOTE — Addendum Note (Signed)
Addended by: Quay BurowPOTTER, [place] Ridge K on: 02/20/2016 09:54 AM   Modules accepted: Orders

## 2016-03-15 ENCOUNTER — Other Ambulatory Visit (HOSPITAL_COMMUNITY): Payer: Self-pay | Admitting: Nephrology

## 2016-03-15 DIAGNOSIS — N183 Chronic kidney disease, stage 3 unspecified: Secondary | ICD-10-CM

## 2016-04-01 ENCOUNTER — Other Ambulatory Visit: Payer: Self-pay | Admitting: *Deleted

## 2016-04-02 ENCOUNTER — Ambulatory Visit (HOSPITAL_COMMUNITY)
Admission: RE | Admit: 2016-04-02 | Discharge: 2016-04-02 | Disposition: A | Discharge status: Home / Self Care | Payer: Medicare Other | Source: Ambulatory Visit | Attending: Nephrology | Admitting: Nephrology

## 2016-04-02 DIAGNOSIS — N281 Cyst of kidney, acquired: Secondary | ICD-10-CM | POA: Insufficient documentation

## 2016-04-02 DIAGNOSIS — N183 Chronic kidney disease, stage 3 unspecified: Secondary | ICD-10-CM

## 2016-05-21 ENCOUNTER — Encounter: Payer: Self-pay | Admitting: Nurse Practitioner

## 2016-05-21 ENCOUNTER — Ambulatory Visit (INDEPENDENT_AMBULATORY_CARE_PROVIDER_SITE_OTHER): Payer: Medicare Other | Admitting: Nurse Practitioner

## 2016-05-21 VITALS — BP 128/62 | HR 60 | Temp 96.9°F | Ht 69.0 in | Wt 179.4 lb

## 2016-05-21 DIAGNOSIS — E1122 Type 2 diabetes mellitus with diabetic chronic kidney disease: Secondary | ICD-10-CM | POA: Diagnosis not present

## 2016-05-21 DIAGNOSIS — F5101 Primary insomnia: Secondary | ICD-10-CM | POA: Diagnosis not present

## 2016-05-21 DIAGNOSIS — I10 Essential (primary) hypertension: Secondary | ICD-10-CM

## 2016-05-21 DIAGNOSIS — I2510 Atherosclerotic heart disease of native coronary artery without angina pectoris: Secondary | ICD-10-CM | POA: Diagnosis not present

## 2016-05-21 DIAGNOSIS — N183 Chronic kidney disease, stage 3 (moderate): Secondary | ICD-10-CM | POA: Diagnosis not present

## 2016-05-21 DIAGNOSIS — I482 Chronic atrial fibrillation, unspecified: Secondary | ICD-10-CM

## 2016-05-21 DIAGNOSIS — IMO0001 Reserved for inherently not codable concepts without codable children: Secondary | ICD-10-CM

## 2016-05-21 DIAGNOSIS — G319 Degenerative disease of nervous system, unspecified: Secondary | ICD-10-CM | POA: Diagnosis not present

## 2016-05-21 DIAGNOSIS — Z6841 Body Mass Index (BMI) 40.0 and over, adult: Secondary | ICD-10-CM

## 2016-05-21 DIAGNOSIS — E785 Hyperlipidemia, unspecified: Secondary | ICD-10-CM

## 2016-05-21 DIAGNOSIS — I5022 Chronic systolic (congestive) heart failure: Secondary | ICD-10-CM

## 2016-05-21 DIAGNOSIS — E876 Hypokalemia: Secondary | ICD-10-CM | POA: Diagnosis not present

## 2016-05-21 DIAGNOSIS — E6609 Other obesity due to excess calories: Secondary | ICD-10-CM

## 2016-05-21 DIAGNOSIS — G4733 Obstructive sleep apnea (adult) (pediatric): Secondary | ICD-10-CM

## 2016-05-21 LAB — BAYER DCA HB A1C WAIVED: HB A1C (BAYER DCA - WAIVED): 6.4 % (ref <7.0)

## 2016-05-21 NOTE — Patient Instructions (Signed)
Fall Prevention in the Home Introduction Falls can cause injuries. They can happen to people of all ages. There are many things you can do to make your home safe and to help prevent falls. What can I do on the outside of my home?  Regularly fix the edges of walkways and driveways and fix any cracks.  Remove anything that might make you trip as you walk through a door, such as a raised step or threshold.  Trim any bushes or trees on the path to your home.  Use bright outdoor lighting.  Clear any walking paths of anything that might make someone trip, such as rocks or tools.  Regularly check to see if handrails are loose or broken. Make sure that both sides of any steps have handrails.  Any raised decks and porches should have guardrails on the edges.  Have any leaves, snow, or ice cleared regularly.  Use sand or salt on walking paths during winter.  Clean up any spills in your garage right away. This includes oil or grease spills. What can I do in the bathroom?  Use night lights.  Install grab bars by the toilet and in the tub and shower. Do not use towel bars as grab bars.  Use non-skid mats or decals in the tub or shower.  If you need to sit down in the shower, use a plastic, non-slip stool.  Keep the floor dry. Clean up any water that spills on the floor as soon as it happens.  Remove soap buildup in the tub or shower regularly.  Attach bath mats securely with double-sided non-slip rug tape.  Do not have throw rugs and other things on the floor that can make you trip. What can I do in the bedroom?  Use night lights.  Make sure that you have a light by your bed that is easy to reach.  Do not use any sheets or blankets that are too big for your bed. They should not hang down onto the floor.  Have a firm chair that has side arms. You can use this for support while you get dressed.  Do not have throw rugs and other things on the floor that can make you trip. What can  I do in the kitchen?  Clean up any spills right away.  Avoid walking on wet floors.  Keep items that you use a lot in easy-to-reach places.  If you need to reach something above you, use a strong step stool that has a grab bar.  Keep electrical cords out of the way.  Do not use floor polish or wax that makes floors slippery. If you must use wax, use non-skid floor wax.  Do not have throw rugs and other things on the floor that can make you trip. What can I do with my stairs?  Do not leave any items on the stairs.  Make sure that there are handrails on both sides of the stairs and use them. Fix handrails that are broken or loose. Make sure that handrails are as long as the stairways.  Check any carpeting to make sure that it is firmly attached to the stairs. Fix any carpet that is loose or worn.  Avoid having throw rugs at the top or bottom of the stairs. If you do have throw rugs, attach them to the floor with carpet tape.  Make sure that you have a light switch at the top of the stairs and the bottom of the stairs. If you   do not have them, ask someone to add them for you. What else can I do to help prevent falls?  Wear shoes that:  Do not have high heels.  Have rubber bottoms.  Are comfortable and fit you well.  Are closed at the toe. Do not wear sandals.  If you use a stepladder:  Make sure that it is fully opened. Do not climb a closed stepladder.  Make sure that both sides of the stepladder are locked into place.  Ask someone to hold it for you, if possible.  Clearly mark and make sure that you can see:  Any grab bars or handrails.  First and last steps.  Where the edge of each step is.  Use tools that help you move around (mobility aids) if they are needed. These include:  Canes.  Walkers.  Scooters.  Crutches.  Turn on the lights when you go into a dark area. Replace any light bulbs as soon as they burn out.  Set up your furniture so you have a  clear path. Avoid moving your furniture around.  If any of your floors are uneven, fix them.  If there are any pets around you, be aware of where they are.  Review your medicines with your doctor. Some medicines can make you feel dizzy. This can increase your chance of falling. Ask your doctor what other things that you can do to help prevent falls. This information is not intended to replace advice given to you by your health care provider. Make sure you discuss any questions you have with your health care provider. Document Released: 02/09/2009 Document Revised: 09/21/2015 Document Reviewed: 05/20/2014  2017 [employer]vier  

## 2016-05-21 NOTE — Progress Notes (Signed)
Subjective:    Patient ID: Taylor Jacobson, male    DOB: 14-Apr-1927, 83 y.o.   MRN: [id number]   Patient here today for follow up of chronic medical problems. Patient recently had cataract surgery and is doing well. Daughter says that he has a sore placeon left buttocks area.  Outpatient Encounter Prescriptions as of 05/21/2016  Medication Sig  . apixaban (ELIQUIS) 2.5 MG TABS tablet Take 1 tablet (2.5 mg total) by mouth 2 (two) times daily.  . calcium-vitamin D (OSCAL WITH D) 500-200 MG-UNIT tablet Take 2 tablets by mouth.  . Dextromethorphan Polistirex (ROBITUSSIN 12 HOUR COUGH PO) Take by mouth at bedtime.  . furosemide (LASIX) 40 MG tablet TAKE (1/2) TABLET TWICE DAILY.  Marland Kitchen levETIRAcetam (KEPPRA) 500 MG tablet Take 1 tablet (500 mg total) by mouth 2 (two) times daily.  Marland Kitchen lisinopril (PRINIVIL,ZESTRIL) 5 MG tablet Take 1 tablet (5 mg total) by mouth daily.  Marland Kitchen loratadine (CLARITIN) 10 MG tablet Take 1 tablet (10 mg total) by mouth daily.  . Multiple Vitamin (MULTIVITAMIN) tablet Take 1 tablet by mouth daily.  . potassium chloride SA (KLOR-CON M20) 20 MEQ tablet TAKE AS INSTRUCTED BY YOUR PRESCRIBER  . traZODone (DESYREL) 50 MG tablet Take 0.5-1 tablets (25-50 mg total) by mouth at bedtime as needed for sleep.   No facility-administered encounter medications on file as of 05/21/2016.      Hypertension  This is a chronic problem. The current episode started more than 1 year ago. The problem is unchanged. The problem is controlled. Pertinent negatives include no anxiety, peripheral edema or shortness of breath. Risk factors for coronary artery disease include diabetes mellitus, dyslipidemia, male gender and obesity. Past treatments include ACE inhibitors. There are no compliance problems.  Hypertensive end-organ damage includes CAD/MI, CVA and heart failure. There is no history of angina or kidney disease.  Hyperlipidemia  This is a chronic problem. The current episode started more than 1  year ago. Recent lipid tests were reviewed and are variable. Exacerbating diseases include diabetes and obesity. Pertinent negatives include no shortness of breath. Current antihyperlipidemic treatment includes statins. The current treatment provides mild improvement of lipids. There are no compliance problems.  Risk factors for coronary artery disease include diabetes mellitus, hypertension, male sex and obesity.  Diabetes  He presents for his follow-up diabetic visit. He has type 2 diabetes mellitus. MedicAlert identification noted. His disease course has been fluctuating. There are no hypoglycemic associated symptoms. Pertinent negatives for diabetes include no polydipsia, no polyphagia, no polyuria, no weakness and no weight loss. There are no hypoglycemic complications. Diabetic complications include a CVA. When asked about current treatments, none were reported. He is compliant with treatment most of the time. When asked about meal planning, he reported none. He has not had a previous visit with a dietitian. He never participates in exercise. Home blood sugar record trend: does not check blood sugars at home. An ACE inhibitor/angiotensin II receptor blocker is being taken. He does not see a podiatrist.Eye exam is not current.  CHF Lasix daily helps keep edema under control hypokalemia Takes chlor con daily- no c/o lower ext cramps Cerebral atrophy/ hx subdural hematoma/ seizures Is on Keppra- has not had any recent seizures. Chronic atrial fib On eliquis daily- no c/o bleeding problems. insomnia trazadone  Nightly for sleep- helps him relax better. Urinary urgency myrbetriq helps keep him from going to the restroom so often. Late effects CVA Doing well but has remained weak- had in house physical  therapy for 3 weeks then they said insurance would no longer pay for more.   Review of Systems  Constitutional: Negative.  Negative for weight loss.  HENT: Negative.   Respiratory: Negative.   Negative for shortness of breath.   Cardiovascular: Negative.   Endocrine: Negative for polydipsia, polyphagia and polyuria.  Neurological: Negative.  Negative for weakness.  Psychiatric/Behavioral: Negative.   All other systems reviewed and are negative.      Objective:   Physical Exam  Constitutional: He is oriented to person, place, and time. He appears well-developed and well-nourished.  Speech a  Little difficult to understand  HENT:  Head: Normocephalic.  Right Ear: External ear normal.  Left Ear: External ear normal.  Nose: Nose normal.  Mouth/Throat: Oropharynx is clear and moist.  Eyes: EOM are normal. Pupils are equal, round, and reactive to light.  Neck: Normal range of motion. Neck supple. No JVD present. No thyromegaly present.  Cardiovascular: Normal rate, regular rhythm, normal heart sounds and intact distal pulses.  Exam reveals no gallop and no friction rub.   No murmur heard. Pulmonary/Chest: Effort normal and breath sounds normal. No respiratory distress. He has no wheezes. He has no rales. He exhibits no tenderness.  Abdominal: Soft. Bowel sounds are normal. He exhibits no mass. There is no tenderness.  Genitourinary: Prostate normal and penis normal.  Musculoskeletal: Normal range of motion. He exhibits no edema.  Lymphadenopathy:    He has no cervical adenopathy.  Neurological: He is alert and oriented to person, place, and time. No cranial nerve deficit.  Skin: Skin is warm and dry.  Erythematous 5cm lesion on left buttocks- no drianage  Psychiatric: He has a normal mood and affect. His behavior is normal. Judgment and thought content normal.   BP 128/62   Pulse 60   Temp (!) 96.9 F (36.1 C) (Oral)   Ht '5\' 9"'$  (1.753 m)   Wt 179 lb 6.4 oz (81.4 kg)   BMI 26.49 kg/m     hgba1c 6.4%       Assessment & Plan:  1. Chronic atrial fibrillation ([place])  2. Coronary artery disease involving native heart without angina pectoris, unspecified vessel or  lesion type  3. Chronic systolic CHF (congestive heart failure) ([place])  4. Essential hypertension Low sodium diet - CMP14+EGFR  5. Obstructive sleep apnea O2 at night  6. Type 2 diabetes mellitus with stage 3 chronic kidney disease, without long-term current use of insulin (HCC) continue to watch carbs in diet - Bayer DCA Hb A1c Waived  7. Cerebral degeneration orient often  8. Hyperlipidemia, unspecified hyperlipidemia type Low fat diet - Lipid panel  9. Hypokalemia  10. Class 3 obesity due to excess calories with serious comorbidity and body mass index (BMI) of 45.0 to 49.9 in adult ([place])  11. Primary insomnia    Labs pending Health maintenance reviewed Diet and exercise encouraged Continue all meds Follow up  In 3 months   [place], FNP

## 2016-05-22 LAB — LIPID PANEL
CHOL/HDL RATIO: 3.9 ratio (ref 0.0–5.0)
Cholesterol, Total: 152 mg/dL (ref 100–199)
HDL: 39 mg/dL — ABNORMAL LOW (ref >39)
LDL CALC: 91 mg/dL (ref 0–99)
Triglycerides: 112 mg/dL (ref 0–149)
VLDL CHOLESTEROL CAL: 22 mg/dL (ref 5–40)

## 2016-05-22 LAB — CMP14+EGFR
ALBUMIN: 3.5 g/dL (ref 3.5–4.7)
ALK PHOS: 81 IU/L (ref 39–117)
ALT: 28 IU/L (ref 0–44)
AST: 25 IU/L (ref 0–40)
Albumin/Globulin Ratio: 0.9 — ABNORMAL LOW (ref 1.2–2.2)
BUN / CREAT RATIO: 15 (ref 10–24)
BUN: 32 mg/dL — AB (ref 8–27)
Bilirubin Total: 0.3 mg/dL (ref 0.0–1.2)
CO2: 29 mmol/L (ref 18–29)
CREATININE: 2.13 mg/dL — AB (ref 0.76–1.27)
Calcium: 8.7 mg/dL (ref 8.6–10.2)
Chloride: 100 mmol/L (ref 96–106)
GFR, EST AFRICAN AMERICAN: 31 mL/min/{1.73_m2} — AB (ref >59)
GFR, EST NON AFRICAN AMERICAN: 27 mL/min/{1.73_m2} — AB (ref >59)
GLOBULIN, TOTAL: 3.8 g/dL (ref 1.5–4.5)
GLUCOSE: 81 mg/dL (ref 65–99)
Potassium: 5.1 mmol/L (ref 3.5–5.2)
SODIUM: 143 mmol/L (ref 134–144)
TOTAL PROTEIN: 7.3 g/dL (ref 6.0–8.5)

## 2016-06-06 ENCOUNTER — Other Ambulatory Visit: Payer: Self-pay | Admitting: Nurse Practitioner

## 2016-06-06 ENCOUNTER — Encounter: Payer: Self-pay | Admitting: *Deleted

## 2016-06-27 DEATH — deceased

## 2016-08-21 ENCOUNTER — Ambulatory Visit: Payer: Medicare Other | Admitting: Nurse Practitioner
